# Patient Record
Sex: Female | Born: 1985 | Race: Black or African American | Hispanic: No | Marital: Single | Smoking: Former smoker
Health system: Southern US, Community
[De-identification: ages and names within clinical notes are randomized; demographics above are authoritative.]

## PROBLEM LIST (undated history)

## (undated) ENCOUNTER — Inpatient Hospital Stay (HOSPITAL_COMMUNITY): Payer: Self-pay

## (undated) DIAGNOSIS — R87629 Unspecified abnormal cytological findings in specimens from vagina: Secondary | ICD-10-CM

## (undated) DIAGNOSIS — Z8759 Personal history of other complications of pregnancy, childbirth and the puerperium: Secondary | ICD-10-CM

## (undated) DIAGNOSIS — IMO0002 Reserved for concepts with insufficient information to code with codable children: Secondary | ICD-10-CM

## (undated) DIAGNOSIS — D573 Sickle-cell trait: Secondary | ICD-10-CM

## (undated) DIAGNOSIS — R87619 Unspecified abnormal cytological findings in specimens from cervix uteri: Secondary | ICD-10-CM

## (undated) DIAGNOSIS — N83209 Unspecified ovarian cyst, unspecified side: Secondary | ICD-10-CM

## (undated) DIAGNOSIS — F32A Depression, unspecified: Secondary | ICD-10-CM

## (undated) DIAGNOSIS — Z86718 Personal history of other venous thrombosis and embolism: Secondary | ICD-10-CM

## (undated) DIAGNOSIS — N12 Tubulo-interstitial nephritis, not specified as acute or chronic: Secondary | ICD-10-CM

## (undated) DIAGNOSIS — D649 Anemia, unspecified: Secondary | ICD-10-CM

## (undated) DIAGNOSIS — F329 Major depressive disorder, single episode, unspecified: Secondary | ICD-10-CM

## (undated) DIAGNOSIS — Z8679 Personal history of other diseases of the circulatory system: Secondary | ICD-10-CM

## (undated) HISTORY — DX: Sickle-cell trait: D57.3

## (undated) HISTORY — DX: Tubulo-interstitial nephritis, not specified as acute or chronic: N12

## (undated) HISTORY — DX: Personal history of other diseases of the circulatory system: Z86.79

## (undated) HISTORY — PX: DILATION AND CURETTAGE OF UTERUS: SHX78

## (undated) HISTORY — DX: Personal history of other complications of pregnancy, childbirth and the puerperium: Z87.59

## (undated) HISTORY — PX: CERVIX LESION DESTRUCTION: SHX591

## (undated) HISTORY — DX: Depression, unspecified: F32.A

---
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
## 2003-07-03 ENCOUNTER — Inpatient Hospital Stay (HOSPITAL_COMMUNITY): Admission: AD | Admit: 2003-07-03 | Discharge: 2003-07-07 | Payer: Self-pay | Admitting: Obstetrics

## 2003-07-14 ENCOUNTER — Inpatient Hospital Stay (HOSPITAL_COMMUNITY): Admission: AD | Admit: 2003-07-14 | Discharge: 2003-07-14 | Payer: Self-pay | Admitting: Obstetrics

## 2005-04-14 ENCOUNTER — Inpatient Hospital Stay (HOSPITAL_COMMUNITY): Admission: AD | Admit: 2005-04-14 | Discharge: 2005-04-15 | Payer: Self-pay | Admitting: Obstetrics

## 2005-07-31 ENCOUNTER — Inpatient Hospital Stay (HOSPITAL_COMMUNITY): Admission: AD | Admit: 2005-07-31 | Discharge: 2005-08-02 | Payer: Self-pay | Admitting: Obstetrics

## 2006-06-08 ENCOUNTER — Emergency Department (HOSPITAL_COMMUNITY): Admission: EM | Admit: 2006-06-08 | Discharge: 2006-06-08 | Payer: Self-pay | Admitting: Emergency Medicine

## 2006-06-10 ENCOUNTER — Emergency Department (HOSPITAL_COMMUNITY): Admission: EM | Admit: 2006-06-10 | Discharge: 2006-06-10 | Payer: Self-pay | Admitting: Emergency Medicine

## 2008-10-11 ENCOUNTER — Inpatient Hospital Stay (HOSPITAL_COMMUNITY): Admission: AD | Admit: 2008-10-11 | Discharge: 2008-10-11 | Payer: Self-pay | Admitting: Obstetrics

## 2008-10-19 ENCOUNTER — Emergency Department (HOSPITAL_COMMUNITY): Admission: EM | Admit: 2008-10-19 | Discharge: 2008-10-19 | Payer: Self-pay | Admitting: Emergency Medicine

## 2008-12-02 ENCOUNTER — Emergency Department (HOSPITAL_COMMUNITY): Admission: EM | Admit: 2008-12-02 | Discharge: 2008-12-02 | Payer: Self-pay | Admitting: Emergency Medicine

## 2010-06-10 ENCOUNTER — Inpatient Hospital Stay (HOSPITAL_COMMUNITY): Admission: AD | Admit: 2010-06-10 | Discharge: 2010-06-10 | Payer: Self-pay | Admitting: Obstetrics

## 2010-06-10 ENCOUNTER — Ambulatory Visit: Payer: Self-pay | Admitting: Nurse Practitioner

## 2010-08-25 DIAGNOSIS — N83209 Unspecified ovarian cyst, unspecified side: Secondary | ICD-10-CM

## 2010-08-25 HISTORY — DX: Unspecified ovarian cyst, unspecified side: N83.209

## 2010-11-06 LAB — WET PREP, GENITAL: Trich, Wet Prep: NONE SEEN

## 2010-11-06 LAB — URINALYSIS, ROUTINE W REFLEX MICROSCOPIC
Bilirubin Urine: NEGATIVE
Glucose, UA: NEGATIVE mg/dL
Ketones, ur: 15 mg/dL — AB
Nitrite: NEGATIVE
Protein, ur: NEGATIVE mg/dL
Specific Gravity, Urine: 1.025 (ref 1.005–1.030)
Urobilinogen, UA: 0.2 mg/dL (ref 0.0–1.0)
pH: 5.5 (ref 5.0–8.0)

## 2010-11-06 LAB — URINE MICROSCOPIC-ADD ON

## 2010-11-06 LAB — DIFFERENTIAL
Basophils Absolute: 0 10*3/uL (ref 0.0–0.1)
Basophils Relative: 0 % (ref 0–1)
Eosinophils Absolute: 0 10*3/uL (ref 0.0–0.7)
Eosinophils Relative: 0 % (ref 0–5)
Lymphocytes Relative: 22 % (ref 12–46)
Lymphs Abs: 1.6 10*3/uL (ref 0.7–4.0)
Monocytes Absolute: 0.7 10*3/uL (ref 0.1–1.0)
Monocytes Relative: 9 % (ref 3–12)
Neutro Abs: 5 10*3/uL (ref 1.7–7.7)
Neutrophils Relative %: 69 % (ref 43–77)

## 2010-11-06 LAB — CBC
HCT: 33.8 % — ABNORMAL LOW (ref 36.0–46.0)
Hemoglobin: 11.4 g/dL — ABNORMAL LOW (ref 12.0–15.0)
MCH: 31.2 pg (ref 26.0–34.0)
MCHC: 33.6 g/dL (ref 30.0–36.0)
MCV: 92.8 fL (ref 78.0–100.0)
Platelets: 201 10*3/uL (ref 150–400)
RBC: 3.65 MIL/uL — ABNORMAL LOW (ref 3.87–5.11)
RDW: 14.9 % (ref 11.5–15.5)
WBC: 7.3 10*3/uL (ref 4.0–10.5)

## 2010-11-06 LAB — POCT PREGNANCY, URINE: Preg Test, Ur: NEGATIVE

## 2010-11-06 LAB — GC/CHLAMYDIA PROBE AMP, GENITAL
Chlamydia, DNA Probe: NEGATIVE
GC Probe Amp, Genital: NEGATIVE

## 2010-12-04 LAB — DIFFERENTIAL
Basophils Relative: 0 % (ref 0–1)
Eosinophils Absolute: 0 10*3/uL (ref 0.0–0.7)
Monocytes Relative: 6 % (ref 3–12)
Neutro Abs: 10.4 10*3/uL — ABNORMAL HIGH (ref 1.7–7.7)
Neutrophils Relative %: 87 % — ABNORMAL HIGH (ref 43–77)

## 2010-12-04 LAB — CBC
MCHC: 33.5 g/dL (ref 30.0–36.0)
MCV: 87.7 fL (ref 78.0–100.0)
Platelets: 245 10*3/uL (ref 150–400)
RBC: 3.64 MIL/uL — ABNORMAL LOW (ref 3.87–5.11)
WBC: 11.9 10*3/uL — ABNORMAL HIGH (ref 4.0–10.5)

## 2010-12-04 LAB — URINALYSIS, ROUTINE W REFLEX MICROSCOPIC
Bilirubin Urine: NEGATIVE
Glucose, UA: NEGATIVE mg/dL
Nitrite: POSITIVE — AB
Protein, ur: 100 mg/dL — AB
Specific Gravity, Urine: 1.011 (ref 1.005–1.030)
Urobilinogen, UA: 1 mg/dL (ref 0.0–1.0)
pH: 6 (ref 5.0–8.0)

## 2010-12-04 LAB — URINE MICROSCOPIC-ADD ON

## 2010-12-04 LAB — POCT I-STAT, CHEM 8
Chloride: 102 mEq/L (ref 96–112)
HCT: 34 % — ABNORMAL LOW (ref 36.0–46.0)
Hemoglobin: 11.6 g/dL — ABNORMAL LOW (ref 12.0–15.0)
Potassium: 3.4 mEq/L — ABNORMAL LOW (ref 3.5–5.1)
Sodium: 136 mEq/L (ref 135–145)

## 2010-12-04 LAB — URINE CULTURE: Colony Count: >=100000

## 2010-12-04 LAB — POCT PREGNANCY, URINE: Preg Test, Ur: NEGATIVE

## 2010-12-10 LAB — URINALYSIS, ROUTINE W REFLEX MICROSCOPIC
Bilirubin Urine: NEGATIVE
Hgb urine dipstick: NEGATIVE
Ketones, ur: NEGATIVE mg/dL
Nitrite: NEGATIVE
Protein, ur: NEGATIVE mg/dL
Specific Gravity, Urine: 1.02 (ref 1.005–1.030)
Specific Gravity, Urine: 1.013 (ref 1.005–1.030)
Urobilinogen, UA: 0.2 mg/dL (ref 0.0–1.0)
Urobilinogen, UA: 1 mg/dL (ref 0.0–1.0)
pH: 6.5 (ref 5.0–8.0)

## 2010-12-10 LAB — CBC
HCT: 33.8 % — ABNORMAL LOW (ref 36.0–46.0)
HCT: 37.1 % (ref 36.0–46.0)
MCHC: 32.5 g/dL (ref 30.0–36.0)
MCHC: 32.8 g/dL (ref 30.0–36.0)
MCV: 89.3 fL (ref 78.0–100.0)
MCV: 88.6 fL (ref 78.0–100.0)
Platelets: 226 10*3/uL (ref 150–400)
RBC: 4.19 MIL/uL (ref 3.87–5.11)
RDW: 16.2 % — ABNORMAL HIGH (ref 11.5–15.5)
WBC: 6.7 10*3/uL (ref 4.0–10.5)
WBC: 8 10*3/uL (ref 4.0–10.5)

## 2010-12-10 LAB — LIPASE, BLOOD: Lipase: 18 U/L (ref 11–59)

## 2010-12-10 LAB — DIFFERENTIAL
Basophils Absolute: 0 10*3/uL (ref 0.0–0.1)
Basophils Relative: 0 % (ref 0–1)
Eosinophils Absolute: 0 10*3/uL (ref 0.0–0.7)
Eosinophils Relative: 0 % (ref 0–5)
Lymphocytes Relative: 23 % (ref 12–46)
Lymphs Abs: 2 10*3/uL (ref 0.7–4.0)
Neutro Abs: 5.9 10*3/uL (ref 1.7–7.7)
Neutrophils Relative %: 62 % (ref 43–77)
Neutrophils Relative %: 73 % (ref 43–77)

## 2010-12-10 LAB — GC/CHLAMYDIA PROBE AMP, GENITAL: GC Probe Amp, Genital: NEGATIVE

## 2010-12-10 LAB — COMPREHENSIVE METABOLIC PANEL
BUN: 6 mg/dL (ref 6–23)
CO2: 19 mEq/L (ref 19–32)
Chloride: 104 mEq/L (ref 96–112)
Creatinine, Ser: 0.71 mg/dL (ref 0.4–1.2)
GFR calc non Af Amer: >60 mL/min (ref >60)
Total Bilirubin: 0.9 mg/dL (ref 0.3–1.2)

## 2010-12-10 LAB — WET PREP, GENITAL
Trich, Wet Prep: NONE SEEN
Yeast Wet Prep HPF POC: NONE SEEN

## 2010-12-10 LAB — POCT PREGNANCY, URINE: Preg Test, Ur: NEGATIVE

## 2011-01-10 NOTE — H&P (Signed)
   NAME:  Linda Duffy, Linda Duffy                        ACCOUNT NO.:  [id number]   MEDICAL RECORD NO.:  [id number]                   PATIENT TYPE:  INP   LOCATION:  9113                                 FACILITY:  [facility]   PHYSICIAN:  Kathreen Cosier, M.D.           DATE OF BIRTH:  18-Aug-1986   DATE OF ADMISSION:  07/03/2003  DATE OF DISCHARGE:                                HISTORY & PHYSICAL   REASON FOR ADMISSION:  The patient is an 25 year old primigravida, [name]  June 30, 2003 who was admitted for induction.  Negative GBS.  Cervix 1  cm, 60%, vertex, -3.  She was contracting every four minutes.  She was not  feeling them.   She was started on low dose Pitocin on 12:30 p.m. on November 8.  At 4:45  p.m. she was on 14 milliunits of Pitocin, not feeling her contractions.  She  was contracting every two minutes.  She had a reactive tracing.  Membranes  were ruptured artificially.  The cervix was 2-3 cm, 70%, vertex -3.  By 8  p.m. she was 4-5 cm, 100%, contracting every two to three minutes, and the  vertex was -3 station.  By 11 p.m. cervix was still 4-5.  She was  contracting every two to three minutes with adequate contractions and the  cervix was unchanged.  By 1:35 a.m. she was still 4-5 cm with the vertex -3  with molding and it was decided she would be delivered by cesarean section  with nonreassuring fetal heart tracing.   While waiting in the operating room she received terbutaline 0.25  subcutaneous and Amnioinfusion ordered if necessary because she was having  variable decelerations.   PHYSICAL EXAMINATION:  GENERAL:  Well-developed female in labor.  HEENT:  Negative.  LUNGS:  Clear.  HEART:  Regular rhythm.  No murmurs.  No gallops.  ABDOMEN:  Term size.  Estimated weight was 6 pounds 12 ounces.  PELVIC:  Gravid.  EXTREMITIES:  Lower extremities negative.   ADDENDUM:  By 3 a.m. the patient's temperature was 101 and she was started  on ampicillin 2 g IV q.6h.                                           Kathreen Cosier, M.D.    BAM/MEDQ  D:  07/04/2003  T:  07/04/2003  Job:  [id number]

## 2011-01-10 NOTE — Discharge Summary (Signed)
   NAME:  Linda Duffy, Linda Duffy                        ACCOUNT NO.:  [id number]   MEDICAL RECORD NO.:  [id number]                   PATIENT TYPE:  INP   LOCATION:  9113                                 [facility]  [facility]   PHYSICIAN:  Kathreen Cosier, M.D.           DATE OF BIRTH:  06/10/1986   DATE OF ADMISSION:  07/03/2003  DATE OF DISCHARGE:                                 DISCHARGE SUMMARY   HISTORY OF PRESENT ILLNESS:  The patient is an 25 year old primigravida, [facility]  June 30, 2003 who was admitted for induction at the patient's request.  Estimated fetal weight of 6 pounds 12 ounces.  She was 1 cm, 60% with the  vertex at -3 station.  She received low dose Pitocin and was in adequate  labor for greater than six hours.  She progressed to 4-5 cm with the vertex  at -3 station and underwent primary low transverse cesarean section because  of failure to progress.  She also spiked a temperature of 101 and was  treated with ampicillin prior to cesarean section.  She underwent a primary  low transverse cesarean section and had a female Apgar 8/9 weighing 7 pounds  5 ounces from the OP position.  Postoperatively she did well.  She was  discharged on the third postoperative day, ambulatory, on a regular diet.  Her hemoglobin was 11.3.  She was discharged on Micronor and Tylox to see me  in six weeks.   DISCHARGE DIAGNOSES:  Status post primary low transverse cesarean section at  term for failure to progress in labor.                                               Kathreen Cosier, M.D.    BAM/MEDQ  D:  07/07/2003  T:  07/07/2003  Job:  [id number]

## 2011-01-10 NOTE — Discharge Summary (Signed)
Linda Duffy, GILLOOLY                 ACCOUNT NO.:  [id number]   MEDICAL RECORD NO.:  [id number]          PATIENT TYPE:  INP   LOCATION:  9303                          [facility]  [facility]   PHYSICIAN:  Kathreen Cosier, M.D.DATE OF BIRTH:  1985-10-22   DATE OF ADMISSION:  07/31/2005  DATE OF DISCHARGE:  08/02/2005                                 DISCHARGE SUMMARY   Patient is a 25 year old gravida 2, para 1-0-1-1 on Depo Provera  contraception.  She was admitted because of right upper quadrant pain.  She  had been on ampicillin and Tylenol No.3 and she had on admission a positive  urine.  She also had nausea, vomiting on admission.  She was started on IV  ampicillin and gentamicin and she remained afebrile and her pain subsided.  By December 9 her CVA tenderness was negative.  Abdomen was soft, nontender.  White count 7.9.  Urine 7-10 white blood cells, 7-12 red blood cells.  GC,  Chlamydia negative.  Hemoglobin 12.1.  She was discharged home on December 9  on Cleocin 300 p.o. q.6h. and Tylox for pain to see me in two weeks.   DISCHARGE DIAGNOSES:  Status post hospitalization for pyelonephritis.           ______________________________  Kathreen Cosier, M.D.     BAM/MEDQ  D:  08/20/2005  T:  08/20/2005  Job:  [id number]

## 2011-01-10 NOTE — Op Note (Signed)
   NAME:  Linda Duffy, Linda Duffy                        ACCOUNT NO.:  [id number]   MEDICAL RECORD NO.:  [id number]                   PATIENT TYPE:  INP   LOCATION:  9113                                 [facility]  [facility]   PHYSICIAN:  Kathreen Cosier, M.D.           DATE OF BIRTH:  [birth year]   DATE OF PROCEDURE:  07/04/2003  DATE OF DISCHARGE:                                 OPERATIVE REPORT   PREOPERATIVE DIAGNOSIS:  Failure to progress in labor.   SURGEON:  Kathreen Cosier, M.D.   ANESTHESIA:  Epidural.   DESCRIPTION OF PROCEDURE:  The patient was placed on the operating table in  supine position.  Abdomen prepped and draped, bladder emptied with a Foley  catheter.  A transverse suprapubic incision made and carried down to the  rectus fascia.  The fascia cleaned and incised the length of the incision.  The recti muscles retracted laterally, the peritoneum incised  longitudinally.  A transverse incision made in the visceral peritoneum above  the bladder and the bladder mobilized inferiorly.  A transverse lower  uterine incision made.  The patient delivered from the OP position of a  female, Apgar 8 and 9, weighing 7 pounds 5 ounces.  The team was in  attendance.  There was a nuchal cord reduced before delivery.  The placenta  was anterior, removed manually, and the uterine cavity cleaned with dry  laps.  The uterine incision closed in one layer with continuous suture of #1  chromic.  Hemostasis was satisfactory.  The bladder flap reattached with 2-0  chromic.  The uterus was well-contracted, tubes and ovaries normal.  Abdomen  closed in layers, the peritoneum with continuous suture of 0 chromic, the  fascia with continuous suture of 0 Dexon, and the skin closed with  subcuticular stitch of 3-0 Monocryl.  Blood loss 500 mL.  The patient  tolerated the procedure well, was taken to the recovery room in good  condition.                                               Kathreen Cosier, M.D.    BAM/MEDQ  D:  07/04/2003  T:  07/04/2003  Job:  [id number]

## 2011-04-11 ENCOUNTER — Inpatient Hospital Stay (HOSPITAL_COMMUNITY): Payer: Self-pay

## 2011-04-11 ENCOUNTER — Encounter (HOSPITAL_COMMUNITY): Payer: Self-pay | Admitting: *Deleted

## 2011-04-11 ENCOUNTER — Inpatient Hospital Stay (HOSPITAL_COMMUNITY)
Admission: AD | Admit: 2011-04-11 | Discharge: 2011-04-11 | Disposition: A | Discharge status: Home / Self Care | Payer: Self-pay | Source: Ambulatory Visit | Attending: Obstetrics & Gynecology | Admitting: Obstetrics & Gynecology

## 2011-04-11 DIAGNOSIS — N390 Urinary tract infection, site not specified: Secondary | ICD-10-CM | POA: Diagnosis present

## 2011-04-11 DIAGNOSIS — Z3490 Encounter for supervision of normal pregnancy, unspecified, unspecified trimester: Secondary | ICD-10-CM

## 2011-04-11 DIAGNOSIS — N760 Acute vaginitis: Secondary | ICD-10-CM | POA: Diagnosis present

## 2011-04-11 DIAGNOSIS — B9689 Other specified bacterial agents as the cause of diseases classified elsewhere: Secondary | ICD-10-CM | POA: Insufficient documentation

## 2011-04-11 DIAGNOSIS — M549 Dorsalgia, unspecified: Secondary | ICD-10-CM | POA: Insufficient documentation

## 2011-04-11 DIAGNOSIS — Z3480 Encounter for supervision of other normal pregnancy, unspecified trimester: Secondary | ICD-10-CM

## 2011-04-11 DIAGNOSIS — O2390 Unspecified genitourinary tract infection in pregnancy, unspecified trimester: Secondary | ICD-10-CM | POA: Insufficient documentation

## 2011-04-11 DIAGNOSIS — R1031 Right lower quadrant pain: Secondary | ICD-10-CM | POA: Insufficient documentation

## 2011-04-11 DIAGNOSIS — A499 Bacterial infection, unspecified: Secondary | ICD-10-CM | POA: Insufficient documentation

## 2011-04-11 HISTORY — DX: Unspecified ovarian cyst, unspecified side: N83.209

## 2011-04-11 HISTORY — DX: Unspecified abnormal cytological findings in specimens from cervix uteri: R87.619

## 2011-04-11 HISTORY — DX: Reserved for concepts with insufficient information to code with codable children: IMO0002

## 2011-04-11 LAB — URINE MICROSCOPIC-ADD ON

## 2011-04-11 LAB — POCT PREGNANCY, URINE: Preg Test, Ur: POSITIVE

## 2011-04-11 LAB — CBC
MCV: 91.6 fL (ref 78.0–100.0)
Platelets: 157 10*3/uL (ref 150–400)
RDW: 13.8 % (ref 11.5–15.5)
WBC: 8.5 10*3/uL (ref 4.0–10.5)

## 2011-04-11 LAB — URINALYSIS, ROUTINE W REFLEX MICROSCOPIC
Protein, ur: 100 mg/dL — AB
Urobilinogen, UA: 0.2 mg/dL (ref 0.0–1.0)

## 2011-04-11 LAB — WET PREP, GENITAL

## 2011-04-11 MED ORDER — METRONIDAZOLE 500 MG PO TABS: 500.0000 mg | ORAL_TABLET | Freq: Two times a day (BID) | ORAL | Status: AC

## 2011-04-11 MED ORDER — CEFTRIAXONE SODIUM 1 G IJ SOLR
500.0000 mg | Freq: Once | INTRAMUSCULAR | Status: DC
  Filled 2011-04-11: qty 1

## 2011-04-11 MED ORDER — CEPHALEXIN 500 MG PO CAPS: 500.0000 mg | ORAL_CAPSULE | Freq: Three times a day (TID) | ORAL | Status: AC

## 2011-04-11 MED ORDER — CEFTRIAXONE SODIUM 1 G IJ SOLR
1.0000 g | Freq: Once | INTRAMUSCULAR | Status: AC
  Administered 2011-04-11: 1 g via INTRAMUSCULAR

## 2011-04-11 MED ORDER — HYDROMORPHONE HCL 1 MG/ML IJ SOLN
1.0000 mg | Freq: Once | INTRAMUSCULAR | Status: AC
  Administered 2011-04-11: 1 mg via INTRAMUSCULAR
  Filled 2011-04-11: qty 1

## 2011-04-11 MED ORDER — PHENAZOPYRIDINE HCL 200 MG PO TABS: 200.0000 mg | ORAL_TABLET | Freq: Three times a day (TID) | ORAL | Status: AC | PRN

## 2011-04-11 NOTE — Progress Notes (Signed)
Waiting on pt to have pain meds before taking to [place].

## 2011-04-11 NOTE — Progress Notes (Signed)
PT SAYS CYCLES ARE VERY IRREG

## 2011-04-11 NOTE — Progress Notes (Signed)
Pt presents to [facility] for c/o pain in lower right side.  States also some back pain.  States she drives a stick shift and was unable to even drive to get here.  Started around 10pm

## 2011-04-11 NOTE — Progress Notes (Signed)
N. Frazier, CNM at bedside.  Assessment done and poc discussed with pt.  

## 2011-04-11 NOTE — ED Provider Notes (Signed)
History     Chief Complaint  Patient presents with  . Back Pain   HPI  Linda Duffy  is a 25 y.o. [id number] presenting with RLQ and back pain with sudden onset tonight. No vaginal bleeding or discharge. Irregular periods. LMP May. Sexually active.   OB History    Grav Para Term Preterm Abortions TAB SAB Ect Mult Living   3 1 1  2 2    1       Past Medical History  Diagnosis Date  . Ovarian cyst   . Abnormal Pap smear     Past Surgical History  Procedure Date  . No past surgeries     No family history on file.  History  Substance Use Topics  . Smoking status: Current Everyday Smoker -- 0.2 packs/day  . Smokeless tobacco: Not on file  . Alcohol Use: Yes    Allergies: No Known Allergies  No prescriptions prior to admission    Review of Systems  Constitutional: Negative.   Respiratory: Negative.   Cardiovascular: Negative.   Gastrointestinal: Positive for abdominal pain. Negative for nausea, vomiting, diarrhea and constipation.  Genitourinary: Positive for urgency and flank pain. Negative for dysuria, frequency and hematuria.       Negative for vaginal bleeding, cramping/contractions  Musculoskeletal: Negative for back pain.  Neurological: Negative.   Psychiatric/Behavioral: Negative.    Physical Exam   Blood pressure 111/48, pulse 73, temperature 98.1 F (36.7 C), temperature source Oral, resp. rate 18, height 5\' 5"  (1.651 m), weight 57.834 kg (127 lb 8 oz), last menstrual period 01/04/2011.  Physical Exam  Constitutional: She appears well-developed and well-nourished. She appears distressed.  Cardiovascular: Normal rate.   Respiratory: Effort normal.  GI: Soft. She exhibits no distension and no mass. There is tenderness. There is guarding and CVA tenderness (right). There is no rebound.  Genitourinary: There is no rash or lesion on the right labia. There is no rash or lesion on the left labia. Uterus is enlarged (difficult to examine d/t gaurding and pt  discomfort, ? 16-17 week size). Right adnexum displays tenderness. Left adnexum displays no tenderness. No tenderness or bleeding around the vagina. No vaginal discharge found.    MAU Course  Procedures  Results for orders placed during the hospital encounter of 04/11/11 (from the past 24 hour(s))  URINALYSIS, ROUTINE W REFLEX MICROSCOPIC     Status: Abnormal   Collection Time   04/11/11  1:57 AM      Component Value Range   Color, Urine YELLOW  YELLOW    Appearance CLEAR  CLEAR    Specific Gravity, Urine 1.020  1.005 - 1.030    pH 6.0  5.0 - 8.0    Glucose, UA NEGATIVE  NEGATIVE (mg/dL)   Hgb urine dipstick LARGE (*) NEGATIVE    Bilirubin Urine NEGATIVE  NEGATIVE    Ketones, ur NEGATIVE  NEGATIVE (mg/dL)   Protein, ur 454 (*) NEGATIVE (mg/dL)   Urobilinogen, UA 0.2  0.0 - 1.0 (mg/dL)   Nitrite POSITIVE (*) NEGATIVE    Leukocytes, UA MODERATE (*) NEGATIVE   URINE MICROSCOPIC-ADD ON     Status: Abnormal   Collection Time   04/11/11  1:57 AM      Component Value Range   Squamous Epithelial / LPF FEW (*) RARE    WBC, UA TOO NUMEROUS TO COUNT  <3 (WBC/hpf)   RBC / HPF TOO NUMEROUS TO COUNT  <3 (RBC/hpf)   Bacteria, UA MANY (*) RARE   POCT  PREGNANCY, URINE     Status: Normal   Collection Time   04/11/11  2:10 AM      Component Value Range   Preg Test, Ur POSITIVE    CBC     Status: Abnormal   Collection Time   04/11/11  2:25 AM      Component Value Range   WBC 8.5  4.0 - 10.5 (K/uL)   RBC 2.98 (*) 3.87 - 5.11 (MIL/uL)   Hemoglobin 9.4 (*) 12.0 - 15.0 (g/dL)   HCT 40.9 (*) 81.1 - 46.0 (%)   MCV 91.6  78.0 - 100.0 (fL)   MCH 31.5  26.0 - 34.0 (pg)   MCHC 34.4  30.0 - 36.0 (g/dL)   RDW 91.4  78.2 - 95.6 (%)   Platelets 157  150 - 400 (K/uL)  ABO/RH     Status: Normal   Collection Time   04/11/11  2:25 AM      Component Value Range   ABO/RH(D) O POS    HCG, QUANTITATIVE, PREGNANCY     Status: Abnormal   Collection Time   04/11/11  2:25 AM      Component Value Range   hCG,  Beta Chain, Quant, S 21308 (*) <5 (mIU/mL)  WET PREP, GENITAL     Status: Abnormal   Collection Time   04/11/11  2:55 AM      Component Value Range   Yeast, Wet Prep NONE SEEN  NONE SEEN    Trich, Wet Prep NONE SEEN  NONE SEEN    Clue Cells, Wet Prep MODERATE (*) NONE SEEN    WBC, Wet Prep HPF POC FEW (*) NONE SEEN    Urine culture pending  Consulted with Dr. Marice Potter - ok to d/c to home with pyelo precautions  Assessment and Plan  25 y.o. G4P1021 at 16.[redacted] wks EGA UTI - Rocephin IM in MAU, rx Keflex and pyridium BV - rx Flagyl Start prenatal care ASAP  [facility] 04/11/2011, 4:09 AM

## 2011-04-11 NOTE — Progress Notes (Signed)
SSE done per Telford Nab, CNM.  VE done.

## 2011-04-11 NOTE — Progress Notes (Signed)
SAYS PAIN STARTED AT 1030 PM TONIGHT -  PAIN IS LOWER R SSIDE  AND BACK- PUT HOT COMPRESS AND COLD- DID NOT HELP. DID NOT TAKE ANY MED.

## 2011-04-12 LAB — GC/CHLAMYDIA PROBE AMP, GENITAL: GC Probe Amp, Genital: NEGATIVE

## 2012-05-19 ENCOUNTER — Encounter (HOSPITAL_COMMUNITY): Payer: Self-pay | Admitting: *Deleted

## 2012-05-19 ENCOUNTER — Inpatient Hospital Stay (HOSPITAL_COMMUNITY)
Admission: AD | Admit: 2012-05-19 | Discharge: 2012-05-20 | Disposition: A | Discharge status: Home / Self Care | Payer: Self-pay | Source: Ambulatory Visit | Attending: Obstetrics & Gynecology | Admitting: Obstetrics & Gynecology

## 2012-05-19 DIAGNOSIS — N760 Acute vaginitis: Secondary | ICD-10-CM | POA: Insufficient documentation

## 2012-05-19 DIAGNOSIS — R109 Unspecified abdominal pain: Secondary | ICD-10-CM | POA: Insufficient documentation

## 2012-05-19 DIAGNOSIS — A499 Bacterial infection, unspecified: Secondary | ICD-10-CM | POA: Insufficient documentation

## 2012-05-19 DIAGNOSIS — R55 Syncope and collapse: Secondary | ICD-10-CM | POA: Insufficient documentation

## 2012-05-19 DIAGNOSIS — N946 Dysmenorrhea, unspecified: Secondary | ICD-10-CM | POA: Insufficient documentation

## 2012-05-19 DIAGNOSIS — B9689 Other specified bacterial agents as the cause of diseases classified elsewhere: Secondary | ICD-10-CM | POA: Insufficient documentation

## 2012-05-19 HISTORY — DX: Anemia, unspecified: D64.9

## 2012-05-19 LAB — URINALYSIS, ROUTINE W REFLEX MICROSCOPIC
Ketones, ur: NEGATIVE mg/dL
Leukocytes, UA: NEGATIVE
Nitrite: NEGATIVE
Specific Gravity, Urine: 1.01 (ref 1.005–1.030)
pH: 7 (ref 5.0–8.0)

## 2012-05-19 LAB — CBC
Platelets: 182 10*3/uL (ref 150–400)
RBC: 3.68 MIL/uL — ABNORMAL LOW (ref 3.87–5.11)
WBC: 7.6 10*3/uL (ref 4.0–10.5)

## 2012-05-19 MED ORDER — SODIUM CHLORIDE 0.9 % IV SOLN
1000.0000 mL | Freq: Once | INTRAVENOUS | Status: AC
  Administered 2012-05-20: 1000 mL via INTRAVENOUS

## 2012-05-19 NOTE — MAU Provider Note (Signed)
History     CSN: [id number]  Arrival date and time: 05/19/12 2158   None     Chief Complaint  Patient presents with  . Loss of Consciousness   HPI Linda Duffy is a 26 y.o. female who presents to MAU with syncopal episodes. The first episode was 2 weeks ago. She states she was getting ready to get something to drink and felt dizzy and passed out. Was fine after that. Thought she waited to long to eat. The next episode was about a week ago while driving felt hot and sweaty and pulled in to a gas station but did not pass out. Got water and sat for a while then felt ok. For the past 3 days has had a headache and felt dizzy like may pass out again so decided to come in. Also complains of lower abdominal cramping. Period due Oct. 1 st. Not on any birth control. Current sex partner x 1 year. No history of STI's. The history was provided by the patient.   OB History    Grav Para Term Preterm Abortions TAB SAB Ect Mult Living   3 1 1  2 2    1       Past Medical History  Diagnosis Date  . Ovarian cyst   . Abnormal Pap smear   . Anemia     Past Surgical History  Procedure Date  . Cesarean section     Family History  Problem Relation Age of Onset  . Diabetes Mother   . Hypertension Mother     History  Substance Use Topics  . Smoking status: Former Smoker -- 0.2 packs/day    Quit date: 05/19/2012  . Smokeless tobacco: Not on file  . Alcohol Use: Yes    Allergies: No Known Allergies  No prescriptions prior to admission    Review of Systems  Constitutional: Negative for fever, chills and weight loss.  HENT: Negative for ear pain, nosebleeds, congestion, sore throat and neck pain.   Eyes: Negative for blurred vision, double vision, photophobia and pain.  Respiratory: Negative for cough, shortness of breath and wheezing.   Cardiovascular: Negative for chest pain, palpitations and leg swelling.  Gastrointestinal: Positive for abdominal pain. Negative for heartburn, nausea,  vomiting, diarrhea and constipation.  Genitourinary: Negative for dysuria, urgency and frequency.  Musculoskeletal: Negative for myalgias and back pain.  Skin: Negative for itching and rash.  Neurological: Positive for dizziness and headaches. Negative for sensory change, speech change, seizures and weakness.  Endo/Heme/Allergies: Does not bruise/bleed easily.  Psychiatric/Behavioral: Negative for depression. The patient is not nervous/anxious and does not have insomnia.    Physical Exam   Blood pressure 112/69, pulse 81, resp. rate 18, last menstrual period 04/18/2012.  Physical Exam  Nursing note and vitals reviewed. Constitutional: She is oriented to person, place, and time. She appears well-developed and well-nourished. No distress.  HENT:  Head: Normocephalic and atraumatic.  Eyes: EOM are normal. Right eye exhibits no nystagmus. Left eye exhibits no nystagmus.  Neck: Neck supple.  Cardiovascular: Normal rate and regular rhythm.   No murmur heard. Respiratory: Effort normal and breath sounds normal.  GI: Soft. There is no tenderness.  Genitourinary:       External genitalia without lesions. Frothy malodorous discharge vaginal vault. No CMT, no adnexal tenderness, Uterus without palpable enlargement.  Musculoskeletal: Normal range of motion.  Neurological: She is alert and oriented to person, place, and time. She has normal strength. No cranial nerve deficit or sensory deficit.  She displays a negative Romberg sign. Coordination and gait normal.  Reflex Scores:      Bicep reflexes are 2+ on the right side and 2+ on the left side.      Brachioradialis reflexes are 2+ on the right side and 2+ on the left side.      Patellar reflexes are 2+ on the right side and 2+ on the left side. Skin: Skin is warm and dry.  Psychiatric: She has a normal mood and affect. Her behavior is normal. Judgment and thought content normal.   Results for orders placed during the hospital encounter of  05/19/12 (from the past 24 hour(s))  URINALYSIS, ROUTINE W REFLEX MICROSCOPIC     Status: Normal   Collection Time   05/19/12 10:03 PM      Component Value Range   Color, Urine YELLOW  YELLOW   APPearance CLEAR  CLEAR   Specific Gravity, Urine 1.010  1.005 - 1.030   pH 7.0  5.0 - 8.0   Glucose, UA NEGATIVE  NEGATIVE mg/dL   Hgb urine dipstick NEGATIVE  NEGATIVE   Bilirubin Urine NEGATIVE  NEGATIVE   Ketones, ur NEGATIVE  NEGATIVE mg/dL   Protein, ur NEGATIVE  NEGATIVE mg/dL   Urobilinogen, UA 0.2  0.0 - 1.0 mg/dL   Nitrite NEGATIVE  NEGATIVE   Leukocytes, UA NEGATIVE  NEGATIVE  CBC     Status: Abnormal   Collection Time   05/19/12 10:50 PM      Component Value Range   WBC 7.6  4.0 - 10.5 K/uL   RBC 3.68 (*) 3.87 - 5.11 MIL/uL   Hemoglobin 11.4 (*) 12.0 - 15.0 g/dL   HCT 16.1 (*) 09.6 - 04.5 %   MCV 91.6  78.0 - 100.0 fL   MCH 31.0  26.0 - 34.0 pg   MCHC 33.8  30.0 - 36.0 g/dL   RDW 40.9  81.1 - 91.4 %   Platelets 182  150 - 400 K/uL  POCT PREGNANCY, URINE     Status: Normal   Collection Time   05/19/12 10:50 PM      Component Value Range   Preg Test, Ur NEGATIVE  NEGATIVE  WET PREP, GENITAL     Status: Abnormal   Collection Time   05/20/12  1:06 AM      Component Value Range   Yeast Wet Prep HPF POC FEW (*) NONE SEEN   Trich, Wet Prep NONE SEEN  NONE SEEN   Clue Cells Wet Prep HPF POC MODERATE (*) NONE SEEN   WBC, Wet Prep HPF POC FEW (*) NONE SEEN  GLUCOSE, CAPILLARY     Status: Normal   Collection Time   05/20/12  1:48 AM      Component Value Range   Glucose-Capillary 95  70 - 99 mg/dL   EKG: {ekg [id number]::"QIONGE EKG, normal sinus rhythm",rate 66 Procedures  @ 02: 40 am the patient states she is feeling better after IV fluids. She states she does not feel dizzy and her headache is gone.  Assessment: 26 y.o. female with syncopal episode by history   Abdominal cramping/dysmenorrhea   Bacterial vaginosis  Plan:  Discussed in detail with the patient lab,  EKG and clinical findings. Discussed need for   small meals more frequently and protein in diet and need for follow up with PCP for   complete physical exam.  If symptoms return before then to go to [facility] or [facility] ED.   Treat BV   Medication  List     As of 05/20/2012  2:44 AM    START taking these medications         metroNIDAZOLE 500 MG tablet   Commonly known as: FLAGYL   Take 1 tablet (500 mg total) by mouth 2 (two) times daily.          Where to get your medications    These are the prescriptions that you need to pick up.   You may get these medications from any pharmacy.         metroNIDAZOLE 500 MG tablet              NEESE,HOPE, RN, FNP, BC 05/20/2012, 2:01 AM

## 2012-05-19 NOTE — MAU Note (Signed)
Pt states she has been passing out for the past 2 wks. Pt describes  the sound leaving around her  And falling  Down or slumping over

## 2012-05-20 LAB — WET PREP, GENITAL

## 2012-05-20 MED ORDER — METRONIDAZOLE 500 MG PO TABS: 500.0000 mg | ORAL_TABLET | Freq: Two times a day (BID) | ORAL | Status: DC

## 2012-05-21 LAB — GC/CHLAMYDIA PROBE AMP, GENITAL
Chlamydia, DNA Probe: NEGATIVE
GC Probe Amp, Genital: NEGATIVE

## 2012-09-18 ENCOUNTER — Inpatient Hospital Stay (HOSPITAL_COMMUNITY): Payer: Self-pay

## 2012-09-18 ENCOUNTER — Encounter (HOSPITAL_COMMUNITY): Payer: Self-pay | Admitting: *Deleted

## 2012-09-18 ENCOUNTER — Inpatient Hospital Stay (HOSPITAL_COMMUNITY)
Admission: AD | Admit: 2012-09-18 | Discharge: 2012-09-19 | Disposition: A | Discharge status: Home / Self Care | Payer: Self-pay | Source: Ambulatory Visit | Attending: Obstetrics and Gynecology | Admitting: Obstetrics and Gynecology

## 2012-09-18 DIAGNOSIS — R109 Unspecified abdominal pain: Secondary | ICD-10-CM | POA: Insufficient documentation

## 2012-09-18 DIAGNOSIS — N949 Unspecified condition associated with female genital organs and menstrual cycle: Secondary | ICD-10-CM | POA: Insufficient documentation

## 2012-09-18 DIAGNOSIS — B9689 Other specified bacterial agents as the cause of diseases classified elsewhere: Secondary | ICD-10-CM | POA: Insufficient documentation

## 2012-09-18 DIAGNOSIS — A499 Bacterial infection, unspecified: Secondary | ICD-10-CM | POA: Insufficient documentation

## 2012-09-18 DIAGNOSIS — N760 Acute vaginitis: Secondary | ICD-10-CM | POA: Insufficient documentation

## 2012-09-18 LAB — WET PREP, GENITAL
Trich, Wet Prep: NONE SEEN
Yeast Wet Prep HPF POC: NONE SEEN

## 2012-09-18 LAB — URINALYSIS, ROUTINE W REFLEX MICROSCOPIC
Hgb urine dipstick: NEGATIVE
Leukocytes, UA: NEGATIVE
Nitrite: NEGATIVE
Specific Gravity, Urine: >1.03 — ABNORMAL HIGH (ref 1.005–1.030)
Urobilinogen, UA: 0.2 mg/dL (ref 0.0–1.0)

## 2012-09-18 LAB — POCT PREGNANCY, URINE: Preg Test, Ur: NEGATIVE

## 2012-09-18 NOTE — MAU Note (Signed)
I'm having stomach pains, rib pain on R side, a lot of pelvic pressure. Was here few months ago and told was not pregnant.

## 2012-09-18 NOTE — MAU Provider Note (Signed)
History     CSN: [id number]  Arrival date and time: 09/18/12 2208   First Provider Initiated Contact with Patient 09/18/12 2305      Chief Complaint  Patient presents with  . Abdominal Pain   HPI  Pt is here with report of lower right and midpelvic pain that started two weeks ago.  No report of abnormal vaginal discharge or odor.  Patient's last menstrual period was 09/08/2012.  "Feels heavy" in lower pelvis.  No report of nausea or vomiting.  Pain is intermittent and does not have any aggravating factors.  Pain worsens right before cycle.     Past Medical History  Diagnosis Date  . Ovarian cyst 2012  . Abnormal Pap smear   . Anemia     Past Surgical History  Procedure Date  . Cesarean section     Family History  Problem Relation Age of Onset  . Diabetes Mother   . Hypertension Mother     History  Substance Use Topics  . Smoking status: Current Some Day Smoker -- 0.2 packs/day    Last Attempt to Quit: 05/19/2012  . Smokeless tobacco: Not on file  . Alcohol Use: No    Allergies: No Known Allergies  Prescriptions prior to admission  Medication Sig Dispense Refill  . metroNIDAZOLE (FLAGYL) 500 MG tablet Take 1 tablet (500 mg total) by mouth 2 (two) times daily.  14 tablet  0    Review of Systems  Gastrointestinal: Positive for abdominal pain (lower pelvic pain.  ).   Physical Exam   Blood pressure 132/70, pulse 84, temperature 98 F (36.7 C), temperature source Oral, resp. rate 20, height 5\' 7"  (1.702 m), weight 60.147 kg (132 lb 9.6 oz), last menstrual period 09/08/2012, SpO2 100.00%.  Physical Exam  Constitutional: She is oriented to person, place, and time. She appears well-developed and well-nourished. No distress.       Appears uncomfortable  HENT:  Head: Normocephalic.  Eyes: Pupils are equal, round, and reactive to light.  Neck: Normal range of motion. Neck supple.  Cardiovascular: Normal rate, regular rhythm and normal heart sounds.     Respiratory: Effort normal and breath sounds normal.  GI: Soft. She exhibits no mass. There is tenderness (right lower pelvis). There is no guarding.  Genitourinary: Cervix exhibits no motion tenderness. Right adnexum displays mass, tenderness and fullness. Left adnexum displays no mass, no tenderness and no fullness. Vaginal discharge (white, creamy) found.       Negative cervical motion tenderness  Neurological: She is alert and oriented to person, place, and time. She has normal reflexes.  Skin: Skin is warm and dry.    MAU Course  Procedures  Results for orders placed during the hospital encounter of 09/18/12 (from the past 24 hour(s))  URINALYSIS, ROUTINE W REFLEX MICROSCOPIC     Status: Abnormal   Collection Time   09/18/12 10:22 PM      Component Value Range   Color, Urine YELLOW  YELLOW   APPearance CLEAR  CLEAR   Specific Gravity, Urine >1.030 (*) 1.005 - 1.030   pH 5.5  5.0 - 8.0   Glucose, UA NEGATIVE  NEGATIVE mg/dL   Hgb urine dipstick NEGATIVE  NEGATIVE   Bilirubin Urine NEGATIVE  NEGATIVE   Ketones, ur NEGATIVE  NEGATIVE mg/dL   Protein, ur NEGATIVE  NEGATIVE mg/dL   Urobilinogen, UA 0.2  0.0 - 1.0 mg/dL   Nitrite NEGATIVE  NEGATIVE   Leukocytes, UA NEGATIVE  NEGATIVE  POCT  PREGNANCY, URINE     Status: Normal   Collection Time   09/18/12 10:28 PM      Component Value Range   Preg Test, Ur NEGATIVE  NEGATIVE  WET PREP, GENITAL     Status: Abnormal   Collection Time   09/18/12 11:10 PM      Component Value Range   Yeast Wet Prep HPF POC NONE SEEN  NONE SEEN   Trich, Wet Prep NONE SEEN  NONE SEEN   Clue Cells Wet Prep HPF POC MODERATE (*) NONE SEEN   WBC, Wet Prep HPF POC FEW (*) NONE SEEN    Ultrasound: IMPRESSION:  Normal study. No evidence of pelvic mass or other significant  abnormality.   Assessment and Plan  Bacterial Vaginosis Pelvic Pain  Plan: Continue Flagyl Ibuprofen for pain.  [facility] 09/18/2012, 11:06 PM

## 2012-09-19 DIAGNOSIS — A499 Bacterial infection, unspecified: Secondary | ICD-10-CM

## 2012-09-19 DIAGNOSIS — N760 Acute vaginitis: Secondary | ICD-10-CM

## 2012-09-19 MED ORDER — METRONIDAZOLE 500 MG PO TABS: 500.0000 mg | ORAL_TABLET | Freq: Two times a day (BID) | ORAL | Status: DC

## 2012-09-19 NOTE — Progress Notes (Signed)
Threasa Heads CNM in earlier and discussed results of u/s and labs. Written and verbal d/c instructions given and understanding voiced.

## 2012-09-19 NOTE — Progress Notes (Signed)
Pt offered pain med earlier but does not want anything. Doesn't like to take meds

## 2012-09-19 NOTE — MAU Provider Note (Signed)
Attestation of Attending Supervision of Advanced Practitioner (CNM/NP): Evaluation and management procedures were performed by the Advanced Practitioner under my supervision and collaboration.  I have reviewed the Advanced Practitioner's note and chart, and I agree with the management and plan.  Tyronza Happe 09/19/2012 1:36 AM

## 2012-09-21 LAB — GC/CHLAMYDIA PROBE AMP: CT Probe RNA: NEGATIVE

## 2013-05-24 ENCOUNTER — Inpatient Hospital Stay (HOSPITAL_COMMUNITY)
Admission: AD | Admit: 2013-05-24 | Discharge: 2013-05-24 | Disposition: A | Discharge status: Home / Self Care | Payer: Self-pay | Source: Ambulatory Visit | Attending: Obstetrics & Gynecology | Admitting: Obstetrics & Gynecology

## 2013-05-24 ENCOUNTER — Encounter (HOSPITAL_COMMUNITY): Payer: Self-pay

## 2013-05-24 DIAGNOSIS — B3731 Acute candidiasis of vulva and vagina: Secondary | ICD-10-CM | POA: Insufficient documentation

## 2013-05-24 DIAGNOSIS — B373 Candidiasis of vulva and vagina: Secondary | ICD-10-CM | POA: Insufficient documentation

## 2013-05-24 DIAGNOSIS — N949 Unspecified condition associated with female genital organs and menstrual cycle: Secondary | ICD-10-CM | POA: Insufficient documentation

## 2013-05-24 LAB — CBC
Hemoglobin: 12.3 g/dL (ref 12.0–15.0)
MCHC: 34.3 g/dL (ref 30.0–36.0)
Platelets: 216 10*3/uL (ref 150–400)
RBC: 4.01 MIL/uL (ref 3.87–5.11)

## 2013-05-24 LAB — POCT PREGNANCY, URINE: Preg Test, Ur: NEGATIVE

## 2013-05-24 LAB — URINALYSIS, ROUTINE W REFLEX MICROSCOPIC
Bilirubin Urine: NEGATIVE
Ketones, ur: NEGATIVE mg/dL
Nitrite: NEGATIVE
Urobilinogen, UA: 0.2 mg/dL (ref 0.0–1.0)

## 2013-05-24 LAB — URINE MICROSCOPIC-ADD ON

## 2013-05-24 MED ORDER — KETOROLAC TROMETHAMINE 60 MG/2ML IM SOLN
60.0000 mg | INTRAMUSCULAR | Status: AC
  Administered 2013-05-24: 60 mg via INTRAMUSCULAR
  Filled 2013-05-24: qty 2

## 2013-05-24 MED ORDER — FLUCONAZOLE 150 MG PO TABS
150.0000 mg | ORAL_TABLET | Freq: Once | ORAL | Status: AC
  Administered 2013-05-24: 150 mg via ORAL
  Filled 2013-05-24: qty 1

## 2013-05-24 MED ORDER — FLUCONAZOLE 150 MG PO TABS: ORAL_TABLET | ORAL | Status: DC

## 2013-05-24 NOTE — MAU Note (Signed)
Patient states she has a history of irregular periods. States she has had pelvic pain for about 2 weeks but getting worse and feels like she is not completely emptying her bladder. Has some nausea, no vomiting. Denies bleeding or discharge.

## 2013-05-24 NOTE — MAU Provider Note (Signed)
Chief Complaint: Pelvic Pain, Nausea and Possible Pregnancy   First Provider Initiated Contact with Patient 05/24/13 1937     SUBJECTIVE HPI: Linda Duffy is a 27 y.o. [id number] who presents to maternity admissions reporting pelvic pain x2 weeks with increased pain when urinating.  She reports this feels like a UTI she had in the past.  She has not taken medication for the pain.  She usually has pain with her menses, which are irregular, but she is not currently menstruating.  Patient's last menstrual period was 03/13/2013.  She denies vaginal bleeding, vaginal itching/burning, urinary symptoms, h/a, dizziness, n/v, or fever/chills.     Past Medical History  Diagnosis Date  . Ovarian cyst 2012  . Abnormal Pap smear   . Anemia    Past Surgical History  Procedure Laterality Date  . Cesarean section    . Dilation and curettage of uterus     History   Social History  . Marital Status: Single    Spouse Name: N/A    Number of Children: N/A  . Years of Education: N/A   Occupational History  . Not on file.   Social History Main Topics  . Smoking status: Current Some Day Smoker -- 0.25 packs/day    Last Attempt to Quit: 05/19/2012  . Smokeless tobacco: Not on file  . Alcohol Use: No  . Drug Use: No  . Sexual Activity: Yes    Birth Control/ Protection: None   Other Topics Concern  . Not on file   Social History Narrative  . No narrative on file   No current facility-administered medications on file prior to encounter.   No current outpatient prescriptions on file prior to encounter.   No Known Allergies  ROS: Pertinent items in HPI  OBJECTIVE Blood pressure 127/75, pulse 110, temperature 99.3 F (37.4 C), temperature source Oral, resp. rate 16, height 5' 6.5" (1.689 m), weight 58.333 kg (128 lb 9.6 oz), last menstrual period 03/13/2013, SpO2 100.00%. GENERAL: Well-developed, well-nourished female in no acute distress.  HEENT: Normocephalic HEART: normal rate RESP: normal  effort ABDOMEN: Soft, non-tender EXTREMITIES: Nontender, no edema NEURO: Alert and oriented SPECULUM EXAM: normal external genitalia. Vagina is pink and ruggated. Copious amount of thick, white discharge coating the walls of the vagina. No bleeding noted. No masses palpable. Mild tenderness to palpation of the right adnexa. Uterus is normal size and contour with mild tenderness to palpation of the fundus.   LAB RESULTS Results for orders placed during the hospital encounter of 05/24/13 (from the past 24 hour(s))  URINALYSIS, ROUTINE W REFLEX MICROSCOPIC     Status: Abnormal   Collection Time    05/24/13  6:55 PM      Result Value Range   Color, Urine YELLOW  YELLOW   APPearance CLEAR  CLEAR   Specific Gravity, Urine 1.015  1.005 - 1.030   pH 7.5  5.0 - 8.0   Glucose, UA NEGATIVE  NEGATIVE mg/dL   Hgb urine dipstick TRACE (*) NEGATIVE   Bilirubin Urine NEGATIVE  NEGATIVE   Ketones, ur NEGATIVE  NEGATIVE mg/dL   Protein, ur NEGATIVE  NEGATIVE mg/dL   Urobilinogen, UA 0.2  0.0 - 1.0 mg/dL   Nitrite NEGATIVE  NEGATIVE   Leukocytes, UA SMALL (*) NEGATIVE  URINE MICROSCOPIC-ADD ON     Status: Abnormal   Collection Time    05/24/13  6:55 PM      Result Value Range   Squamous Epithelial / LPF FEW (*) RARE  WBC, UA 3-6  <3 WBC/hpf   RBC / HPF 0-2  <3 RBC/hpf   Bacteria, UA RARE  RARE   Urine-Other STARCH    POCT PREGNANCY, URINE     Status: None   Collection Time    05/24/13  7:35 PM      Result Value Range   Preg Test, Ur NEGATIVE  NEGATIVE  CBC     Status: Abnormal   Collection Time    05/24/13  7:52 PM      Result Value Range   WBC 6.5  4.0 - 10.5 K/uL   RBC 4.01  3.87 - 5.11 MIL/uL   Hemoglobin 12.3  12.0 - 15.0 g/dL   HCT 16.1 (*) 09.6 - 04.5 %   MCV 89.5  78.0 - 100.0 fL   MCH 30.7  26.0 - 34.0 pg   MCHC 34.3  30.0 - 36.0 g/dL   RDW 40.9  81.1 - 91.4 %   Platelets 216  150 - 400 K/uL    Toradol 60 mg IM x1 dose in MAU  Report to Joseph Berkshire, PA-C   Sharen Counter Certified Nurse-Midwife 05/24/2013  9:01 PM   2100 - Care assumed from Sharen Counter, CNM  MDM Patient given Diflucan in MAU  A: Yeast vulvovaginitis, clinic  P: Discharge home Rx for Diflucan sent to patient's pharmacy to be taken in 3 days Patient may return to MAU as needed or if her condition were to change or worsen  Freddi Starr, PA-C 05/24/2013 10:11 PM

## 2013-05-25 LAB — GC/CHLAMYDIA PROBE AMP: GC Probe RNA: NEGATIVE

## 2013-05-26 LAB — URINE CULTURE

## 2014-06-26 ENCOUNTER — Encounter (HOSPITAL_COMMUNITY): Payer: Self-pay

## 2014-10-18 ENCOUNTER — Encounter (HOSPITAL_COMMUNITY): Payer: Self-pay | Admitting: *Deleted

## 2014-10-18 ENCOUNTER — Inpatient Hospital Stay (HOSPITAL_COMMUNITY)
Admission: AD | Admit: 2014-10-18 | Discharge: 2014-10-18 | Disposition: A | Discharge status: Home / Self Care | Payer: Self-pay | Source: Ambulatory Visit | Attending: Obstetrics and Gynecology | Admitting: Obstetrics and Gynecology

## 2014-10-18 ENCOUNTER — Inpatient Hospital Stay (HOSPITAL_COMMUNITY): Payer: Self-pay

## 2014-10-18 DIAGNOSIS — O99331 Smoking (tobacco) complicating pregnancy, first trimester: Secondary | ICD-10-CM | POA: Insufficient documentation

## 2014-10-18 DIAGNOSIS — Z3A09 9 weeks gestation of pregnancy: Secondary | ICD-10-CM | POA: Insufficient documentation

## 2014-10-18 DIAGNOSIS — O26899 Other specified pregnancy related conditions, unspecified trimester: Secondary | ICD-10-CM

## 2014-10-18 DIAGNOSIS — O219 Vomiting of pregnancy, unspecified: Secondary | ICD-10-CM

## 2014-10-18 DIAGNOSIS — O210 Mild hyperemesis gravidarum: Secondary | ICD-10-CM | POA: Insufficient documentation

## 2014-10-18 DIAGNOSIS — R109 Unspecified abdominal pain: Secondary | ICD-10-CM | POA: Insufficient documentation

## 2014-10-18 DIAGNOSIS — Z3A08 8 weeks gestation of pregnancy: Secondary | ICD-10-CM

## 2014-10-18 LAB — CBC
HEMATOCRIT: 30.5 % — AB (ref 36.0–46.0)
Hemoglobin: 10.7 g/dL — ABNORMAL LOW (ref 12.0–15.0)
MCH: 31.5 pg (ref 26.0–34.0)
MCHC: 35.1 g/dL (ref 30.0–36.0)
MCV: 89.7 fL (ref 78.0–100.0)
Platelets: 218 10*3/uL (ref 150–400)
RBC: 3.4 MIL/uL — AB (ref 3.87–5.11)
RDW: 13.7 % (ref 11.5–15.5)
WBC: 6.9 10*3/uL (ref 4.0–10.5)

## 2014-10-18 LAB — POCT PREGNANCY, URINE: Preg Test, Ur: POSITIVE — AB

## 2014-10-18 LAB — URINALYSIS, ROUTINE W REFLEX MICROSCOPIC
BILIRUBIN URINE: NEGATIVE
Glucose, UA: NEGATIVE mg/dL
HGB URINE DIPSTICK: NEGATIVE
KETONES UR: NEGATIVE mg/dL
Leukocytes, UA: NEGATIVE
Nitrite: NEGATIVE
PH: 6 (ref 5.0–8.0)
Protein, ur: NEGATIVE mg/dL
SPECIFIC GRAVITY, URINE: 1.01 (ref 1.005–1.030)
Urobilinogen, UA: 0.2 mg/dL (ref 0.0–1.0)

## 2014-10-18 LAB — WET PREP, GENITAL
TRICH WET PREP: NONE SEEN
WBC WET PREP: NONE SEEN
Yeast Wet Prep HPF POC: NONE SEEN

## 2014-10-18 LAB — HCG, QUANTITATIVE, PREGNANCY: HCG, BETA CHAIN, QUANT, S: 167469 m[IU]/mL — AB (ref <5)

## 2014-10-18 MED ORDER — PROMETHAZINE HCL 12.5 MG PO TABS: 12.5000 mg | ORAL_TABLET | Freq: Four times a day (QID) | ORAL | Status: DC | PRN

## 2014-10-18 MED ORDER — PROMETHAZINE HCL 25 MG/ML IJ SOLN
25.0000 mg | Freq: Once | INTRAMUSCULAR | Status: AC
  Administered 2014-10-18: 25 mg via INTRAVENOUS
  Filled 2014-10-18: qty 1

## 2014-10-18 MED ORDER — FAMOTIDINE IN NACL 20-0.9 MG/50ML-% IV SOLN
20.0000 mg | Freq: Once | INTRAVENOUS | Status: AC
  Administered 2014-10-18: 20 mg via INTRAVENOUS
  Filled 2014-10-18: qty 50

## 2014-10-18 MED ORDER — PROMETHAZINE HCL 25 MG/ML IJ SOLN: 12.5000 mg | Freq: Once | INTRAMUSCULAR | Status: DC

## 2014-10-18 MED ORDER — DEXTROSE 5 % IN LACTATED RINGERS IV BOLUS
1000.0000 mL | Freq: Once | INTRAVENOUS | Status: AC
Start: 2014-10-18 — End: 2014-10-18
  Administered 2014-10-18: 1000 mL via INTRAVENOUS

## 2014-10-18 NOTE — Discharge Instructions (Signed)
Abdominal Pain During Pregnancy Abdominal pain is common in pregnancy. Most of the time, it does not cause harm. There are many causes of abdominal pain. Some causes are more serious than others. Some of the causes of abdominal pain in pregnancy are easily diagnosed. Occasionally, the diagnosis takes time to understand. Other times, the cause is not determined. Abdominal pain can be a sign that something is very wrong with the pregnancy, or the pain may have nothing to do with the pregnancy at all. For this reason, always tell your health care provider if you have any abdominal discomfort. HOME CARE INSTRUCTIONS  Monitor your abdominal pain for any changes. The following actions may help to alleviate any discomfort you are experiencing:  Do not have sexual intercourse or put anything in your vagina until your symptoms go away completely.  Get plenty of rest until your pain improves.  Drink clear fluids if you feel nauseous. Avoid solid food as long as you are uncomfortable or nauseous.  Only take over-the-counter or prescription medicine as directed by your health care provider.  Keep all follow-up appointments with your health care provider. SEEK IMMEDIATE MEDICAL CARE IF:  You are bleeding, leaking fluid, or passing tissue from the vagina.  You have increasing pain or cramping.  You have persistent vomiting.  You have painful or bloody urination.  You have a fever.  You notice a decrease in your baby's movements.  You have extreme weakness or feel faint.  You have shortness of breath, with or without abdominal pain.  You develop a severe headache with abdominal pain.  You have abnormal vaginal discharge with abdominal pain.  You have persistent diarrhea.  You have abdominal pain that continues even after rest, or gets worse. MAKE SURE YOU:   Understand these instructions.  Will watch your condition.  Will get help right away if you are not doing well or get  worse. Document Released: 08/11/2005 Document Revised: 06/01/2013 Document Reviewed: 03/10/2013 [facility] Patient Information 2015 [place], [place]. This information is not intended to replace advice given to you by your health care provider. Make sure you discuss any questions you have with your health care provider. Hyperemesis Gravidarum Hyperemesis gravidarum is a severe form of nausea and vomiting that happens during pregnancy. Hyperemesis is worse than morning sickness. It may cause you to have nausea or vomiting all day for many days. It may keep you from eating and drinking enough food and liquids. Hyperemesis usually occurs during the first half (the first 20 weeks) of pregnancy. It often goes away once a woman is in her second half of pregnancy. However, sometimes hyperemesis continues through an entire pregnancy.  CAUSES  The cause of this condition is not completely known but is thought to be related to changes in the body's hormones when pregnant. It could be from the high level of the pregnancy hormone or an increase in estrogen in the body.  SIGNS AND SYMPTOMS   Severe nausea and vomiting.  Nausea that does not go away.  Vomiting that does not allow you to keep any food down.  Weight loss and body fluid loss (dehydration).  Having no desire to eat or not liking food you have previously enjoyed. DIAGNOSIS  Your health care provider will do a physical exam and ask you about your symptoms. He or she may also order blood tests and urine tests to make sure something else is not causing the problem.  TREATMENT  You may only need medicine to control the  problem. If medicines do not control the nausea and vomiting, you will be treated in the hospital to prevent dehydration, increased acid in the blood (acidosis), weight loss, and changes in the electrolytes in your body that may harm the unborn baby (fetus). You may need IV fluids.  HOME CARE INSTRUCTIONS   Only take over-the-counter or  prescription medicines as directed by your health care provider.  Try eating a couple of dry crackers or toast in the morning before getting out of bed.  Avoid foods and smells that upset your stomach.  Avoid fatty and spicy foods.  Eat 5-6 small meals a day.  Do not drink when eating meals. Drink between meals.  For snacks, eat high-protein foods, such as cheese.  Eat or suck on things that have ginger in them. Ginger helps nausea.  Avoid food preparation. The smell of food can spoil your appetite.  Avoid iron pills and iron in your multivitamins until after 3-4 months of being pregnant. However, consult with your health care provider before stopping any prescribed iron pills. SEEK MEDICAL CARE IF:   Your abdominal pain increases.  You have a severe headache.  You have vision problems.  You are losing weight. SEEK IMMEDIATE MEDICAL CARE IF:   You are unable to keep fluids down.  You vomit blood.  You have constant nausea and vomiting.  You have excessive weakness.  You have extreme thirst.  You have dizziness or fainting.  You have a fever or persistent symptoms for more than 2-3 days.  You have a fever and your symptoms suddenly get worse. MAKE SURE YOU:   Understand these instructions.  Will watch your condition.  Will get help right away if you are not doing well or get worse. Document Released: 08/11/2005 Document Revised: 06/01/2013 Document Reviewed: 03/23/2013 [facility] Patient Information 2015 [place]. This information is not intended to replace advice given to you by your health care provider. Make sure you discuss any questions you have with your health care provider. Abdominal Pain During Pregnancy Abdominal pain is common in pregnancy. Most of the time, it does not cause harm. There are many causes of abdominal pain. Some causes are more serious than others. Some of the causes of abdominal pain in pregnancy are easily diagnosed. Occasionally,  the diagnosis takes time to understand. Other times, the cause is not determined. Abdominal pain can be a sign that something is very wrong with the pregnancy, or the pain may have nothing to do with the pregnancy at all. For this reason, always tell your health care provider if you have any abdominal discomfort. HOME CARE INSTRUCTIONS  Monitor your abdominal pain for any changes. The following actions may help to alleviate any discomfort you are experiencing:  Do not have sexual intercourse or put anything in your vagina until your symptoms go away completely.  Get plenty of rest until your pain improves.  Drink clear fluids if you feel nauseous. Avoid solid food as long as you are uncomfortable or nauseous.  Only take over-the-counter or prescription medicine as directed by your health care provider.  Keep all follow-up appointments with your health care provider. SEEK IMMEDIATE MEDICAL CARE IF:  You are bleeding, leaking fluid, or passing tissue from the vagina.  You have increasing pain or cramping.  You have persistent vomiting.  You have painful or bloody urination.  You have a fever.  You notice a decrease in your baby's movements.  You have extreme weakness or feel faint.  You  have shortness of breath, with or without abdominal pain.  You develop a severe headache with abdominal pain.  You have abnormal vaginal discharge with abdominal pain.  You have persistent diarrhea.  You have abdominal pain that continues even after rest, or gets worse. MAKE SURE YOU:   Understand these instructions.  Will watch your condition.  Will get help right away if you are not doing well or get worse. Document Released: 08/11/2005 Document Revised: 06/01/2013 Document Reviewed: 03/10/2013 [facility] Patient Information 2015 [place], [place]. This information is not intended to replace advice given to you by your health care provider. Make sure you discuss any questions you have with  your health care provider.

## 2014-10-18 NOTE — MAU Provider Note (Signed)
History     CSN: [id number]  Arrival date and time: 10/18/14 1321   First Provider Initiated Contact with Patient 10/18/14 1647      Chief Complaint  Patient presents with  . Emesis  . Abdominal Pain   HPI   [name] is a 29 y.o. female [phone] [id number] at 29105w2d who presents with N/V; she did not know she was pregnant. Her pain is located on both sides of her lower stomach. She denies vaginal bleeding.   She plans to start care with Dr. Gaynell FaceMarshall.   OB History    Gravida Para Term Preterm AB TAB SAB Ectopic Multiple Living   4 1 1  2 2    1       Past Medical History  Diagnosis Date  . Ovarian cyst 2012  . Abnormal Pap smear   . Anemia     Past Surgical History  Procedure Laterality Date  . Cesarean section    . Dilation and curettage of uterus      Family History  Problem Relation Age of Onset  . Diabetes Mother   . Hypertension Mother     History  Substance Use Topics  . Smoking status: Current Some Day Smoker -- 0.25 packs/day    Last Attempt to Quit: 05/19/2012  . Smokeless tobacco: Not on file  . Alcohol Use: No    Allergies: No Known Allergies  Prescriptions prior to admission  Medication Sig Dispense Refill Last Dose  . fluconazole (DIFLUCAN) 150 MG tablet Take 1 tab (150 mg) in 3 days (Patient not taking: Reported on 10/18/2014) 1 tablet 0 Completed Course at Unknown time   Results for orders placed or performed during the hospital encounter of 10/18/14 (from the past 48 hour(s))  Urinalysis, Routine w reflex microscopic     Status: Abnormal   Collection Time: 10/18/14  1:30 PM  Result Value Ref Range   Color, Urine YELLOW YELLOW   APPearance HAZY (A) CLEAR   Specific Gravity, Urine 1.010 1.005 - 1.030   pH 6.0 5.0 - 8.0   Glucose, UA NEGATIVE NEGATIVE mg/dL   Hgb urine dipstick NEGATIVE NEGATIVE   Bilirubin Urine NEGATIVE NEGATIVE   Ketones, ur NEGATIVE NEGATIVE mg/dL   Protein, ur NEGATIVE NEGATIVE mg/dL   Urobilinogen, UA 0.2 0.0 - 1.0  mg/dL   Nitrite NEGATIVE NEGATIVE   Leukocytes, UA NEGATIVE NEGATIVE    Comment: MICROSCOPIC NOT DONE ON URINES WITH NEGATIVE PROTEIN, BLOOD, LEUKOCYTES, NITRITE, OR GLUCOSE <1000 mg/dL.  Pregnancy, urine POC     Status: Abnormal   Collection Time: 10/18/14  1:46 PM  Result Value Ref Range   Preg Test, Ur POSITIVE (A) NEGATIVE    Comment:        THE SENSITIVITY OF THIS METHODOLOGY IS >24 mIU/mL   CBC     Status: Abnormal   Collection Time: 10/18/14  5:00 PM  Result Value Ref Range   WBC 6.9 4.0 - 10.5 K/uL   RBC 3.40 (L) 3.87 - 5.11 MIL/uL   Hemoglobin 10.7 (L) 12.0 - 15.0 g/dL   HCT 14.730.5 (L) 82.936.0 - 56.246.0 %   MCV 89.7 78.0 - 100.0 fL   MCH 31.5 26.0 - 34.0 pg   MCHC 35.1 30.0 - 36.0 g/dL   RDW 13.013.7 86.511.5 - 78.415.5 %   Platelets 218 150 - 400 K/uL  hCG, quantitative, pregnancy     Status: Abnormal   Collection Time: 10/18/14  5:00 PM  Result Value Ref Range  hCG, Beta Chain, Quant, S 409811 (H) <5 mIU/mL    Comment:          GEST. AGE      CONC.  (mIU/mL)   <=1 WEEK        5 - 50     2 WEEKS       50 - 500     3 WEEKS       100 - 10,000     4 WEEKS     1,000 - 30,000     5 WEEKS     3,500 - 115,000   6-8 WEEKS     12,000 - 270,000    12 WEEKS     15,000 - 220,000        FEMALE AND NON-PREGNANT FEMALE:     LESS THAN 5 mIU/mL   Wet prep, genital     Status: Abnormal   Collection Time: 10/18/14  6:20 PM  Result Value Ref Range   Yeast Wet Prep HPF POC NONE SEEN NONE SEEN   Trich, Wet Prep NONE SEEN NONE SEEN   Clue Cells Wet Prep HPF POC FEW (A) NONE SEEN   WBC, Wet Prep HPF POC NONE SEEN NONE SEEN    Comment: MODERATE BACTERIA SEEN    Review of Systems  Constitutional: Positive for chills. Negative for fever.  Gastrointestinal: Positive for nausea, vomiting and abdominal pain. Negative for heartburn, diarrhea and constipation.  Genitourinary: Negative for dysuria, urgency, frequency and hematuria.   Physical Exam   Blood pressure 136/67, temperature 98.4 F (36.9 C),  temperature source Oral, resp. rate 20, height  (1.702 m), weight 62.823 kg (138 lb 8 oz), last menstrual period 08/14/2014, SpO2 100 %.  Physical Exam  Constitutional: She is oriented to person, place, and time. She appears well-developed and well-nourished. No distress.  HENT:  Head: Normocephalic.  Eyes: Pupils are equal, round, and reactive to light.  Neck: Neck supple.  Respiratory: Effort normal.  GI: Soft. She exhibits no distension. There is no tenderness. There is no rebound.  Genitourinary:  GC and wet prep collected by RN   Neurological: She is alert and oriented to person, place, and time.  Skin: Skin is warm. She is not diaphoretic.  Psychiatric: Her behavior is normal.    MAU Course  Procedures  None  MDM UA  D5LR with 25 mg of phenergan IV Patient tolerated PO fluids and crackers and feels much better following fluid    Assessment and Plan   A:  1. Nausea and vomiting during pregnancy   2. Abdominal pain in pregnancy    P:  Discharge home in stable condition RX: Phenergan Start prenatal care ASAP Small, frequent meals Pregnancy verification letter given    Iona Hansen Rasch, NP 10/18/2014 7:14 PM

## 2014-10-18 NOTE — MAU Note (Signed)
Vomiting for the last 2 days, having abdominal cramps.  Had diarrhea yesterday, not today.  Feeling like she is going to pass out.  Denies fever.

## 2014-10-19 LAB — GC/CHLAMYDIA PROBE AMP (~~LOC~~) NOT AT ARMC
CHLAMYDIA, DNA PROBE: NEGATIVE
NEISSERIA GONORRHEA: NEGATIVE

## 2015-03-22 ENCOUNTER — Inpatient Hospital Stay (HOSPITAL_COMMUNITY): Payer: Medicaid Other

## 2015-03-22 ENCOUNTER — Observation Stay (HOSPITAL_COMMUNITY)
Admission: AD | Admit: 2015-03-22 | Discharge: 2015-03-22 | Disposition: A | Discharge status: Home / Self Care | Payer: Medicaid Other | Source: Ambulatory Visit | Attending: Family Medicine | Admitting: Family Medicine

## 2015-03-22 ENCOUNTER — Encounter (HOSPITAL_COMMUNITY): Payer: Self-pay | Admitting: *Deleted

## 2015-03-22 DIAGNOSIS — Z87891 Personal history of nicotine dependence: Secondary | ICD-10-CM | POA: Diagnosis not present

## 2015-03-22 DIAGNOSIS — S90871A Other superficial bite of right foot, initial encounter: Secondary | ICD-10-CM | POA: Diagnosis not present

## 2015-03-22 DIAGNOSIS — O9A213 Injury, poisoning and certain other consequences of external causes complicating pregnancy, third trimester: Principal | ICD-10-CM | POA: Insufficient documentation

## 2015-03-22 DIAGNOSIS — Y929 Unspecified place or not applicable: Secondary | ICD-10-CM | POA: Insufficient documentation

## 2015-03-22 DIAGNOSIS — O0933 Supervision of pregnancy with insufficient antenatal care, third trimester: Secondary | ICD-10-CM

## 2015-03-22 DIAGNOSIS — W540XXA Bitten by dog, initial encounter: Secondary | ICD-10-CM | POA: Diagnosis not present

## 2015-03-22 DIAGNOSIS — Z3A30 30 weeks gestation of pregnancy: Secondary | ICD-10-CM | POA: Insufficient documentation

## 2015-03-22 DIAGNOSIS — O2693 Pregnancy related conditions, unspecified, third trimester: Secondary | ICD-10-CM | POA: Diagnosis present

## 2015-03-22 DIAGNOSIS — IMO0002 Reserved for concepts with insufficient information to code with codable children: Secondary | ICD-10-CM | POA: Insufficient documentation

## 2015-03-22 DIAGNOSIS — W19XXXA Unspecified fall, initial encounter: Secondary | ICD-10-CM | POA: Diagnosis present

## 2015-03-22 DIAGNOSIS — Z363 Encounter for antenatal screening for malformations: Secondary | ICD-10-CM

## 2015-03-22 HISTORY — DX: Unspecified abnormal cytological findings in specimens from vagina: R87.629

## 2015-03-22 LAB — DIFFERENTIAL
Basophils Absolute: 0 10*3/uL (ref 0.0–0.1)
Basophils Relative: 0 % (ref 0–1)
EOS ABS: 0 10*3/uL (ref 0.0–0.7)
Eosinophils Relative: 0 % (ref 0–5)
LYMPHS ABS: 2.6 10*3/uL (ref 0.7–4.0)
LYMPHS PCT: 22 % (ref 12–46)
MONOS PCT: 6 % (ref 3–12)
Monocytes Absolute: 0.7 10*3/uL (ref 0.1–1.0)
NEUTROS ABS: 8.2 10*3/uL — AB (ref 1.7–7.7)
NEUTROS PCT: 72 % (ref 43–77)

## 2015-03-22 LAB — TYPE AND SCREEN
ABO/RH(D): O POS
Antibody Screen: NEGATIVE

## 2015-03-22 LAB — CBC
HEMATOCRIT: 27.7 % — AB (ref 36.0–46.0)
HEMOGLOBIN: 9.4 g/dL — AB (ref 12.0–15.0)
MCH: 31.6 pg (ref 26.0–34.0)
MCHC: 33.9 g/dL (ref 30.0–36.0)
MCV: 93.3 fL (ref 78.0–100.0)
Platelets: 153 10*3/uL (ref 150–400)
RBC: 2.97 MIL/uL — ABNORMAL LOW (ref 3.87–5.11)
RDW: 13.7 % (ref 11.5–15.5)
WBC: 11.5 10*3/uL — ABNORMAL HIGH (ref 4.0–10.5)

## 2015-03-22 LAB — HEPATITIS B SURFACE ANTIGEN: Hepatitis B Surface Ag: NEGATIVE

## 2015-03-22 MED ORDER — ZOLPIDEM TARTRATE 5 MG PO TABS: 5.0000 mg | ORAL_TABLET | Freq: Every evening | ORAL | Status: DC | PRN

## 2015-03-22 MED ORDER — AMOXICILLIN-POT CLAVULANATE 875-125 MG PO TABS
1.0000 | ORAL_TABLET | Freq: Two times a day (BID) | ORAL | Status: DC
  Administered 2015-03-22: 1 via ORAL
  Filled 2015-03-22 (×3): qty 1

## 2015-03-22 MED ORDER — AMOXICILLIN-POT CLAVULANATE 875-125 MG PO TABS: 1.0000 | ORAL_TABLET | Freq: Two times a day (BID) | ORAL | Status: DC

## 2015-03-22 MED ORDER — AMOXICILLIN-POT CLAVULANATE 875-125 MG PO TABS
1.0000 | ORAL_TABLET | Freq: Two times a day (BID) | ORAL | Status: DC
  Filled 2015-03-22 (×2): qty 1

## 2015-03-22 MED ORDER — DOCUSATE SODIUM 100 MG PO CAPS
100.0000 mg | ORAL_CAPSULE | Freq: Every day | ORAL | Status: DC
  Filled 2015-03-22: qty 1

## 2015-03-22 MED ORDER — RABIES VACCINE, PCEC IM SUSR
1.0000 mL | Freq: Once | INTRAMUSCULAR | Status: DC
Start: 2015-03-22 — End: 2015-03-22

## 2015-03-22 MED ORDER — PRENATAL MULTIVITAMIN CH
1.0000 | ORAL_TABLET | Freq: Every day | ORAL | Status: DC
  Filled 2015-03-22: qty 1

## 2015-03-22 MED ORDER — CALCIUM CARBONATE ANTACID 500 MG PO CHEW
2.0000 | CHEWABLE_TABLET | ORAL | Status: DC | PRN
  Filled 2015-03-22: qty 2

## 2015-03-22 MED ORDER — TETANUS-DIPHTH-ACELL PERTUSSIS 5-2.5-18.5 LF-MCG/0.5 IM SUSP
0.5000 mL | Freq: Once | INTRAMUSCULAR | Status: AC
  Administered 2015-03-22: 0.5 mL via INTRAMUSCULAR
  Filled 2015-03-22: qty 0.5

## 2015-03-22 MED ORDER — ACETAMINOPHEN 325 MG PO TABS
650.0000 mg | ORAL_TABLET | ORAL | Status: DC | PRN
Start: 2015-03-22 — End: 2015-03-23

## 2015-03-22 NOTE — MAU Note (Signed)
Was attacked by a dog in complex.  Bit on the left foot, was bleeding.  Fell on the concrete, abrasions on left knee.  Has not felt movement since, doesn't think she hit her abd.

## 2015-03-22 NOTE — Discharge Instructions (Signed)

## 2015-03-22 NOTE — Plan of Care (Signed)
Pt. Urine in lab 

## 2015-03-22 NOTE — Progress Notes (Signed)
Pt given the GlucoCrush to drink for her 1hr GTT. Lab notified to com draw labs in an hour

## 2015-03-22 NOTE — Discharge Summary (Signed)
Obstetric Discharge Summary Reason for Admission: observation/evaluation Prenatal Procedures: NST, ultrasound and 1 hour gtt Intrapartum Procedures: n/a Postpartum Procedures: n/a Complications-Operative and Postpartum: none HEMOGLOBIN  Date Value Ref Range Status  03/22/2015 9.4* 12.0 - 15.0 g/dL Final   HCT  Date Value Ref Range Status  03/22/2015 27.7* 36.0 - 46.0 % Final    Physical Exam:  General: alert, cooperative and no distress Chest:  Normal respiratory effort Abdomen:  Soft, nontender.  FHR 140's, avg variability, no decels. Uterine irritability at times, rare contractions Cervix:  LTC, no change Extremities: dog bite is slightly erythemous, no discharge, bleeding, or swelling. Non tender  Discharge Diagnoses: [redacted] weeks pregnant, s/p fall and dog bite.   Discharge Information: Date: 03/22/2015 Activity: unrestricted Diet: routine Medications: augmentin  BID X 7 Condition: stable Instructions: refer to practice specific booklet Discharge to: home   Hospital staff will contact animal control tomorrow [phone]) and place them in touch with pt.  If dog is unable to be located, pt to f/u at [facility] for rabies vaccine/PEP.  Follow-up Information    Follow up with [facility].   Specialty:  Obstetrics and Gynecology   Why:  expect a call to set up your first appointment for prenatal care. If you don't hear anything by Monday, call them   Contact information:   [address] [place] [address] [phone]     Follow up at [facility] if you have vaginal bleeding/increased pain.  Marland Kitchen  CRESENZO-DISHMAN,[place] 03/22/2015, 11:31 PM

## 2015-03-22 NOTE — H&P (Signed)
Expand All Collapse All   History     CSN: [id number]  Arrival date & time 03/22/15 1524  None    Chief Complaint  Patient presents with  . Fall and dog bite to ventral side of right foot.    HPI: Linda Duffy is a 29 year old [id number] that is [redacted]w[redacted]d with no history of prenatal care due lack of insurance that presents today due to a dog bite that she received from a small pitbull. This occurred at 3:00pm today and [place]   Denies any pain currently at the site or bleeding. She cleaned the site with soap, water and hydrogen peroxide. Denies any difficulty with ambulation or noticeable swelling. The only medication she reports taking is prenatal vitamins.  FHR is 150 bpm baseline. BPP 8/8 (had a ? Variable decel) Contractions are irregular and mild, q 2-6 minutes   Past Medical History  Diagnosis Date  . Ovarian cyst 2012  . Abnormal Pap smear   . Anemia   . Vaginal Pap smear, abnormal     Past Surgical History  Procedure Laterality Date  . Cesarean section    . Dilation and curettage of uterus      Family History  Problem Relation Age of Onset  . Diabetes Mother   . Hypertension Mother     History  Substance Use Topics  . Smoking status: Former Smoker -- 0.25 packs/day    Quit date: 09/18/2014  . Smokeless tobacco: Not on file  . Alcohol Use: No    OB History    Gravida Para Term Preterm AB TAB SAB Ectopic Multiple Living   Review of Systems  Review of Systems - General ROS: negative Psychological ROS: negative Hematological and Lymphatic ROS: negative Respiratory ROS: no cough, shortness of breath, or wheezing Cardiovascular ROS: no chest pain or dyspnea on exertion Gastrointestinal ROS: no abdominal pain, feels ctx as "tightening" change in bowel habits, or black or  bloody stools  Allergies  Review of patient's allergies indicates no known allergies.  Home Medications  No current outpatient prescriptions on file.  BP 116/59 mmHg  Pulse 94  Temp(Src) 98.7 F (37.1 C) (Oral)  Resp 20  LMP 08/14/2014 (Approximate)  Physical Exam  Constitutional: She is oriented to person, place, and time. She appears well-developed and well-nourished. No distress.  HENT:  Head: Normocephalic and atraumatic.  Eyes: EOM are normal.  Cardiovascular: Normal rate, regular rhythm and normal heart sounds. Exam reveals no gallop and no friction rub.  No murmur heard. Pulmonary/Chest: Effort normal and breath sounds normal. No respiratory distress.  Abdominal: Soft.  CX:  LTC, PP high Musculoskeletal: She exhibits no edema.  Normal range of motion at the R. Ankle and toes. No tenderness to palpation at the metatarsal joints on the R. foot  Lymphadenopathy:   She has no cervical adenopathy.  Neurological: She is alert and oriented to person, place, and time.  Skin: Skin is warm and dry.  There is some erythema on the ventral side of the right foot at the site of the bite, but no discharge,bleeding or swelling noted. Tenderness to palpation px at the site of the bite but not around it. There are  also some random abrasions present her left knee and scratches on both arms.       Size of bite is less than 1cm. Roughly 0.4cm  MAU Course  Procedures (including critical care time)  Labs Reviewed - No data to display  Imaging Results (Last 48 hours)    No results found.        MDM  Assessment: Linda Duffy is a 29 year old [id number] that is [redacted]w[redacted]d with no prenatal care and a reported dog bite on the ventral portion of right foot on the metatarsal region. I do have some concerns of possible rabies exposure or tetanus. Pt and her family now say that they "can probably" locate the dog's owner because [place].    Contracting  mildly s/p fall:  Concern for placental abruption  Plan: Animal control will be contacted tomorrow and will attempt to locate the dog.  If the dog is not able to be found, will start PEP.  Per recommendation of Megan Daugkarty (sp?) at [facility], will start antibiotics as well.  Admit for observation dt frequent mild contractions.  If cervical change is noted, will treat for PTL 1 hour Gtt tonight because pt has not had [facility]

## 2015-03-22 NOTE — Progress Notes (Addendum)
History     CSN: [id number]  Arrival date & time 03/22/15  1524   None     Chief Complaint  Patient presents with  . Fall and dog bite to ventral side of right foot.    HPI: Linda Duffy is a 29 year old G61P1021 that is [redacted]w[redacted]d with no history of prenatal care due lack of insurance that presents today due to a dog bite that she received from a small pitbull. This occurred at 3:00pm today and [place]. They don't know if the dog was wild or belonged to someone. She says that she noticed decrease fetal movement after the incident and it was the only recent she came in. Denies any pain currently at the site or bleeding. She cleaned the site with soap, water and hydrogen peroxide. Denies any difficulty with ambulation or noticeable swelling. The only medication she reports taking is prenatal vitamins.  FHR is 150 bpm baseline.   Past Medical History  Diagnosis Date  . Ovarian cyst 2012  . Abnormal Pap smear   . Anemia   . Vaginal Pap smear, abnormal     Past Surgical History  Procedure Laterality Date  . Cesarean section    . Dilation and curettage of uterus      Family History  Problem Relation Age of Onset  . Diabetes Mother   . Hypertension Mother     History  Substance Use Topics  . Smoking status: Former Smoker -- 0.25 packs/day    Quit date: 09/18/2014  . Smokeless tobacco: Not on file  . Alcohol Use: No    OB History    Gravida Para Term Preterm AB TAB SAB Ectopic Multiple Living   4 1 1  2 2    1       Review of Systems  Review of Systems - General ROS: negative Psychological ROS: negative Hematological and Lymphatic ROS: negative Respiratory ROS: no cough, shortness of breath, or wheezing Cardiovascular ROS: no chest pain or dyspnea on exertion Gastrointestinal ROS: no abdominal pain, change in bowel habits, or black or bloody stools  Allergies  Review of patient's allergies indicates no known allergies.  Home Medications  No  current outpatient prescriptions on file.  BP 116/59 mmHg  Pulse 94  Temp(Src) 98.7 F (37.1 C) (Oral)  Resp 20  LMP 08/14/2014 (Approximate)  Physical Exam  Constitutional: She is oriented to person, place, and time. She appears well-developed and well-nourished. No distress.  HENT:  Head: Normocephalic and atraumatic.  Eyes: EOM are normal.  Cardiovascular: Normal rate, regular rhythm and normal heart sounds.  Exam reveals no gallop and no friction rub.   No murmur heard. Pulmonary/Chest: Effort normal and breath sounds normal. No respiratory distress.  Abdominal: Soft.  Musculoskeletal: She exhibits no edema.  Normal range of motion at the R. Ankle and toes. No tenderness to palpation at the metatarsal joints on the R. foot  Lymphadenopathy:    She has no cervical adenopathy.  Neurological: She is alert and oriented to person, place, and time.  Skin: Skin is warm and dry.  There is some erythema on the ventral side of the right foot at the site of the bite, but no discharge,bleeding or swelling noted. Tenderness to palpation px at the site of the bite but not around it. There are also some random abrasions present her left knee and scratches on both arms.       Size of bite is less than  1cm. Roughly 0.4cm  MAU Course  Procedures (including critical care time)  Labs Reviewed - No data to display No results found.   No diagnosis found.    MDM  Assessment: Linda Duffy is a 29 year old G39P1021 that is [redacted]w[redacted]d with no prenatal care and a reported dog bite on the ventral portion of right foot on the metatarsal region. I do have some concerns of possible rabies exposure or tetanus. She was not aware if the dog had a owner or is a stray dog and there is no signs of a bite on the dorsal side of the foot, only on the ventral(sole side) of the foot. Symptoms of a bite like fever, erythema, and lymphadenopathy usually present in 12-24 hrs after the bite so it is difficult to assess the  severity of this bite. However, given what I have seen on physical exam it does not look like the bite went beyond the epidermal layer and is not consistent with a puncture wound. Inoculation of the bite site with bacteria like Pasturella or aerobic G+ cocci/anaerobes is possible with a dog bite and she may need to be treated prophylactially against these organisms.  Plan: At this time, I would recommend a tetanus shot and postexposure prophylaxis for Rabies for her at this point,  If indicated she may need antibiotic prophylaxis against common bacteria present in a dog bite as well as a CBC,Gram stain, aerobic and anaerobic cultures, but I don't think any of that is necessary at this time. -If she experiences fever, erythema at the site, cellulitis and lymphadenopathy in the next 12-24 hrs after the bite, I'd recommend starting antibiotics with appropriate drug administration taking her pregnancy into consideration. -Will contact Animal control for them to assess the situation and possibly obtain dog. I attempted to reach them but they closed at 5pm [address] [place], [place] [address] [phone]   Addendum:  The patient's family feel "sure" they can locate the owner of the dog because he lives within the complex.  DIscussed with Dr.Megan Daugharty (sp?) at [facility], and she agrees that is is OK to hold the PEP to see if the dog can be found. If he can not be found tomorrow, will start PEP as described above.     See my H&P for updated plan

## 2015-03-23 DIAGNOSIS — Z3A30 30 weeks gestation of pregnancy: Secondary | ICD-10-CM | POA: Insufficient documentation

## 2015-03-23 DIAGNOSIS — IMO0002 Reserved for concepts with insufficient information to code with codable children: Secondary | ICD-10-CM | POA: Insufficient documentation

## 2015-03-23 LAB — RPR: RPR Ser Ql: NONREACTIVE

## 2015-03-23 LAB — HIV ANTIBODY (ROUTINE TESTING W REFLEX): HIV Screen 4th Generation wRfx: NONREACTIVE

## 2015-03-23 LAB — RUBELLA SCREEN: Rubella: 1.9 index (ref >0.99)

## 2015-03-23 LAB — GLUCOSE TOLERANCE, 1 HOUR: Glucose, 1 Hour GTT: 106 mg/dL (ref 70–140)

## 2015-03-30 ENCOUNTER — Encounter: Payer: Medicaid Other | Admitting: Advanced Practice Midwife

## 2015-03-30 ENCOUNTER — Encounter: Payer: Self-pay | Admitting: Obstetrics & Gynecology

## 2015-04-04 ENCOUNTER — Ambulatory Visit (HOSPITAL_COMMUNITY)
Admission: RE | Admit: 2015-04-04 | Discharge: 2015-04-04 | Disposition: A | Discharge status: Home / Self Care | Payer: Medicaid Other | Source: Ambulatory Visit | Attending: Advanced Practice Midwife | Admitting: Advanced Practice Midwife

## 2015-04-04 ENCOUNTER — Other Ambulatory Visit (HOSPITAL_COMMUNITY): Payer: Self-pay | Admitting: Advanced Practice Midwife

## 2015-04-04 ENCOUNTER — Inpatient Hospital Stay (HOSPITAL_COMMUNITY)
Admission: AD | Admit: 2015-04-04 | Discharge: 2015-04-04 | Disposition: A | Discharge status: Home / Self Care | Payer: Medicaid Other | Source: Ambulatory Visit | Attending: Obstetrics & Gynecology | Admitting: Obstetrics & Gynecology

## 2015-04-04 DIAGNOSIS — Z3A31 31 weeks gestation of pregnancy: Secondary | ICD-10-CM | POA: Diagnosis not present

## 2015-04-04 DIAGNOSIS — O0930 Supervision of pregnancy with insufficient antenatal care, unspecified trimester: Secondary | ICD-10-CM | POA: Insufficient documentation

## 2015-04-04 DIAGNOSIS — Z36 Encounter for antenatal screening of mother: Secondary | ICD-10-CM | POA: Diagnosis not present

## 2015-04-04 DIAGNOSIS — Z3689 Encounter for other specified antenatal screening: Secondary | ICD-10-CM

## 2015-04-04 DIAGNOSIS — O3421 Maternal care for scar from previous cesarean delivery: Secondary | ICD-10-CM | POA: Insufficient documentation

## 2015-04-04 DIAGNOSIS — O34219 Maternal care for unspecified type scar from previous cesarean delivery: Secondary | ICD-10-CM

## 2015-04-23 LAB — OB RESULTS CONSOLE GC/CHLAMYDIA
CHLAMYDIA, DNA PROBE: NEGATIVE
Gonorrhea: NEGATIVE

## 2015-04-23 LAB — PROCEDURE REPORT - SCANNED: Pap: NEGATIVE

## 2015-05-14 ENCOUNTER — Other Ambulatory Visit: Payer: Self-pay | Admitting: Obstetrics

## 2015-05-18 NOTE — H&P (Signed)
NAMENAI, BORROMEO                 ACCOUNT NO.:  [id number]  MEDICAL RECORD NO.:  [id number]  LOCATION:                                FACILITY:  [facility]  PHYSICIAN:  Kathreen Cosier, M.D.DATE OF BIRTH:  1986-06-03  DATE OF ADMISSION:  05/24/2015 DATE OF DISCHARGE:                             HISTORY & PHYSICAL   HISTORY OF PRESENT ILLNESS:  This is a 29 year old, gravida 3, para 1-0- 1-1, EDC October 6, and she started prenatal care late, has had a previous C-section, and wants a repeat C section.  Does not want to try a labor.  PAST MEDICAL HISTORY:  Negative.  PAST SURGICAL HISTORY:  C-section x1.  SOCIAL HISTORY:  Negative.  FAMILY HISTORY:  Negative.  SYSTEM REVIEW:  Noncontributory.  PHYSICAL EXAMINATION:  GENERAL:  Well-developed female in no distress. HEENT:  Negative. BREASTS:  Engorged. LUNGS:  Clear to P and A. HEART:  Regular rhythm.  No murmurs, no gallops. ABDOMEN:  Term. PELVIC:  Deferred. EXTREMITIES:  Negative.          ______________________________ Kathreen Cosier, M.D.     BAM/MEDQ  D:  05/17/2015  T:  05/17/2015  Job:  [id number]

## 2015-05-23 ENCOUNTER — Encounter (HOSPITAL_COMMUNITY)
Admission: RE | Admit: 2015-05-23 | Discharge: 2015-05-23 | Disposition: A | Discharge status: Home / Self Care | Payer: Medicaid Other | Source: Ambulatory Visit | Attending: Obstetrics | Admitting: Obstetrics

## 2015-05-23 ENCOUNTER — Encounter (HOSPITAL_COMMUNITY): Payer: Self-pay

## 2015-05-23 LAB — CBC
HCT: 28.4 % — ABNORMAL LOW (ref 36.0–46.0)
Hemoglobin: 9.6 g/dL — ABNORMAL LOW (ref 12.0–15.0)
MCH: 31.7 pg (ref 26.0–34.0)
MCHC: 33.8 g/dL (ref 30.0–36.0)
MCV: 93.7 fL (ref 78.0–100.0)
Platelets: 163 10*3/uL (ref 150–400)
RBC: 3.03 MIL/uL — AB (ref 3.87–5.11)
RDW: 14.5 % (ref 11.5–15.5)
WBC: 11.6 10*3/uL — ABNORMAL HIGH (ref 4.0–10.5)

## 2015-05-23 NOTE — Patient Instructions (Addendum)
Your procedure is scheduled on:05/24/15  Enter through the Main Entrance at :9:30 am Pick up desk phone and dial [phone] and inform us of your arrival.  Please call [phone] if you have any problems the morning of surgery.  Remember: Do not eat food after midnight:tonight Water ok until:7am Thursday   You may brush your teeth the morning of surgery.   DO NOT wear jewelry, eye make-up, lipstick,body lotion, or dark fingernail polish.  (Polished toes are ok) You may wear deodorant.  If you are to be admitted after surgery, leave suitcase in car until your room has been assigned. Patients discharged on the day of surgery will not be allowed to drive home. Wear loose fitting, comfortable clothes for your ride home.

## 2015-05-24 ENCOUNTER — Inpatient Hospital Stay (HOSPITAL_COMMUNITY): Payer: Medicaid Other | Admitting: Anesthesiology

## 2015-05-24 ENCOUNTER — Inpatient Hospital Stay (HOSPITAL_COMMUNITY)
Admission: RE | Admit: 2015-05-24 | Discharge: 2015-05-27 | DRG: 766 | Disposition: A | Discharge status: Home / Self Care | Payer: Medicaid Other | Source: Ambulatory Visit | Attending: Obstetrics | Admitting: Obstetrics

## 2015-05-24 ENCOUNTER — Encounter (HOSPITAL_COMMUNITY): Payer: Self-pay | Admitting: Emergency Medicine

## 2015-05-24 ENCOUNTER — Encounter (HOSPITAL_COMMUNITY)
Admission: RE | Disposition: A | Discharge status: Home / Self Care | Payer: Self-pay | Source: Ambulatory Visit | Attending: Obstetrics

## 2015-05-24 DIAGNOSIS — Z87891 Personal history of nicotine dependence: Secondary | ICD-10-CM

## 2015-05-24 DIAGNOSIS — O34211 Maternal care for low transverse scar from previous cesarean delivery: Principal | ICD-10-CM | POA: Diagnosis present

## 2015-05-24 DIAGNOSIS — Z3A39 39 weeks gestation of pregnancy: Secondary | ICD-10-CM

## 2015-05-24 DIAGNOSIS — Z98891 History of uterine scar from previous surgery: Secondary | ICD-10-CM

## 2015-05-24 LAB — PREPARE RBC (CROSSMATCH)

## 2015-05-24 LAB — RPR: RPR Ser Ql: NONREACTIVE

## 2015-05-24 SURGERY — Surgical Case
Anesthesia: Spinal | Site: Abdomen

## 2015-05-24 MED ORDER — 0.9 % SODIUM CHLORIDE (POUR BTL) OPTIME
TOPICAL | Status: DC | PRN
  Administered 2015-05-24: 1000 mL

## 2015-05-24 MED ORDER — MEPERIDINE HCL 25 MG/ML IJ SOLN
6.2500 mg | INTRAMUSCULAR | Status: DC | PRN
  Administered 2015-05-24: 6.25 mg via INTRAVENOUS

## 2015-05-24 MED ORDER — FENTANYL CITRATE (PF) 100 MCG/2ML IJ SOLN: 25.0000 ug | INTRAMUSCULAR | Status: DC | PRN

## 2015-05-24 MED ORDER — WITCH HAZEL-GLYCERIN EX PADS: 1.0000 "application " | MEDICATED_PAD | CUTANEOUS | Status: DC | PRN

## 2015-05-24 MED ORDER — FENTANYL CITRATE (PF) 100 MCG/2ML IJ SOLN
INTRAMUSCULAR | Status: AC
  Filled 2015-05-24: qty 4

## 2015-05-24 MED ORDER — NALBUPHINE HCL 10 MG/ML IJ SOLN
5.0000 mg | INTRAMUSCULAR | Status: DC | PRN
  Filled 2015-05-24: qty 0.5

## 2015-05-24 MED ORDER — OXYCODONE-ACETAMINOPHEN 5-325 MG PO TABS
2.0000 | ORAL_TABLET | ORAL | Status: DC | PRN
  Administered 2015-05-25 – 2015-05-27 (×8): 2 via ORAL
  Filled 2015-05-24 (×8): qty 2

## 2015-05-24 MED ORDER — MENTHOL 3 MG MT LOZG: 1.0000 | LOZENGE | OROMUCOSAL | Status: DC | PRN

## 2015-05-24 MED ORDER — MORPHINE SULFATE (PF) 0.5 MG/ML IJ SOLN
INTRAMUSCULAR | Status: AC
Start: 2015-05-24 — End: 2015-05-24
  Filled 2015-05-24: qty 100

## 2015-05-24 MED ORDER — LACTATED RINGERS IV SOLN
INTRAVENOUS | Status: DC
  Administered 2015-05-24 (×4): via INTRAVENOUS

## 2015-05-24 MED ORDER — SCOPOLAMINE 1 MG/3DAYS TD PT72
1.0000 | MEDICATED_PATCH | Freq: Once | TRANSDERMAL | Status: DC
Start: 2015-05-24 — End: 2015-05-27
  Filled 2015-05-24: qty 1

## 2015-05-24 MED ORDER — CEFAZOLIN SODIUM-DEXTROSE 2-3 GM-% IV SOLR
INTRAVENOUS | Status: AC
  Filled 2015-05-24: qty 50

## 2015-05-24 MED ORDER — ACETAMINOPHEN 325 MG PO TABS: 650.0000 mg | ORAL_TABLET | ORAL | Status: DC | PRN

## 2015-05-24 MED ORDER — NALBUPHINE HCL 10 MG/ML IJ SOLN
5.0000 mg | Freq: Once | INTRAMUSCULAR | Status: DC | PRN
  Filled 2015-05-24: qty 0.5

## 2015-05-24 MED ORDER — BUPIVACAINE IN DEXTROSE 0.75-8.25 % IT SOLN
INTRATHECAL | Status: DC | PRN
  Administered 2015-05-24: 1.6 mL via INTRATHECAL

## 2015-05-24 MED ORDER — DEXAMETHASONE SODIUM PHOSPHATE 4 MG/ML IJ SOLN
INTRAMUSCULAR | Status: AC
  Filled 2015-05-24: qty 1

## 2015-05-24 MED ORDER — KETOROLAC TROMETHAMINE 30 MG/ML IJ SOLN: 30.0000 mg | Freq: Four times a day (QID) | INTRAMUSCULAR | Status: DC | PRN

## 2015-05-24 MED ORDER — SCOPOLAMINE 1 MG/3DAYS TD PT72
1.0000 | MEDICATED_PATCH | Freq: Once | TRANSDERMAL | Status: DC
  Administered 2015-05-24: 1.5 mg via TRANSDERMAL

## 2015-05-24 MED ORDER — DIPHENHYDRAMINE HCL 25 MG PO CAPS: 25.0000 mg | ORAL_CAPSULE | ORAL | Status: DC | PRN

## 2015-05-24 MED ORDER — OXYTOCIN 40 UNITS IN LACTATED RINGERS INFUSION - SIMPLE MED: 62.5000 mL/h | INTRAVENOUS | Status: AC

## 2015-05-24 MED ORDER — MEPERIDINE HCL 25 MG/ML IJ SOLN
INTRAMUSCULAR | Status: AC
  Filled 2015-05-24: qty 1

## 2015-05-24 MED ORDER — SENNOSIDES-DOCUSATE SODIUM 8.6-50 MG PO TABS
2.0000 | ORAL_TABLET | ORAL | Status: DC
  Administered 2015-05-24 – 2015-05-27 (×3): 2 via ORAL
  Filled 2015-05-24 (×3): qty 2

## 2015-05-24 MED ORDER — EPHEDRINE 5 MG/ML INJ
INTRAVENOUS | Status: DC | PRN
Start: 2015-05-24 — End: 2015-05-24
  Administered 2015-05-24 (×2): 10 mg via INTRAVENOUS

## 2015-05-24 MED ORDER — LACTATED RINGERS IV SOLN
INTRAVENOUS | Status: DC | PRN
  Administered 2015-05-24: 12:00:00 via INTRAVENOUS

## 2015-05-24 MED ORDER — PHENYLEPHRINE 40 MCG/ML (10ML) SYRINGE FOR IV PUSH (FOR BLOOD PRESSURE SUPPORT)
PREFILLED_SYRINGE | INTRAVENOUS | Status: AC
  Filled 2015-05-24: qty 10

## 2015-05-24 MED ORDER — SIMETHICONE 80 MG PO CHEW
80.0000 mg | CHEWABLE_TABLET | Freq: Three times a day (TID) | ORAL | Status: DC
  Administered 2015-05-24 – 2015-05-27 (×6): 80 mg via ORAL
  Filled 2015-05-24 (×8): qty 1

## 2015-05-24 MED ORDER — PHENYLEPHRINE 8 MG IN D5W 100 ML (0.08MG/ML) PREMIX OPTIME
INJECTION | INTRAVENOUS | Status: DC | PRN
  Administered 2015-05-24: 60 ug/min via INTRAVENOUS

## 2015-05-24 MED ORDER — SIMETHICONE 80 MG PO CHEW
80.0000 mg | CHEWABLE_TABLET | ORAL | Status: DC
  Administered 2015-05-24 – 2015-05-27 (×3): 80 mg via ORAL
  Filled 2015-05-24 (×3): qty 1

## 2015-05-24 MED ORDER — CEFAZOLIN SODIUM-DEXTROSE 2-3 GM-% IV SOLR
2.0000 g | INTRAVENOUS | Status: AC
  Administered 2015-05-24: 2 g via INTRAVENOUS

## 2015-05-24 MED ORDER — ONDANSETRON HCL 4 MG/2ML IJ SOLN
INTRAMUSCULAR | Status: DC | PRN
  Administered 2015-05-24: 4 mg via INTRAVENOUS

## 2015-05-24 MED ORDER — SIMETHICONE 80 MG PO CHEW: 80.0000 mg | CHEWABLE_TABLET | ORAL | Status: DC | PRN

## 2015-05-24 MED ORDER — PRENATAL MULTIVITAMIN CH
1.0000 | ORAL_TABLET | Freq: Every day | ORAL | Status: DC
  Administered 2015-05-25 – 2015-05-27 (×3): 1 via ORAL
  Filled 2015-05-24 (×3): qty 1

## 2015-05-24 MED ORDER — DIBUCAINE 1 % RE OINT: 1.0000 "application " | TOPICAL_OINTMENT | RECTAL | Status: DC | PRN

## 2015-05-24 MED ORDER — LACTATED RINGERS IV SOLN
INTRAVENOUS | Status: DC
  Administered 2015-05-24: 15:00:00 via INTRAVENOUS

## 2015-05-24 MED ORDER — DEXAMETHASONE SODIUM PHOSPHATE 4 MG/ML IJ SOLN
INTRAMUSCULAR | Status: DC | PRN
  Administered 2015-05-24: 4 mg via INTRAVENOUS

## 2015-05-24 MED ORDER — TETANUS-DIPHTH-ACELL PERTUSSIS 5-2.5-18.5 LF-MCG/0.5 IM SUSP: 0.5000 mL | Freq: Once | INTRAMUSCULAR | Status: DC

## 2015-05-24 MED ORDER — DIPHENHYDRAMINE HCL 50 MG/ML IJ SOLN: 12.5000 mg | INTRAMUSCULAR | Status: DC | PRN

## 2015-05-24 MED ORDER — SODIUM CHLORIDE 0.9 % IJ SOLN: 3.0000 mL | INTRAMUSCULAR | Status: DC | PRN

## 2015-05-24 MED ORDER — LANOLIN HYDROUS EX OINT: 1.0000 "application " | TOPICAL_OINTMENT | CUTANEOUS | Status: DC | PRN

## 2015-05-24 MED ORDER — ZOLPIDEM TARTRATE 5 MG PO TABS: 5.0000 mg | ORAL_TABLET | Freq: Every evening | ORAL | Status: DC | PRN

## 2015-05-24 MED ORDER — PHENYLEPHRINE HCL 10 MG/ML IJ SOLN
INTRAMUSCULAR | Status: DC | PRN
  Administered 2015-05-24: 80 ug via INTRAVENOUS

## 2015-05-24 MED ORDER — DIPHENHYDRAMINE HCL 25 MG PO CAPS: 25.0000 mg | ORAL_CAPSULE | Freq: Four times a day (QID) | ORAL | Status: DC | PRN

## 2015-05-24 MED ORDER — NALOXONE HCL 1 MG/ML IJ SOLN: 1.0000 ug/kg/h | INTRAVENOUS | Status: DC | PRN

## 2015-05-24 MED ORDER — SCOPOLAMINE 1 MG/3DAYS TD PT72
MEDICATED_PATCH | TRANSDERMAL | Status: AC
  Administered 2015-05-24: 1.5 mg via TRANSDERMAL
  Filled 2015-05-24: qty 1

## 2015-05-24 MED ORDER — OXYTOCIN 10 UNIT/ML IJ SOLN
40.0000 [IU] | INTRAVENOUS | Status: DC | PRN
  Administered 2015-05-24: 40 [IU] via INTRAVENOUS

## 2015-05-24 MED ORDER — NALOXONE HCL 0.4 MG/ML IJ SOLN
0.4000 mg | INTRAMUSCULAR | Status: DC | PRN
Start: 2015-05-24 — End: 2015-05-27

## 2015-05-24 MED ORDER — EPHEDRINE 5 MG/ML INJ
INTRAVENOUS | Status: AC
  Filled 2015-05-24: qty 10

## 2015-05-24 MED ORDER — ONDANSETRON HCL 4 MG/2ML IJ SOLN
INTRAMUSCULAR | Status: AC
Start: 2015-05-24 — End: 2015-05-24
  Filled 2015-05-24: qty 2

## 2015-05-24 MED ORDER — MORPHINE SULFATE (PF) 0.5 MG/ML IJ SOLN
INTRAMUSCULAR | Status: DC | PRN
  Administered 2015-05-24: .2 mg via INTRATHECAL

## 2015-05-24 MED ORDER — IBUPROFEN 600 MG PO TABS
600.0000 mg | ORAL_TABLET | Freq: Four times a day (QID) | ORAL | Status: DC
  Administered 2015-05-24 – 2015-05-27 (×12): 600 mg via ORAL
  Filled 2015-05-24 (×12): qty 1

## 2015-05-24 MED ORDER — FENTANYL CITRATE (PF) 100 MCG/2ML IJ SOLN
INTRAMUSCULAR | Status: DC | PRN
  Administered 2015-05-24: 20 ug via INTRATHECAL

## 2015-05-24 MED ORDER — OXYCODONE-ACETAMINOPHEN 5-325 MG PO TABS
1.0000 | ORAL_TABLET | ORAL | Status: DC | PRN
  Administered 2015-05-25: 1 via ORAL
  Filled 2015-05-24 (×2): qty 1

## 2015-05-24 MED ORDER — ONDANSETRON HCL 4 MG/2ML IJ SOLN: 4.0000 mg | Freq: Three times a day (TID) | INTRAMUSCULAR | Status: DC | PRN

## 2015-05-24 MED ORDER — PHENYLEPHRINE 8 MG IN D5W 100 ML (0.08MG/ML) PREMIX OPTIME
INJECTION | INTRAVENOUS | Status: AC
  Filled 2015-05-24: qty 100

## 2015-05-24 MED ORDER — OXYTOCIN 10 UNIT/ML IJ SOLN
INTRAMUSCULAR | Status: AC
  Filled 2015-05-24: qty 4

## 2015-05-24 SURGICAL SUPPLY — 25 items
CLAMP CORD UMBIL (MISCELLANEOUS) ×2 IMPLANT
CLOTH BEACON ORANGE TIMEOUT ST (SAFETY) ×3 IMPLANT
DRAPE SHEET LG 3/4 BI-LAMINATE (DRAPES) ×2 IMPLANT
DRSG OPSITE POSTOP 4X10 (GAUZE/BANDAGES/DRESSINGS) ×3 IMPLANT
DURAPREP 26ML APPLICATOR (WOUND CARE) ×3 IMPLANT
ELECT REM PT RETURN 9FT ADLT (ELECTROSURGICAL) ×3
ELECTRODE REM PT RTRN 9FT ADLT (ELECTROSURGICAL) ×1 IMPLANT
EXTRACTOR VACUUM M CUP 4 TUBE (SUCTIONS) ×1 IMPLANT
EXTRACTOR VACUUM M CUP 4' TUBE (SUCTIONS) ×1
GLOVE BIO SURGEON STRL SZ8.5 (GLOVE) ×3 IMPLANT
GOWN STRL REUS W/TWL 2XL LVL3 (GOWN DISPOSABLE) ×3 IMPLANT
GOWN STRL REUS W/TWL LRG LVL3 (GOWN DISPOSABLE) ×3 IMPLANT
NS IRRIG 1000ML POUR BTL (IV SOLUTION) ×3 IMPLANT
PACK C SECTION WH (CUSTOM PROCEDURE TRAY) ×3 IMPLANT
PAD OB MATERNITY 4.3X12.25 (PERSONAL CARE ITEMS) ×3 IMPLANT
PENCIL SMOKE EVAC W/HOLSTER (ELECTROSURGICAL) ×3 IMPLANT
RTRCTR C-SECT PINK 25CM LRG (MISCELLANEOUS) ×2 IMPLANT
SUT CHROMIC 0 CT 802H (SUTURE) ×3 IMPLANT
SUT CHROMIC 1 CTX 36 (SUTURE) ×6 IMPLANT
SUT CHROMIC 2 0 SH (SUTURE) ×3 IMPLANT
SUT MON AB 4-0 PS1 27 (SUTURE) ×3 IMPLANT
SUT VIC AB 0 CTX 36 (SUTURE) ×6
SUT VIC AB 0 CTX36XBRD ANBCTRL (SUTURE) ×2 IMPLANT
TOWEL OR 17X24 6PK STRL BLUE (TOWEL DISPOSABLE) ×3 IMPLANT
TRAY FOLEY CATH SILVER 14FR (SET/KITS/TRAYS/PACK) ×3 IMPLANT

## 2015-05-24 NOTE — Op Note (Signed)
Preop diagnosis previous cesarean section at 39 weeks desires repeat Postop diagnosis repeat low transverse cesarean section Anesthesia spinal Surgeon Dr. Francoise Ceo First assistant Dr. Coral Ceo Procedure patient placed on the operating table in the supine position abdomen prepped and draped bladder emptied with a Foley catheter a transverse incision made through the old scar carried  Down  to the rectus fascia fascia cleaned and incised the length of the incision recti muscles retracted laterally peritoneum incised longitudinally transverse incision made in the visceroperitoneum above the bladder and the bladder mobilized inferiorly transverse low uterine incision made the fluid was clear patient delivered of a female Apgar 8 and 9 weighing 5 lbs. 9 oz. the placenta was posterior fundal removed manually and sent to labor and delivery uterine cavity clean with dry laps uterine incision closed in one layer with continuous   one chromic hemostasis satisfactory  Bladder   flap reattached to a chromic uterus well contracted tubes and ovaries normal abdomen closed in layers peritoneum continuous with of 0 chromic fascia continuous with  0 dexon  and the skin closes subcuticular stitch of 4-0 Monocryl blood loss was 600 cc patient tolerated procedure well

## 2015-05-24 NOTE — Anesthesia Postprocedure Evaluation (Signed)
  Anesthesia Post-op Note  Patient: Oceanographer  Procedure(s) Performed: Procedure(s): REPEAT CESAREAN SECTION (N/A)  Patient Location: PACU  Anesthesia Type:Spinal  Level of Consciousness: awake  Airway and Oxygen Therapy: Patient Spontanous Breathing  Post-op Pain: none  Post-op Assessment: Post-op Vital signs reviewed, Patient's Cardiovascular Status Stable, Respiratory Function Stable, Patent Airway, No signs of Nausea or vomiting and Pain level controlled LLE Motor Response: Purposeful movement LLE Sensation: Tingling RLE Motor Response: Purposeful movement RLE Sensation: Tingling L Sensory Level: S1-Sole of foot, small toes R Sensory Level: S1-Sole of foot, small toes  Post-op Vital Signs: Reviewed and stable  Last Vitals:  Filed Vitals:   05/24/15 1315  BP: 111/61  Pulse: 72  Temp:   Resp: 21    Complications: No apparent anesthesia complications

## 2015-05-24 NOTE — Lactation Note (Signed)
This note was copied from the chart of Linda Shantia Malizia. Lactation Consultation Note Mom states she BF her older child for over a year who is now a young adolescent. Mom stated this baby latched great and BF wonderful for 35 minutes. Encouraged to cal for a latch score. Just got supper tray and had company. Encouraged to call for next feeding. Discussed STS and obtaining deep latch during BF.  Patient Name: Linda Duffy Today's Date: 05/24/2015 Reason for consult: Initial assessment   Maternal Data Has patient been taught Hand Expression?: Yes Does the patient have breastfeeding experience prior to this delivery?: Yes  Feeding Feeding Type: Breast Fed Length of feed: 35 min  LATCH Score/Interventions Latch: Grasps breast easily, tongue down, lips flanged, rhythmical sucking.  Audible Swallowing: Spontaneous and intermittent  Type of Nipple: Everted at rest and after stimulation  Comfort (Breast/Nipple): Soft / non-tender     Hold (Positioning): Assistance needed to correctly position infant at breast and maintain latch. Intervention(s): Support Pillows  LATCH Score: 9  Lactation Tools Discussed/Used     Consult Status Consult Status: Follow-up Date: 05/25/15 Follow-up type: In-patient    Charyl Dancer 05/24/2015, 10:12 PM

## 2015-05-24 NOTE — H&P (Signed)
  There has been no change in her history and physical since the original dictation 

## 2015-05-24 NOTE — Transfer of Care (Signed)
Immediate Anesthesia Transfer of Care Note  Patient: Linda Duffy  Procedure(s) Performed: Procedure(s): REPEAT CESAREAN SECTION (N/A)  Patient Location: PACU  Anesthesia Type:Spinal  Level of Consciousness: awake, alert  and oriented  Airway & Oxygen Therapy: Patient Spontanous Breathing  Post-op Assessment: Report given to RN and Post -op Vital signs reviewed and stable  Post vital signs: Reviewed and stable  Last Vitals:  Filed Vitals:   05/24/15 0948  BP: 128/64  Pulse: 98  Temp: 36.8 C  Resp: 16    Complications: No apparent anesthesia complications

## 2015-05-24 NOTE — Addendum Note (Signed)
Addendum  created 05/24/15 2103 by Graciela Husbands, CRNA   Modules edited: Notes Section   Notes Section:  File: [id number]

## 2015-05-24 NOTE — Anesthesia Preprocedure Evaluation (Signed)
Anesthesia Evaluation  Patient identified by MRN, date of birth, ID band Patient awake    Reviewed: Allergy & Precautions, Patient's Chart, lab work & pertinent test results  History of Anesthesia Complications Negative for: history of anesthetic complications  Airway Mallampati: I  TM Distance: >3 FB Neck ROM: Full    Dental  (+) Teeth Intact   Pulmonary former smoker,    breath sounds clear to auscultation       Cardiovascular negative cardio ROS   Rhythm:Regular     Neuro/Psych negative neurological ROS  negative psych ROS   GI/Hepatic negative GI ROS, Neg liver ROS,   Endo/Other  negative endocrine ROS  Renal/GU negative Renal ROS     Musculoskeletal   Abdominal   Peds  Hematology negative hematology ROS (+)   Anesthesia Other Findings   Reproductive/Obstetrics (+) Pregnancy                             Anesthesia Physical Anesthesia Plan  ASA: II  Anesthesia Plan: Spinal   Post-op Pain Management:    Induction:   Airway Management Planned: Nasal Cannula  Additional Equipment: None  Intra-op Plan:   Post-operative Plan:   Informed Consent: I have reviewed the patients History and Physical, chart, labs and discussed the procedure including the risks, benefits and alternatives for the proposed anesthesia with the patient or authorized representative who has indicated his/her understanding and acceptance.   Dental advisory given  Plan Discussed with: CRNA and Surgeon  Anesthesia Plan Comments:         Anesthesia Quick Evaluation

## 2015-05-24 NOTE — Anesthesia Postprocedure Evaluation (Signed)
Anesthesia Post Note  Patient: Linda Duffy  Procedure(s) Performed: Procedure(s) (LRB): REPEAT CESAREAN SECTION (N/A)  Anesthesia type: Spinal  Patient location: Mother/Baby  Post pain: Pain level controlled  Post assessment: Post-op Vital signs reviewed  Last Vitals:  Filed Vitals:   05/24/15 2035  BP: 102/58  Pulse: 66  Temp:   Resp: 18    Post vital signs: Reviewed  Level of consciousness: awake  Complications: No apparent anesthesia complications

## 2015-05-25 LAB — CBC
HEMATOCRIT: 23.8 % — AB (ref 36.0–46.0)
Hemoglobin: 8.1 g/dL — ABNORMAL LOW (ref 12.0–15.0)
MCH: 31.6 pg (ref 26.0–34.0)
MCHC: 34 g/dL (ref 30.0–36.0)
MCV: 93 fL (ref 78.0–100.0)
PLATELETS: 142 10*3/uL — AB (ref 150–400)
RBC: 2.56 MIL/uL — ABNORMAL LOW (ref 3.87–5.11)
RDW: 14.5 % (ref 11.5–15.5)
WBC: 14.6 10*3/uL — AB (ref 4.0–10.5)

## 2015-05-25 LAB — CCBB MATERNAL DONOR DRAW

## 2015-05-25 NOTE — Progress Notes (Signed)
UR chart review completed.  

## 2015-05-25 NOTE — Progress Notes (Signed)
Patient ID: Linda Duffy, female   DOB: 18-Feb-1986, 29 y.o.   MRN: [id number] Postop day 1 Blood pressure 99/82 pulse 18 respiration respiration 18 pulse 64 afebrile Abdomen soft good bowel sounds dressing dry Legs negative doing well

## 2015-05-25 NOTE — Lactation Note (Signed)
This note was copied from the chart of Linda Kismet Wehling. Lactation Consultation Note  Patient Name: Linda Duffy [id number]'[id number] Date: 05/25/2015 Reason for consult: Follow-up assessment;Infant < 6lbs Mom reports baby is cluster feeding and nursing well. Mom reports hearing some swallows with baby nursing. Basic teaching reviewed with Mom. Last void charted was earlier this morning. Mom reports she thinks baby has voided since then but does not know the time. Encouraged to call for questions/concerns.   Maternal Data    Feeding Feeding Type: Breast Fed Length of feed: 25 min  LATCH Score/Interventions Latch: Grasps breast easily, tongue down, lips flanged, rhythmical sucking.  Audible Swallowing: A few with stimulation  Type of Nipple: Everted at rest and after stimulation  Comfort (Breast/Nipple): Soft / non-tender     Hold (Positioning): No assistance needed to correctly position infant at breast. Intervention(s): Breastfeeding basics reviewed;Support Pillows;Position options;Skin to skin  LATCH Score: 9  Lactation Tools Discussed/Used     Consult Status Consult Status: Follow-up Date: 05/26/15 Follow-up type: In-patient    Alfred Levins 05/25/2015, 4:25 PM

## 2015-05-25 NOTE — Clinical Social Work Maternal (Signed)
CLINICAL SOCIAL WORK MATERNAL/CHILD NOTE  Patient Details  Name: Linda Duffy MRN: [id number] Date of Birth: 12/05/1985  Date:  05/25/2015   Clinical Social Worker Initiating Note:  Sarah Venning, LCSW and Yazmin Rodriguez BSW, MSW intern   Date/ Time Initiated:  05/25/15/1045     Child's Name:  Gia   Legal Guardian:  Mother   Need for Interpreter:  None   Date of Referral:  05/24/15     Reason for Referral:  Late or No Prenatal Care   Referral Source:  Central Nursery   Address:  [address] , [place] [address]  Phone number:  [phone]   Household Members:  Self, Minor Children, Significant Other   Natural Supports (not living in the home):  Immediate Family, Children, Spouse/significant other   Professional Supports: None   Employment: Unemployed   Type of Work:     Education:  College graduate   Financial Resources:  Medicaid   Other Resources:  WIC   Cultural/Religious Considerations Which May Impact Care:  None Reported   Strengths:  Ability to meet basic needs , Home prepared for child    Risk Factors/Current Problems:   Per MOB, she had some issues getting her Medicaid approved but would go to the ER every time she felt like something was wrong during her pregnancy. MOB stated having 3-4 PN visits with Dr. Marshall and about two visits at the [facility].  Cognitive State:  Able to Concentrate , Linear Thinking , Goal Oriented , Alert , Insightful    Mood/Affect:  Happy , Comfortable , Relaxed    CSW Assessment:  MSW intern and CSW presented in the patient's room due to MOB only having one prenatal visit in her chart. MOB's daughter was present in the room when MSW intern and CSW entered the room. MOB presented to be happy and excited as evidence by her bonding with the infant, constantly smiling and laughing during the assessment, and being openly engaged.MOB stated the birthing process went well and she was transitioning well  into postpartum. MOB denied any negative feelings about having a scheduled c-section and stated she was mentally prepared and content with the overall process. MOB stated breast feeding is going well and denied any concerns about it.  MOB reported living with FOB and her daughter.Per MOB, she feels well supported by her immediate family and has met all of the infant's basic needs. MOB stated she is currently unemployed but plans to seek employment further in the future. However, MOB stated FOB is employed and works from home so he will be around on a daily basis to help care for the infant. MOB reported being in the transition of moving to a new home. MOB expressed being well prepared to bring the infant home and having several family members helping her with the remainder of the baby supplies required.   MSW intern inquired about MOB's mental health. MOB denied any mental health history or prior perinatal mood disorders. MSW intern provided education on perinatal mood disorders. MOB denied having any questions or concerns about the topic but agreed to contact her OB if needs arise.   Per chart review, MOB only had one prenatal visit documented. MSW intern inquired about MOB's limited prenatal care. MOB voiced having issues with obtaining her Medicaid coverage but would go to the ER every time she feel the need to do so. MOB reported seeing Dr. Marshall once she got her Medicaid approved and stated she   had about 4 prenatal visits in his office. MOB also stated visiting the Women's clinic about 2 times during her pregnancy. MOB denied any further barriers in accessing care postpartum.  MSW intern informed MOB about the hospital's drug screening policy and procedures. MOB denied any substance use during the pregnancy and did not voice any concerns about the results. MOB denied having any further questions or concerns but agreed to contact CSW if needs arise.     CSW Plan/Description:   Patient/Family Education  - MSW intern provided education on perinatal mood disorders and hospital's support group, "Feelings After Birth.".  CSW to monitor UDS and MDS drug screenings and will file a report to CPS if either one comes back positive.  No barriers to discharge   Yazmin Rodriguez, Student-SW 05/25/2015, 11:14 AM  

## 2015-05-26 ENCOUNTER — Encounter (HOSPITAL_COMMUNITY): Payer: Self-pay | Admitting: Obstetrics

## 2015-05-26 NOTE — Progress Notes (Signed)
Informed that newborn tested positive for marijuana.  Referral made to DSS.  Spoke with CPI Charles Key, and informed that he will staff case with supervisor to determine if MOB will be seen at the hospital or in the community.   

## 2015-05-26 NOTE — Progress Notes (Signed)
Patient ID: Linda Duffy, female   DOB: 10/22/1985, 29 y.o.   MRN: [id number] Postop day 2 blood pressure 09/20/1964 respiration 18 pulse 53 temp 99 Fundus firm Dressing dry Legs negative doing well

## 2015-05-26 NOTE — Progress Notes (Signed)
Rec'd call back from CPI Charles Key stating that DSS will follow up with mother in the community. Newborn may be released to MOB.  

## 2015-05-27 LAB — TYPE AND SCREEN
ABO/RH(D): O POS
Antibody Screen: NEGATIVE
Unit division: 0
Unit division: 0

## 2015-05-27 NOTE — Discharge Instructions (Signed)
Discharge instructions ° °· You can wash your hair °· Shower °· Eat what you want °· Drink what you want °· See me in 6 weeks °· Your ankles are going to swell more in the next 2 weeks than when pregnant °· No sex for 6 weeks ° ° °Bearett Porcaro A, MD 05/27/2015 ° ° °

## 2015-05-27 NOTE — Progress Notes (Signed)
Patient ID: Linda Duffy, female   DOB: 1986/05/08, 29 y.o.   MRN: [id number] Postop day 3 Blood pressure 120 7721 respiration 18 pulse 63 afebrile Abdomen soft Incision clean and dry Legs negative home today

## 2015-05-27 NOTE — Discharge Summary (Signed)
Obstetric Discharge Summary Reason for Admission: cesarean section Prenatal Procedures: none Intrapartum Procedures: cesarean: low cervical, transverse Postpartum Procedures: none Complications-Operative and Postpartum: none HEMOGLOBIN  Date Value Ref Range Status  05/25/2015 8.1* 12.0 - 15.0 g/dL Final   HCT  Date Value Ref Range Status  05/25/2015 23.8* 36.0 - 46.0 % Final    Physical Exam:  General: alert Lochia: appropriate Uterine Fundus: firm Incision: healing well DVT Evaluation: No evidence of DVT seen on physical exam.  Discharge Diagnoses: Term Pregnancy-delivered  Discharge Information: Date: 05/27/2015 Activity: pelvic rest Diet: routine Medications: Percocet Condition: improved Instructions: refer to practice specific booklet Discharge to: home Follow-up Information    Follow up with Linda Cosier, MD.   Specialty:  Obstetrics and Gynecology   Contact information:   [address] [place] [address] [phone]       Newborn Data: Live born female  Birth Weight: 5 lb 14 oz (2665 g) APGAR: 9, 10  Home with mother.  Linda Duffy A 05/27/2015, 6:09 AM

## 2015-05-27 NOTE — Lactation Note (Signed)
This note was copied from the chart of Linda Shahd Coggeshall. Lactation Consultation Note' Experienced BF mom reports baby has been latching well and feeding alot through the night. Reports breasts are feeling fuller this morning and is able to hand express whitish milk. Reports baby fed about 1 hour ago for 30 min. Baby latched well and still nursing when I left room. No questions at present. To call prn  Patient Name: Linda Duffy WUJWJ'X Date: 05/27/2015 Reason for consult: Follow-up assessment   Maternal Data Formula Feeding for Exclusion: No Has patient been taught Hand Expression?: Yes Does the patient have breastfeeding experience prior to this delivery?: Yes  Feeding Feeding Type: Breast Fed Length of feed: 20 min  LATCH Score/Interventions Latch: Grasps breast easily, tongue down, lips flanged, rhythmical sucking.  Audible Swallowing: A few with stimulation  Type of Nipple: Everted at rest and after stimulation  Comfort (Breast/Nipple): Soft / non-tender     Hold (Positioning): No assistance needed to correctly position infant at breast.  LATCH Score: 9  Lactation Tools Discussed/Used     Consult Status Consult Status: Complete    Pamelia Hoit 05/27/2015, 8:13 AM

## 2015-06-06 ENCOUNTER — Inpatient Hospital Stay (HOSPITAL_COMMUNITY)
Admission: AD | Admit: 2015-06-06 | Discharge: 2015-06-06 | Disposition: A | Discharge status: Home / Self Care | Payer: Medicaid Other | Source: Ambulatory Visit | Attending: Obstetrics | Admitting: Obstetrics

## 2015-06-06 ENCOUNTER — Encounter (HOSPITAL_COMMUNITY): Payer: Self-pay | Admitting: *Deleted

## 2015-06-06 DIAGNOSIS — D509 Iron deficiency anemia, unspecified: Secondary | ICD-10-CM | POA: Insufficient documentation

## 2015-06-06 DIAGNOSIS — O9089 Other complications of the puerperium, not elsewhere classified: Secondary | ICD-10-CM | POA: Insufficient documentation

## 2015-06-06 DIAGNOSIS — Z87891 Personal history of nicotine dependence: Secondary | ICD-10-CM | POA: Diagnosis not present

## 2015-06-06 DIAGNOSIS — O9081 Anemia of the puerperium: Secondary | ICD-10-CM | POA: Diagnosis not present

## 2015-06-06 DIAGNOSIS — R55 Syncope and collapse: Secondary | ICD-10-CM | POA: Diagnosis present

## 2015-06-06 LAB — COMPREHENSIVE METABOLIC PANEL
ALBUMIN: 3.7 g/dL (ref 3.5–5.0)
ALK PHOS: 83 U/L (ref 38–126)
ALT: 9 U/L — ABNORMAL LOW (ref 14–54)
ANION GAP: 3 — AB (ref 5–15)
AST: 15 U/L (ref 15–41)
BILIRUBIN TOTAL: 0.8 mg/dL (ref 0.3–1.2)
BUN: 8 mg/dL (ref 6–20)
CALCIUM: 8.5 mg/dL — AB (ref 8.9–10.3)
CO2: 22 mmol/L (ref 22–32)
Chloride: 112 mmol/L — ABNORMAL HIGH (ref 101–111)
Creatinine, Ser: 0.68 mg/dL (ref 0.44–1.00)
GFR calc Af Amer: >60 mL/min (ref >60)
GLUCOSE: 88 mg/dL (ref 65–99)
POTASSIUM: 3.7 mmol/L (ref 3.5–5.1)
Sodium: 137 mmol/L (ref 135–145)
TOTAL PROTEIN: 7 g/dL (ref 6.5–8.1)

## 2015-06-06 LAB — URINALYSIS, ROUTINE W REFLEX MICROSCOPIC
Bilirubin Urine: NEGATIVE
GLUCOSE, UA: NEGATIVE mg/dL
Ketones, ur: NEGATIVE mg/dL
Nitrite: NEGATIVE
PH: 6 (ref 5.0–8.0)
Protein, ur: NEGATIVE mg/dL
Specific Gravity, Urine: 1.015 (ref 1.005–1.030)
Urobilinogen, UA: 0.2 mg/dL (ref 0.0–1.0)

## 2015-06-06 LAB — CBC
HCT: 27.6 % — ABNORMAL LOW (ref 36.0–46.0)
Hemoglobin: 9.5 g/dL — ABNORMAL LOW (ref 12.0–15.0)
MCH: 31.1 pg (ref 26.0–34.0)
MCHC: 34.4 g/dL (ref 30.0–36.0)
MCV: 90.5 fL (ref 78.0–100.0)
PLATELETS: 290 10*3/uL (ref 150–400)
RBC: 3.05 MIL/uL — AB (ref 3.87–5.11)
RDW: 14 % (ref 11.5–15.5)
WBC: 8.5 10*3/uL (ref 4.0–10.5)

## 2015-06-06 LAB — PROTEIN / CREATININE RATIO, URINE
CREATININE, URINE: 137 mg/dL
PROTEIN CREATININE RATIO: 0.18 mg/mg{creat} — AB (ref 0.00–0.15)
Total Protein, Urine: 24 mg/dL

## 2015-06-06 LAB — URINE MICROSCOPIC-ADD ON

## 2015-06-06 MED ORDER — OXYCODONE-ACETAMINOPHEN 5-325 MG PO TABS: 1.0000 | ORAL_TABLET | Freq: Four times a day (QID) | ORAL | Status: DC | PRN

## 2015-06-06 MED ORDER — INTEGRA 62.5-62.5-40-3 MG PO CAPS: 1.0000 | ORAL_CAPSULE | Freq: Every day | ORAL | Status: DC

## 2015-06-06 NOTE — MAU Provider Note (Signed)
Chief Complaint: No chief complaint on file.   None     SUBJECTIVE HPI: Linda Duffy is a 29 y.o. [id number] who presents 2 weeks after RLTCS at term to maternity admissions via EMS for episode of syncope.  She reports she breastfed the baby, then went to the bathroom. Then she woke up on the bathroom floor with her husband at her side and he called EMS.  Her husband reports she was sitting on the toilet leaning forward with her head on her hands and he stepped out of the bathroom to tend to the baby and she was on the floor when he returned.  She had syncopal episodes during both her pregnancies, more in her first pregnancy that occurred without warning.  She reports she was anemic in her pregnancy and has not taken the iron since she had the baby because of constipation.  She has left sided h/a since the fall but does not know if she hit her head.  Her bleeding increased yesterday and today and she reports soaking 1 pad every 2 hours today with large clots.  She has some lower abdominal pain, mostly on the right side of her incision described as intermittent, sharp, and increases with movement.  She has taken ibuprofen for her pain and it does help.  She reports she did not eat anything yesterday all day because the baby was fussy and she was busy.  Her mother passed away recently and she does not have much help at home. She denies vaginal itching/burning, urinary symptoms, dizziness, n/v, or fever/chills.     Loss of Consciousness This is a new problem. The current episode started today. The problem has been resolved. Length of episode of loss of consciousness: unknown. The symptoms are aggravated by standing and sitting up. Associated symptoms include abdominal pain and headaches. Pertinent negatives include no aura, bladder incontinence, bowel incontinence, chest pain, confusion, dizziness, fever, nausea, visual change, vomiting or weakness.    Past Medical History  Diagnosis Date  . Ovarian cyst  2012  . Abnormal Pap smear   . Anemia   . Vaginal Pap smear, abnormal   . Cancer (HCC)     cervix   Past Surgical History  Procedure Laterality Date  . Cesarean section      x1  . Dilation and curettage of uterus    . Cesarean section N/A 05/24/2015    Procedure: REPEAT CESAREAN SECTION;  Surgeon: Kathreen Cosier, MD;  Location: [facility];  Service: Obstetrics;  Laterality: N/A;   Social History   Social History  . Marital Status: Single    Spouse Name: N/A  . Number of Children: N/A  . Years of Education: N/A   Occupational History  . Not on file.   Social History Main Topics  . Smoking status: Former Smoker -- 0.25 packs/day    Quit date: 09/18/2014  . Smokeless tobacco: Not on file  . Alcohol Use: No  . Drug Use: No  . Sexual Activity: Yes    Birth Control/ Protection: None   Other Topics Concern  . Not on file   Social History Narrative   No current facility-administered medications on file prior to encounter.   No current outpatient prescriptions on file prior to encounter.   No Known Allergies  ROS:  Review of Systems  Constitutional: Negative for fever, chills and fatigue.  HENT: Negative for sinus pressure.   Eyes: Negative for photophobia.  Respiratory: Negative for shortness of breath.   Cardiovascular:  Positive for syncope. Negative for chest pain.  Gastrointestinal: Positive for abdominal pain. Negative for nausea, vomiting, diarrhea, constipation and bowel incontinence.  Genitourinary: Negative for bladder incontinence, dysuria, frequency, flank pain, vaginal bleeding, vaginal discharge, difficulty urinating, vaginal pain and pelvic pain.  Musculoskeletal: Negative for neck pain.  Neurological: Positive for headaches. Negative for dizziness and weakness.  Psychiatric/Behavioral: Negative.  Negative for confusion.     I have reviewed patient's Past Medical Hx, Surgical Hx, Family Hx, Social Hx, medications and allergies.   Physical Exam    Patient Vitals for the past 24 hrs:  BP Temp Temp src Pulse Resp SpO2  06/06/15 1342 151/91 mmHg - - 88 16 -  06/06/15 1213 138/83 mmHg - - 69 16 -  06/06/15 1204 143/85 mmHg - - 73 18 99 %  06/06/15 1200 146/94 mmHg 98.6 F (37 C) Oral 82 16 -   Constitutional: Well-developed, well-nourished female in no acute distress.  Cardiovascular: normal rate Respiratory: normal effort GI: Abd soft, mild RLQ tenderness, no rebound tenderness or guarding, mild tenderness at incision, more on right side. Pos BS x 4 Skin: incision with good approximation, no edema, erythema, or exudate MS: Extremities nontender, no edema, normal ROM Neurologic: Alert and oriented x 4.  GU: Neg CVAT.  PELVIC EXAM: Cervix pink, visually closed, without lesion, moderate amount dark red bleeding, vaginal walls and external genitalia normal Bimanual exam: Cervix 0/long/high, firm, anterior, neg CMT, uterus nontender, nonenlarged, adnexa without tenderness, enlargement, or mass   LAB RESULTS Results for orders placed or performed during the hospital encounter of 06/06/15 (from the past 24 hour(s))  CBC     Status: Abnormal   Collection Time: 06/06/15 12:51 PM  Result Value Ref Range   WBC 8.5 4.0 - 10.5 K/uL   RBC 3.05 (L) 3.87 - 5.11 MIL/uL   Hemoglobin 9.5 (L) 12.0 - 15.0 g/dL   HCT 62.127.6 (L) 30.836.0 - 65.746.0 %   MCV 90.5 78.0 - 100.0 fL   MCH 31.1 26.0 - 34.0 pg   MCHC 34.4 30.0 - 36.0 g/dL   RDW 84.614.0 96.211.5 - 95.215.5 %   Platelets 290 150 - 400 K/uL  Comprehensive metabolic panel     Status: Abnormal   Collection Time: 06/06/15 12:51 PM  Result Value Ref Range   Sodium 137 135 - 145 mmol/L   Potassium 3.7 3.5 - 5.1 mmol/L   Chloride 112 (H) 101 - 111 mmol/L   CO2 22 22 - 32 mmol/L   Glucose, Bld 88 65 - 99 mg/dL   BUN 8 6 - 20 mg/dL   Creatinine, Ser 8.410.68 0.44 - 1.00 mg/dL   Calcium 8.5 (L) 8.9 - 10.3 mg/dL   Total Protein 7.0 6.5 - 8.1 g/dL   Albumin 3.7 3.5 - 5.0 g/dL   AST 15 15 - 41 U/L   ALT 9 (L) 14  - 54 U/L   Alkaline Phosphatase 83 38 - 126 U/L   Total Bilirubin 0.8 0.3 - 1.2 mg/dL   GFR calc non Af Amer >60 >60 mL/min   GFR calc Af Amer >60 >60 mL/min   Anion gap 3 (L) 5 - 15  Protein / creatinine ratio, urine     Status: Abnormal   Collection Time: 06/06/15  1:10 PM  Result Value Ref Range   Creatinine, Urine 137.00 mg/dL   Total Protein, Urine 24 mg/dL   Protein Creatinine Ratio 0.18 (H) 0.00 - 0.15 mg/mg[Cre]  Urinalysis, Routine w reflex microscopic (not at  [facility])     Status: Abnormal   Collection Time: 06/06/15  1:10 PM  Result Value Ref Range   Color, Urine AMBER (A) YELLOW   APPearance HAZY (A) CLEAR   Specific Gravity, Urine 1.015 1.005 - 1.030   pH 6.0 5.0 - 8.0   Glucose, UA NEGATIVE NEGATIVE mg/dL   Hgb urine dipstick LARGE (A) NEGATIVE   Bilirubin Urine NEGATIVE NEGATIVE   Ketones, ur NEGATIVE NEGATIVE mg/dL   Protein, ur NEGATIVE NEGATIVE mg/dL   Urobilinogen, UA 0.2 0.0 - 1.0 mg/dL   Nitrite NEGATIVE NEGATIVE   Leukocytes, UA SMALL (A) NEGATIVE  Urine microscopic-add on     Status: Abnormal   Collection Time: 06/06/15  1:10 PM  Result Value Ref Range   Squamous Epithelial / LPF FEW (A) RARE   WBC, UA 7-10 <3 WBC/hpf   RBC / HPF 3-6 <3 RBC/hpf   Bacteria, UA MANY (A) RARE    --/--/O POS (09/28 1650)  IMAGING No results found.  MAU Management/MDM: Ordered labs and reviewed results.  Pt ate meal while in MAU and reported feeling much better after eating, h/a mostly resolved. Consult Dr Gaynell Face.  Not likely a gyn problem so recommend further work up at [facility].  Pt does not want to transport to [facility], reports feeling much better and would like to follow up outpatient.  She has PCP.  Brainstormed with pt ways to eat/drink regularly when home with baby.  Pt requested more pain medication for incision pain.  Rx for 10 more percocet but recommend she f/u with Dr Gaynell Face if incision pain persists.  Pt states understanding.  Pt stable at time of  discharge.  ASSESSMENT 1. Syncope, unspecified syncope type   2. Anemia, iron deficiency     PLAN Discharge home with bleeding precautions Pt to eat and drink regularly, sleep when baby sleeps F/U with PCP outpatient Prescription iron, Integra, sent to pt pharmacy.  Pt will pick up if insurance covers. Return to MAU/ED for emergencies    Medication List    STOP taking these medications        ferrous sulfate 325 (65 FE) MG tablet      TAKE these medications        ibuprofen 200 MG tablet  Commonly known as:  ADVIL,MOTRIN  Take 400 mg by mouth every 6 (six) hours as needed for moderate pain.     INTEGRA 62.5-62.5-40-3 MG Caps  Take 1 capsule by mouth daily.     oxyCODONE-acetaminophen 5-325 MG tablet  Commonly known as:  PERCOCET/ROXICET  Take 1 tablet by mouth every 4 (four) hours as needed for moderate pain or severe pain.     prenatal multivitamin Tabs tablet  Take 1 tablet by mouth daily at 12 noon.       Follow-up Information    Please follow up.   Why:  With primary care for syncopal episodes      Follow up with [facility] [place] [facility].   Why:  As needed for emergencies   Contact information:   [address] [id number] m[place] [address] [phone]      Sharen Counter Certified Nurse-Midwife 06/06/2015  3:07 PM

## 2015-06-06 NOTE — Discharge Instructions (Signed)
Syncope °Syncope is a medical term for fainting or passing out. This means you lose consciousness and drop to the ground. People are generally unconscious for less than 5 minutes. You may have some muscle twitches for up to 15 seconds before waking up and returning to normal. Syncope occurs more often in older adults, but it can happen to anyone. While most causes of syncope are not dangerous, syncope can be a sign of a serious medical problem. It is important to seek medical care.  °CAUSES  °Syncope is caused by a sudden drop in blood flow to the brain. The specific cause is often not determined. Factors that can bring on syncope include: °· Taking medicines that lower blood pressure. °· Sudden changes in posture, such as standing up quickly. °· Taking more medicine than prescribed. °· Standing in one place for too long. °· Seizure disorders. °· Dehydration and excessive exposure to heat. °· Low blood sugar (hypoglycemia). °· Straining to have a bowel movement. °· Heart disease, irregular heartbeat, or other circulatory problems. °· Fear, emotional distress, seeing blood, or severe pain. °SYMPTOMS  °Right before fainting, you may: °· Feel dizzy or light-headed. °· Feel nauseous. °· See all white or all black in your field of vision. °· Have cold, clammy skin. °DIAGNOSIS  °Your health care provider will ask about your symptoms, perform a physical exam, and perform an electrocardiogram (ECG) to record the electrical activity of your heart. Your health care provider may also perform other heart or blood tests to determine the cause of your syncope which may include: °· Transthoracic echocardiogram (TTE). During echocardiography, sound waves are used to evaluate how blood flows through your heart. °· Transesophageal echocardiogram (TEE). °· Cardiac monitoring. This allows your health care provider to monitor your heart rate and rhythm in real time. °· Holter monitor. This is a portable device that records your  heartbeat and can help diagnose heart arrhythmias. It allows your health care provider to track your heart activity for several days, if needed. °· Stress tests by exercise or by giving medicine that makes the heart beat faster. °TREATMENT  °In most cases, no treatment is needed. Depending on the cause of your syncope, your health care provider may recommend changing or stopping some of your medicines. °HOME CARE INSTRUCTIONS °· Have someone stay with you until you feel stable. °· Do not drive, use machinery, or play sports until your health care provider says it is okay. °· Keep all follow-up appointments as directed by your health care provider. °· Lie down right away if you start feeling like you might faint. Breathe deeply and steadily. Wait until all the symptoms have passed. °· Drink enough fluids to keep your urine clear or pale yellow. °· If you are taking blood pressure or heart medicine, get up slowly and take several minutes to sit and then stand. This can reduce dizziness. °SEEK IMMEDIATE MEDICAL CARE IF:  °· You have a severe headache. °· You have unusual pain in the chest, abdomen, or back. °· You are bleeding from your mouth or rectum, or you have black or tarry stool. °· You have an irregular or very fast heartbeat. °· You have pain with breathing. °· You have repeated fainting or seizure-like jerking during an episode. °· You faint when sitting or lying down. °· You have confusion. °· You have trouble walking. °· You have severe weakness. °· You have vision problems. °If you fainted, call your local emergency services (911 in [place] Do not drive   yourself to the hospital.    This information is not intended to replace advice given to you by your health care provider. Make sure you discuss any questions you have with your health care provider.   Document Released: 08/11/2005 Document Revised: 12/26/2014 Document Reviewed: 10/10/2011 Elsevier Interactive Patient Education 2016 [employer].  Iron Deficiency Anemia, Adult Anemia is a condition in which there are less red blood cells or hemoglobin in the blood than normal. Hemoglobin is the part of red blood cells that carries oxygen. Iron deficiency anemia is anemia caused by too little iron. It is the most common type of anemia. It may leave you tired and short of breath. CAUSES   Lack of iron in the diet.  Poor absorption of iron, as seen with intestinal disorders.  Intestinal bleeding.  Heavy periods. SIGNS AND SYMPTOMS  Mild anemia may not be noticeable. Symptoms may include:  Fatigue.  Headache.  Pale skin.  Weakness.  Tiredness.  Shortness of breath.  Dizziness.  Cold hands and feet.  Fast or irregular heartbeat. DIAGNOSIS  Diagnosis requires a thorough evaluation and physical exam by your health care provider. Blood tests are generally used to confirm iron deficiency anemia. Additional tests may be done to find the underlying cause of your anemia. These may include:  Testing for blood in the stool (fecal occult blood test).  A procedure to see inside the colon and rectum (colonoscopy).  A procedure to see inside the esophagus and stomach (endoscopy). TREATMENT  Iron deficiency anemia is treated by correcting the cause of the deficiency. Treatment may involve:  Adding iron-rich foods to your diet.  Taking iron supplements. Pregnant or breastfeeding women need to take extra iron because their normal diet usually does not provide the required amount.  Taking vitamins. Vitamin C improves the absorption of iron. Your health care provider may recommend that you take your iron tablets with a glass of orange juice or vitamin C supplement.  Medicines to make heavy menstrual flow lighter.  Surgery. HOME CARE INSTRUCTIONS   Take iron as directed by your health care provider.  If you cannot tolerate taking iron supplements by mouth, talk to your health care provider about taking them through a  vein (intravenously) or an injection into a muscle.  For the best iron absorption, iron supplements should be taken on an empty stomach. If you cannot tolerate them on an empty stomach, you may need to take them with food.  Do not drink milk or take antacids at the same time as your iron supplements. Milk and antacids may interfere with the absorption of iron.  Iron supplements can cause constipation. Make sure to include fiber in your diet to prevent constipation. A stool softener may also be recommended.  Take vitamins as directed by your health care provider.  Eat a diet rich in iron. Foods high in iron include liver, lean beef, whole-grain bread, eggs, dried fruit, and dark green leafy vegetables. SEEK IMMEDIATE MEDICAL CARE IF:   You faint. If this happens, do not drive. Call your local emergency services (911 in [place] if no other help is available.  You have chest pain.  You feel nauseous or vomit.  You have severe or increased shortness of breath with activity.  You feel weak.  You have a rapid heartbeat.  You have unexplained sweating.  You become light-headed when getting up from a chair or bed. MAKE SURE YOU:   Understand these instructions.  Will watch your condition.  Will  get help right away if you are not doing well or get worse.   This information is not intended to replace advice given to you by your health care provider. Make sure you discuss any questions you have with your health care provider.   Document Released: 08/08/2000 Document Revised: 09/01/2014 Document Reviewed: 04/18/2013 Elsevier Interactive Patient Education 2016 Elsevier Inc. High-Fiber Diet Fiber, also called dietary fiber, is a type of carbohydrate found in fruits, vegetables, whole grains, and beans. A high-fiber diet can have many health benefits. Your health care provider may recommend a high-fiber diet to help:  Prevent constipation. Fiber can make your bowel movements more  regular.  Lower your cholesterol.  Relieve hemorrhoids, uncomplicated diverticulosis, or irritable bowel syndrome.  Prevent overeating as part of a weight-loss plan.  Prevent heart disease, type 2 diabetes, and certain cancers. WHAT IS MY PLAN? The recommended daily intake of fiber includes:  38 grams for men under age 31.  30 grams for men over age 31.  25 grams for women under age 31.  21 grams for women over age 31. You can get the recommended daily intake of dietary fiber by eating a variety of fruits, vegetables, grains, and beans. Your health care provider may also recommend a fiber supplement if it is not possible to get enough fiber through your diet. WHAT DO I NEED TO KNOW ABOUT A HIGH-FIBER DIET?  Fiber supplements have not been widely studied for their effectiveness, so it is better to get fiber through food sources.  Always check the fiber content on thenutrition facts label of any prepackaged food. Look for foods that contain at least 5 grams of fiber per serving.  Ask your dietitian if you have questions about specific foods that are related to your condition, especially if those foods are not listed in the following section.  Increase your daily fiber consumption gradually. Increasing your intake of dietary fiber too quickly may cause bloating, cramping, or gas.  Drink plenty of water. Water helps you to digest fiber. WHAT FOODS CAN I EAT? Grains Whole-grain breads. Multigrain cereal. Oats and oatmeal. Brown rice. Barley. Bulgur wheat. Millet. Bran muffins. Popcorn. Rye wafer crackers. Vegetables Sweet potatoes. Spinach. Kale. Artichokes. Cabbage. Broccoli. Green peas. Carrots. Squash. Fruits Berries. Pears. Apples. Oranges. Avocados. Prunes and raisins. Dried figs. Meats and Other Protein Sources Navy, kidney, pinto, and soy beans. Split peas. Lentils. Nuts and seeds. Dairy Fiber-fortified yogurt. Beverages Fiber-fortified soy milk. Fiber-fortified orange  juice. Other Fiber bars. The items listed above may not be a complete list of recommended foods or beverages. Contact your dietitian for more options. WHAT FOODS ARE NOT RECOMMENDED? Grains White bread. Pasta made with refined flour. White rice. Vegetables Fried potatoes. Canned vegetables. Well-cooked vegetables.  Fruits Fruit juice. Cooked, strained fruit. Meats and Other Protein Sources Fatty cuts of meat. Fried Environmental education officerpoultry or fried fish. Dairy Milk. Yogurt. Cream cheese. Sour cream. Beverages Soft drinks. Other Cakes and pastries. Butter and oils. The items listed above may not be a complete list of foods and beverages to avoid. Contact your dietitian for more information. WHAT ARE SOME TIPS FOR INCLUDING HIGH-FIBER FOODS IN MY DIET?  Eat a wide variety of high-fiber foods.  Make sure that half of all grains consumed each day are whole grains.  Replace breads and cereals made from refined flour or white flour with whole-grain breads and cereals.  Replace white rice with brown rice, bulgur wheat, or millet.  Start the day with a breakfast that is  high in fiber, such as a cereal that contains at least 5 grams of fiber per serving.  Use beans in place of meat in soups, salads, or pasta.  Eat high-fiber snacks, such as berries, raw vegetables, nuts, or popcorn.   This information is not intended to replace advice given to you by your health care provider. Make sure you discuss any questions you have with your health care provider.   Document Released: 08/11/2005 Document Revised: 09/01/2014 Document Reviewed: 01/24/2014 Elsevier Interactive Patient Education [employer]

## 2015-06-06 NOTE — MAU Note (Addendum)
Arrived via EMS. States boyfriend found her on bathroom floor unresponsive. When EMS arrived, patient responded verbally appropriately. Had C/S on 09/29. States she has had increased bleeding since yesterday with increase pain in abdomen, more on R side. States incision has been oozing some, but no drainage noted at this time. C/O headache since last night. Patient is alert and oriented. Bleeding small amount. No clots. Pain with urination.

## 2016-04-29 ENCOUNTER — Encounter (HOSPITAL_COMMUNITY): Payer: Self-pay | Admitting: *Deleted

## 2016-04-29 ENCOUNTER — Inpatient Hospital Stay (HOSPITAL_COMMUNITY)
Admission: AD | Admit: 2016-04-29 | Discharge: 2016-04-29 | Disposition: A | Discharge status: Home / Self Care | Payer: Medicaid Other | Source: Ambulatory Visit | Attending: Family Medicine | Admitting: Family Medicine

## 2016-04-29 DIAGNOSIS — Z3493 Encounter for supervision of normal pregnancy, unspecified, third trimester: Secondary | ICD-10-CM

## 2016-04-29 DIAGNOSIS — Z1389 Encounter for screening for other disorder: Secondary | ICD-10-CM

## 2016-04-29 DIAGNOSIS — Z3A29 29 weeks gestation of pregnancy: Secondary | ICD-10-CM

## 2016-04-29 DIAGNOSIS — O0933 Supervision of pregnancy with insufficient antenatal care, third trimester: Secondary | ICD-10-CM | POA: Insufficient documentation

## 2016-04-29 NOTE — MAU Provider Note (Signed)
S:  Linda Duffy is a 30 y.o. [id number] at 350w6d wks here for confirmation of pregnancy.  Patient's Patient's last menstrual period was 10/03/2015 (approximate)..  Denies any vaginal bleeding, LOF, or abdominal pain. Has positive HPT 3 weeks ago; no prenatal care this pregnancy.   O:  Past Medical History:  Diagnosis Date  . Abnormal Pap smear   . Anemia   . Cancer (HCC)    cervix  . Ovarian cyst 2012  . Vaginal Pap smear, abnormal     Family History  Problem Relation Age of Onset  . Diabetes Mother   . Hypertension Mother      Vitals:   04/29/16 1130  BP: 119/69  Pulse: 107  Resp: 18  Temp: 98.6 F (37 C)   FHT 145 by doppler   General:  A&OX3 with no signs of acute distress. She appears well-developed and well-nourished. No distress.  Neck: Normal range of motion.  Pulmonary/Chest: Effort normal. No respiratory distress.  Musculoskeletal: Normal range of motion.  Neurological: She is alert and oriented to person, place, and time.  Skin: Skin is warm and dry.  GI: fundal height 26 cm; abdomen soft & non tender  A: 1. Fetal heart tones present, third trimester   2. Encounter for routine screening for malformation using ultrasonics   3. [redacted] weeks gestation of pregnancy      P: Explained benefits of taking prenatal vitamins w/folic acid early in pregnancy. [facility] OP anatomy ultrasound scheduled Discussed reasons to return to MAU  Judeth HornErin Cecilia Nishikawa, NP

## 2016-04-29 NOTE — MAU Note (Signed)
Pos HPT 3 weeks ago, wants pregnancy confirmation for medicaid.  Pt denies pain or bleeding.

## 2016-04-29 NOTE — Discharge Instructions (Signed)

## 2016-04-30 ENCOUNTER — Ambulatory Visit (HOSPITAL_COMMUNITY): Payer: Medicaid Other

## 2016-05-06 ENCOUNTER — Encounter (HOSPITAL_COMMUNITY): Payer: Self-pay

## 2016-05-06 ENCOUNTER — Ambulatory Visit (HOSPITAL_COMMUNITY)
Admission: RE | Admit: 2016-05-06 | Discharge: 2016-05-06 | Disposition: A | Discharge status: Home / Self Care | Payer: Medicaid Other | Source: Ambulatory Visit | Attending: Family Medicine | Admitting: Family Medicine

## 2016-05-06 DIAGNOSIS — Z3A29 29 weeks gestation of pregnancy: Secondary | ICD-10-CM | POA: Diagnosis not present

## 2016-05-06 DIAGNOSIS — Z1389 Encounter for screening for other disorder: Secondary | ICD-10-CM

## 2016-05-06 DIAGNOSIS — Z36 Encounter for antenatal screening of mother: Secondary | ICD-10-CM | POA: Insufficient documentation

## 2016-05-07 ENCOUNTER — Encounter: Payer: Medicaid Other | Admitting: Family

## 2016-05-14 ENCOUNTER — Encounter: Payer: Self-pay | Admitting: Family Medicine

## 2016-05-14 ENCOUNTER — Encounter: Payer: Medicaid Other | Admitting: Certified Nurse Midwife

## 2016-05-19 ENCOUNTER — Encounter: Payer: Self-pay | Admitting: *Deleted

## 2016-05-27 ENCOUNTER — Encounter: Payer: Medicaid Other | Admitting: Obstetrics and Gynecology

## 2016-06-02 ENCOUNTER — Ambulatory Visit (INDEPENDENT_AMBULATORY_CARE_PROVIDER_SITE_OTHER): Payer: Medicaid Other | Admitting: Obstetrics and Gynecology

## 2016-06-02 ENCOUNTER — Other Ambulatory Visit (HOSPITAL_COMMUNITY)
Admission: RE | Admit: 2016-06-02 | Discharge: 2016-06-02 | Disposition: A | Discharge status: Home / Self Care | Payer: Medicaid Other | Source: Ambulatory Visit | Attending: Obstetrics and Gynecology | Admitting: Obstetrics and Gynecology

## 2016-06-02 ENCOUNTER — Encounter: Payer: Self-pay | Admitting: Obstetrics and Gynecology

## 2016-06-02 VITALS — BP 121/75 | HR 78 | Wt 138.2 lb

## 2016-06-02 DIAGNOSIS — O0932 Supervision of pregnancy with insufficient antenatal care, second trimester: Secondary | ICD-10-CM | POA: Insufficient documentation

## 2016-06-02 DIAGNOSIS — O0933 Supervision of pregnancy with insufficient antenatal care, third trimester: Secondary | ICD-10-CM

## 2016-06-02 DIAGNOSIS — Z23 Encounter for immunization: Secondary | ICD-10-CM

## 2016-06-02 DIAGNOSIS — O34219 Maternal care for unspecified type scar from previous cesarean delivery: Secondary | ICD-10-CM | POA: Diagnosis not present

## 2016-06-02 DIAGNOSIS — Z113 Encounter for screening for infections with a predominantly sexual mode of transmission: Secondary | ICD-10-CM | POA: Insufficient documentation

## 2016-06-02 DIAGNOSIS — Z3493 Encounter for supervision of normal pregnancy, unspecified, third trimester: Secondary | ICD-10-CM

## 2016-06-02 DIAGNOSIS — O0930 Supervision of pregnancy with insufficient antenatal care, unspecified trimester: Secondary | ICD-10-CM | POA: Insufficient documentation

## 2016-06-02 DIAGNOSIS — Z3483 Encounter for supervision of other normal pregnancy, third trimester: Secondary | ICD-10-CM

## 2016-06-02 DIAGNOSIS — Z98891 History of uterine scar from previous surgery: Secondary | ICD-10-CM

## 2016-06-02 DIAGNOSIS — O09899 Supervision of other high risk pregnancies, unspecified trimester: Secondary | ICD-10-CM | POA: Insufficient documentation

## 2016-06-02 LAB — POCT URINALYSIS DIP (DEVICE)
Bilirubin Urine: NEGATIVE
GLUCOSE, UA: NEGATIVE mg/dL
Ketones, ur: NEGATIVE mg/dL
Leukocytes, UA: NEGATIVE
Nitrite: NEGATIVE
PH: 7 (ref 5.0–8.0)
PROTEIN: NEGATIVE mg/dL
SPECIFIC GRAVITY, URINE: 1.015 (ref 1.005–1.030)
UROBILINOGEN UA: 1 mg/dL (ref 0.0–1.0)

## 2016-06-02 MED ORDER — TETANUS-DIPHTH-ACELL PERTUSSIS 5-2.5-18.5 LF-MCG/0.5 IM SUSP
0.5000 mL | Freq: Once | INTRAMUSCULAR | Status: AC
  Administered 2016-06-02: 0.5 mL via INTRAMUSCULAR

## 2016-06-02 NOTE — Progress Notes (Signed)
New OB Note  06/02/2016   Clinic: [facility]  Chief Complaint: NOB  Transfer of Care Patient: no  History of Present Illness: Ms. Lauderbaugh is a 30 y.o. [id number] @ 34/5 weeks (based onPatient's last menstrual period was 10/03/2015 (approximate).), with the above CC. Preg complicated by has Supervision of normal pregnancy; S/P cesarean section; Late prenatal care; and Short interval between pregnancies affecting pregnancy, antepartum on her problem list., close interval pregnancy (04/2015)  Her periods were: irregular since she was breast feeding She was using no method when she conceived.  She has Negative signs or symptoms of miscarriage or preterm labor On any different medications around the time she conceived/early pregnancy: No   ROS: A 12-point review of systems was performed and negative, except as stated in the above HPI.  OBGYN History: As per HPI. OB History  Gravida Para Term Preterm AB Living  5 2 2   2 2   SAB TAB Ectopic Multiple Live Births    2   0 2    # Outcome Date GA Lbr Len/2nd Weight Sex Delivery Anes PTL Lv  5 Current           4 Term 05/24/15 [redacted]w[redacted]d  5 lb 14 oz (2.665 kg) F CS-Vac Spinal  LIV     Birth Comments: Normal exam.  3 Term 07/04/03     CS-LTranv None N LIV  2 TAB           1 TAB               Any issues with any prior pregnancies: Anemia Any prior children are healthy, doing well, without any problems or issues: yes History of pap smears: Yes. Last pap smear 2016 @ [facility] (normal). Abnormal: no History of STIs: No   Past Medical History: Past Medical History:  Diagnosis Date  . Abnormal Pap smear    had cryo after 1st c-section and nl after that  . Anemia   . Ovarian cyst 2012    Past Surgical History: Past Surgical History:  Procedure Laterality Date  . CERVIX LESION DESTRUCTION     after 1st c-section and nl paps after that  . CESAREAN SECTION     x1  . CESAREAN SECTION N/A 05/24/2015   Procedure: REPEAT  CESAREAN SECTION;  Surgeon: Kathreen Cosier, MD;  Location: [facility];  Service: Obstetrics;  Laterality: N/A;  . DILATION AND CURETTAGE OF UTERUS     x2 for EABs    Family History:  Family History  Problem Relation Age of Onset  . Diabetes Mother   . Hypertension Mother    She denies any female cancers (except cervical cancer), bleeding or blood clotting disorders.  She denies any history of mental retardation, birth defects or genetic disorders in her or the FOB's history  Social History:  Social History   Social History  . Marital status: Single    Spouse name: N/A  . Number of children: N/A  . Years of education: N/A   Occupational History  . Not on file.   Social History Main Topics  . Smoking status: Former Smoker    Packs/day: 0.25    Types: Cigarettes    Quit date: 09/18/2014  . Smokeless tobacco: Never Used  . Alcohol use No  . Drug use: No  . Sexual activity: Yes    Birth control/ protection: None   Other Topics Concern  . Not on file   Social History Narrative  .  No narrative on file  Stay at home mom  Allergy: No Known Allergies  Health Maintenance:  Mammogram Up to Date: no  Current Outpatient Medications: PNV, probiotic  Physical Exam: 1st BP 121/93 BP 121/75   Pulse 78   Wt 138 lb 3.2 oz (62.7 kg)   LMP 10/03/2015 (Approximate)   BMI 21.65 kg/m  Body mass index is 21.65 kg/m. Fundal height: 32 FHTs: 140s  General appearance: Well nourished, well developed female in no acute distress.  Neck:  Supple, normal appearance, and no thyromegaly  Cardiovascular: S1, S2 normal, no murmur, rub or gallop, regular rate and rhythm Respiratory:  Clear to auscultation bilateral. Normal respiratory effort Abdomen: gravid, nttp Neuro/Psych:  Normal mood and affect.  Skin:  Warm and dry.   Laboratory: none  Imaging:  Incomplete anatomy scan in 04/2016  Assessment: patient doing well  Plan: 1. Late prenatal care Patient states she didn't  realize she was pregnant since she had irregular periods and was breastfeeding - Pain Mgmt, Profile 6 Conf w/o mM, U  2. Encounter for supervision of normal pregnancy in third trimester, unspecified gravidity Routine care. Patient wondering about if family member could donate blood PRN. Pt was raised a JW but isn't practicing and states she would take it under life saving circumstances.  - US MFM OB FOLLOW UP; Future - Prenatal Profile - Glucose Tolerance, 1 HR (50g) w/o Fasting - Tdap (BOOSTRIX) injection 0.5 mL; Inject 0.5 mLs into the muscle once. - Culture, OB Urine - GC/Chlamydia probe amp [facility]  3. Encounter for supervision of other normal pregnancy in third trimester See above  4. S/P cesarean section Schedule repeat next visit. Patient considering BTL; pt told next visit would be latest time to sign papers  Problem list reviewed and updated.  Follow up in 1-2 weeks.  >50% of 20 min visit spent on counseling and coordination of care.     Cornelia Copaharlie Derk Doubek, Jr. MD Attending [facility](Faculty Practice)

## 2016-06-02 NOTE — Progress Notes (Signed)
Initial and 28 week education packets given Initial and 28 week labs today TDap today Declines flu

## 2016-06-03 LAB — PRENATAL PROFILE (SOLSTAS)
Antibody Screen: NEGATIVE
BASOS ABS: 0 {cells}/uL (ref 0–200)
Basophils Relative: 0 %
Eosinophils Absolute: 74 cells/uL (ref 15–500)
Eosinophils Relative: 1 %
HEMATOCRIT: 27.2 % — AB (ref 35.0–45.0)
HEP B S AG: NEGATIVE
HIV: NONREACTIVE
Hemoglobin: 9.1 g/dL — ABNORMAL LOW (ref 11.7–15.5)
LYMPHS ABS: 1924 {cells}/uL (ref 850–3900)
Lymphocytes Relative: 26 %
MCH: 31 pg (ref 27.0–33.0)
MCHC: 33.5 g/dL (ref 32.0–36.0)
MCV: 92.5 fL (ref 80.0–100.0)
MONO ABS: 444 {cells}/uL (ref 200–950)
MPV: 9.3 fL (ref 7.5–12.5)
Monocytes Relative: 6 %
NEUTROS PCT: 67 %
Neutro Abs: 4958 cells/uL (ref 1500–7800)
Platelets: 203 10*3/uL (ref 140–400)
RBC: 2.94 MIL/uL — AB (ref 3.80–5.10)
RDW: 13.8 % (ref 11.0–15.0)
Rh Type: POSITIVE
Rubella: 1.67 Index — ABNORMAL HIGH (ref <0.90)
WBC: 7.4 10*3/uL (ref 3.8–10.8)

## 2016-06-03 LAB — CULTURE, OB URINE: ORGANISM ID, BACTERIA: NO GROWTH

## 2016-06-03 LAB — GC/CHLAMYDIA PROBE AMP (~~LOC~~) NOT AT ARMC
Chlamydia: NEGATIVE
Neisseria Gonorrhea: NEGATIVE

## 2016-06-03 LAB — GLUCOSE TOLERANCE, 1 HOUR (50G) W/O FASTING: Glucose, 1 Hr, gestational: 98 mg/dL (ref <140)

## 2016-06-06 LAB — PAIN MGMT, PROFILE 6 CONF W/O MM, U
6 ACETYLMORPHINE: NEGATIVE ng/mL (ref <10)
ALCOHOL METABOLITES: NEGATIVE ng/mL (ref <500)
Amphetamines: NEGATIVE ng/mL (ref <500)
Barbiturates: NEGATIVE ng/mL (ref <300)
Benzodiazepines: NEGATIVE ng/mL (ref <100)
COCAINE METABOLITE: NEGATIVE ng/mL (ref <150)
Creatinine: 77.3 mg/dL (ref >=20.0)
MARIJUANA METABOLITE: 347 ng/mL — AB (ref <5)
MARIJUANA METABOLITE: POSITIVE ng/mL — AB (ref <20)
Methadone Metabolite: NEGATIVE ng/mL (ref <100)
OXYCODONE: NEGATIVE ng/mL (ref <100)
Opiates: NEGATIVE ng/mL (ref <100)
Oxidant: NEGATIVE ug/mL (ref <200)
PHENCYCLIDINE: NEGATIVE ng/mL (ref <25)
PLEASE NOTE: 0
pH: 7.7 (ref 4.5–9.0)

## 2016-06-09 ENCOUNTER — Encounter: Payer: Self-pay | Admitting: Obstetrics and Gynecology

## 2016-06-09 DIAGNOSIS — O99019 Anemia complicating pregnancy, unspecified trimester: Secondary | ICD-10-CM | POA: Insufficient documentation

## 2016-06-09 DIAGNOSIS — O99012 Anemia complicating pregnancy, second trimester: Secondary | ICD-10-CM | POA: Insufficient documentation

## 2016-06-09 DIAGNOSIS — F1210 Cannabis abuse, uncomplicated: Secondary | ICD-10-CM | POA: Insufficient documentation

## 2016-06-11 ENCOUNTER — Encounter (HOSPITAL_COMMUNITY): Payer: Self-pay

## 2016-06-11 ENCOUNTER — Inpatient Hospital Stay (HOSPITAL_COMMUNITY)
Admission: AD | Admit: 2016-06-11 | Discharge: 2016-06-11 | Disposition: A | Discharge status: Home / Self Care | Payer: Medicaid Other | Source: Ambulatory Visit | Attending: Family Medicine | Admitting: Family Medicine

## 2016-06-11 ENCOUNTER — Ambulatory Visit (INDEPENDENT_AMBULATORY_CARE_PROVIDER_SITE_OTHER): Payer: Medicaid Other | Admitting: Advanced Practice Midwife

## 2016-06-11 VITALS — BP 136/76 | HR 78 | Wt 133.2 lb

## 2016-06-11 DIAGNOSIS — Z3483 Encounter for supervision of other normal pregnancy, third trimester: Secondary | ICD-10-CM | POA: Diagnosis not present

## 2016-06-11 DIAGNOSIS — O4700 False labor before 37 completed weeks of gestation, unspecified trimester: Secondary | ICD-10-CM | POA: Insufficient documentation

## 2016-06-11 DIAGNOSIS — O99019 Anemia complicating pregnancy, unspecified trimester: Secondary | ICD-10-CM

## 2016-06-11 DIAGNOSIS — O99013 Anemia complicating pregnancy, third trimester: Secondary | ICD-10-CM

## 2016-06-11 DIAGNOSIS — O34211 Maternal care for low transverse scar from previous cesarean delivery: Secondary | ICD-10-CM | POA: Diagnosis not present

## 2016-06-11 DIAGNOSIS — D649 Anemia, unspecified: Secondary | ICD-10-CM

## 2016-06-11 DIAGNOSIS — O4703 False labor before 37 completed weeks of gestation, third trimester: Secondary | ICD-10-CM

## 2016-06-11 DIAGNOSIS — Z98891 History of uterine scar from previous surgery: Secondary | ICD-10-CM

## 2016-06-11 DIAGNOSIS — Z3A36 36 weeks gestation of pregnancy: Secondary | ICD-10-CM | POA: Insufficient documentation

## 2016-06-11 DIAGNOSIS — O34219 Maternal care for unspecified type scar from previous cesarean delivery: Secondary | ICD-10-CM | POA: Diagnosis not present

## 2016-06-11 DIAGNOSIS — O479 False labor, unspecified: Secondary | ICD-10-CM | POA: Insufficient documentation

## 2016-06-11 DIAGNOSIS — D573 Sickle-cell trait: Secondary | ICD-10-CM | POA: Diagnosis not present

## 2016-06-11 LAB — IRON AND TIBC
IRON: 63 ug/dL (ref 28–170)
SATURATION RATIOS: 10 % — AB (ref 10.4–31.8)
TIBC: 624 ug/dL — AB (ref 250–450)
UIBC: 561 ug/dL

## 2016-06-11 LAB — POCT URINALYSIS DIP (DEVICE)
Bilirubin Urine: NEGATIVE
GLUCOSE, UA: NEGATIVE mg/dL
Hgb urine dipstick: NEGATIVE
LEUKOCYTES UA: NEGATIVE
Nitrite: NEGATIVE
PROTEIN: NEGATIVE mg/dL
SPECIFIC GRAVITY, URINE: 1.015 (ref 1.005–1.030)
Urobilinogen, UA: 1 mg/dL (ref 0.0–1.0)
pH: 6 (ref 5.0–8.0)

## 2016-06-11 LAB — FERRITIN: FERRITIN: 13 ng/mL (ref 11–307)

## 2016-06-11 MED ORDER — PRENATAL MULTIVITAMIN CH: 1.0000 | ORAL_TABLET | Freq: Every day | ORAL | 6 refills | Status: DC

## 2016-06-11 NOTE — Progress Notes (Signed)
C/o urinary urgency, frequency - will run ua today. Declined flu shot.

## 2016-06-11 NOTE — Progress Notes (Signed)
   PRENATAL VISIT NOTE  Subjective:  Linda Duffy is a 30 y.o. [id number] at 5654w0d being seen today for ongoing prenatal care.  She is currently monitored for the following issues for this low-risk pregnancy and has Supervision of normal pregnancy; S/P cesarean section; Late prenatal care; Short interval between pregnancies affecting pregnancy, antepartum; Marijuana abuse; and Anemia in pregnancy on her problem list. Patient reports abdominal pain that started yesterday; feels tightening over her whole uterus. The pain starts in her lower back and radiates down her legs. She was not able to sleep last night and feels short of breath as she is breathing through the pain. Her pain was not improved with walking or rest.   Patient reports backache.  Contractions: Irregular. Vag. Bleeding: None.  Movement: Present. Denies leaking of fluid.   The following portions of the patient's history were reviewed and updated as appropriate: allergies, current medications, past family history, past medical history, past social history, past surgical history and problem list. Problem list updated.  Objective:   Vitals:   06/11/16 1336  BP: 136/76  Pulse: 78  Weight: 133 lb 3.2 oz (60.4 kg)    Fetal Status: Fetal Heart Rate (bpm): 153   Movement: Present     General:  Alert, oriented and cooperative. Patient appears in pain and is unable to sit still.   Skin: Skin is warm and dry. No rash noted.   Cardiovascular: Normal heart rate noted  Respiratory: Normal respiratory effort, no problems with respiration noted  Abdomen: Soft, gravid, appropriate for gestational age. Pain/Pressure: Present     Pelvic:  Cervical exam performed      .5/50/-2  Extremities: Normal range of motion.  Edema: None  Mental Status: Normal mood and affect. Normal behavior. Normal judgment and thought content.   Patient appears to be laboring by her contraction pattern (three in 15 min) and pain description.  Assessment and Plan:    Pregnancy: [id number] at 4554w0d  1. Encounter for supervision of other normal pregnancy in third trimester -After consulting with Dr. Adrian BlackwaterStinson, patient transferred to MAU for labor evaluation.  - Culture, beta strep (group b only) - Prenatal Vit-Fe Fumarate-FA (PRENATAL MULTIVITAMIN) TABS tablet; Take 1 tablet by mouth daily at 12 noon.  Dispense: 30 tablet; Refill: 6  2. S/P cesarean section -Patient signed VBAC form in the office today.  3. Anemia affecting pregnancy, antepartum Patient was transferred to MAU before labs could be drawn. Spoke with MAU provider, and she will order Ferritin, TIBC and Hemoglobinopathy Evaluation.  - Ferritin - Iron Binding Cap (TIBC) - Hemoglobinopathy Evaluation  Term labor symptoms and general obstetric precautions including but not limited to vaginal bleeding, contractions, leaking of fluid and fetal movement were reviewed in detail with the patient. Please refer to After Visit Summary for other counseling recommendations.  Patient to return in one week for 37 prenatal visit.   Linda Duffy, CNM

## 2016-06-11 NOTE — MAU Note (Signed)
Pt sent from Clinic for labor evaluation. Pt c/o contractions since last night that are irregular. Pt c/o pressure on her tailbone and lower back. Pt states her cervix was closed in the office. Pt denies bleeding and leaking of fluid. Pt states baby is moving normally.

## 2016-06-11 NOTE — Progress Notes (Signed)
Patient taken to MAU for evaluation per order. Report called by Wynona NeatMarie Williams,CNM/ Kathryn Kooistra.

## 2016-06-11 NOTE — MAU Provider Note (Signed)
  Linda Duffy is a 30 y.o. [id number] at 6662w0d here for rule out labor. She was sent up from her OB clinic due to frequent contractions for 1.5 days, about every 5 min. She is a repeat C/S x2. When patient arrived to MAU, her contractions stopped, and never restarted for the 1.2 hours she was here. In office was dilated to 0.5/50/-2, after 1 hour in MAU, she had no cervical dilation change, barely 0.5/50/-4. She is not currently in labor. She is to call the office tomorrow to get her cesarean section scheduled for 39 weeks, and follow up for routine OB visits. NST was reactive today.  I personally reviewed the patient's NST today, found to be REACTIVE. 145 bpm, mod var, +accels, no decels. CTX: None.   Cleda ClarksElizabeth W. Mumaw, DO  OB Fellow [facility]

## 2016-06-12 ENCOUNTER — Inpatient Hospital Stay (HOSPITAL_COMMUNITY)
Admission: AD | Admit: 2016-06-12 | Discharge: 2016-06-12 | Disposition: A | Discharge status: Home / Self Care | Payer: Medicaid Other | Source: Ambulatory Visit | Attending: Obstetrics & Gynecology | Admitting: Obstetrics & Gynecology

## 2016-06-12 DIAGNOSIS — Z3A36 36 weeks gestation of pregnancy: Secondary | ICD-10-CM | POA: Diagnosis not present

## 2016-06-12 DIAGNOSIS — Z87891 Personal history of nicotine dependence: Secondary | ICD-10-CM | POA: Diagnosis not present

## 2016-06-12 DIAGNOSIS — Z3483 Encounter for supervision of other normal pregnancy, third trimester: Secondary | ICD-10-CM | POA: Diagnosis not present

## 2016-06-12 NOTE — MAU Provider Note (Signed)
Chief Complaint:  Non-stress Test    HPI: Linda Duffy is a 30 y.o. [id number] at [redacted]w[redacted]d who presents to maternity admissions, brought in by bail bond agent in handcuffs, asked to be sure patient and baby are healthy in order to be brought in front of court.  Denies  leakage of fluid or vaginal bleeding. Good fetal movement. Seen yesterday in MAU, still feels occasional contractions, but not severe. Yesterday had BPP, 8/8, and was found to be 1 cm dilated. Patient states shortly after visiting MAU, she was "arrested" and has not been provided with a lot of water or snacks/meals since (about >12 hours).    Past Medical History: Past Medical History:  Diagnosis Date  . Abnormal Pap smear    had cryo after 1st c-section and nl after that  . Anemia   . Ovarian cyst 2012    Past obstetric history: OB History  Gravida Para Term Preterm AB Living  5 2 2   2 2   SAB TAB Ectopic Multiple Live Births    2   0 2    # Outcome Date GA Lbr Len/2nd Weight Sex Delivery Anes PTL Lv  5 Current           4 Term 05/24/15 [redacted]w[redacted]d  5 lb 14 oz (2.665 kg) F CS-Vac Spinal  LIV     Birth Comments: Normal exam.  3 Term 07/04/03     CS-LTranv None N LIV  2 TAB           1 TAB               Past Surgical History: Past Surgical History:  Procedure Laterality Date  . CERVIX LESION DESTRUCTION     after 1st c-section and nl paps after that  . CESAREAN SECTION     x1  . CESAREAN SECTION N/A 05/24/2015   Procedure: REPEAT CESAREAN SECTION;  Surgeon: Kathreen Cosier, MD;  Location: [facility];  Service: Obstetrics;  Laterality: N/A;  . DILATION AND CURETTAGE OF UTERUS     x2 for EABs     Family History: Family History  Problem Relation Age of Onset  . Diabetes Mother   . Hypertension Mother     Social History: Social History  Substance Use Topics  . Smoking status: Former Smoker    Packs/day: 0.25    Types: Cigarettes    Quit date: 09/18/2014  . Smokeless tobacco: Never Used  . Alcohol use No     Allergies: No Known Allergies  Meds:  Prescriptions Prior to Admission  Medication Sig Dispense Refill Last Dose  . Probiotic Product (PROBIOTIC DAILY PO) Take by mouth.   06/11/2016 at Unknown time  . Prenatal Vit-Fe Fumarate-FA (PRENATAL MULTIVITAMIN) TABS tablet Take 1 tablet by mouth daily at 12 noon. 30 tablet 6     I have reviewed patient's Past Medical Hx, Surgical Hx, Family Hx, Social Hx, medications and allergies.   ROS:  A comprehensive ROS was negative except per HPI.    Physical Exam  Patient Vitals for the past 24 hrs:  BP Temp Temp src Pulse  06/12/16 1118 105/76 98.5 F (36.9 C) Oral 120   Constitutional: Well-developed, well-nourished female in no acute distress.  Cardiovascular: normal rate Respiratory: normal effort GI: Abd soft, non-tender, gravid appropriate for gestational age. Pos BS x 4 MS: Extremities nontender, no edema, normal ROM Neurologic: Alert and oriented x 4.  GU: Neg CVAT. Pelvic: NEFG, physiologic discharge, no blood, no CMT. SVE:  Dilation: Fingertip Effacement (%): Thick Station: Ballotable Exam by:: Dr. Omer JackMumaw  FHT:  Baseline 130, moderate variability, accelerations present, one variable deceleration during contraction <10 sec and return to baseline quickly. Contractions: Irritable, occasional contractions, patient comfortable through them.   Labs: Results for orders placed or performed during the hospital encounter of 06/11/16 (from the past 24 hour(s))  Iron and TIBC     Status: Abnormal   Collection Time: 06/11/16  3:13 PM  Result Value Ref Range   Iron 63 28 - 170 ug/dL   TIBC 914624 (H) 782250 - 956450 ug/dL   Saturation Ratios 10 (L) 10.4 - 31.8 %   UIBC 561 ug/dL  Ferritin     Status: None   Collection Time: 06/11/16  3:13 PM  Result Value Ref Range   Ferritin 13 11 - 307 ng/mL    Imaging:  No results found.  MAU Course: NST reactive - reviewed with attending Dr. Erin FullingHarraway-Smith who agrees. Great variability with  multiple accels. BPP yesterday was 8/8. No cervical change from yesterday and patient barely feels the contractions. Note given to patient/bail bond agent, to be taken to court for 2pm appointment.  MDM: Plan of care reviewed with patient, including labs and tests ordered and medical treatment. Patient is not currently in active labor. NST is reactive and BPP yesterday is 8/8. Patient was reassured that she is OK for discharge, requested to have water with her at all times to stay hydrated and to have snacks.   Assessment: 1. Encounter for supervision of other normal pregnancy in third trimester     Plan: Discharge home in stable condition, in the custody of the bail bond agent.  Preterm Labor precautions and fetal kick counts      Medication List    TAKE these medications   prenatal multivitamin Tabs tablet Take 1 tablet by mouth daily at 12 noon.   PROBIOTIC DAILY PO Take by mouth.       [address] Woodland MontroseMumaw, [place]DO 06/12/2016 12:19 PM

## 2016-06-12 NOTE — Discharge Instructions (Signed)

## 2016-06-12 NOTE — MAU Note (Signed)
Pt here with law enforcement. Need to have pregnancy cleared for arraignment PLAN: there yesterday for ctx dialted 1cm. deneis any pain today and reports good fetal movement

## 2016-06-13 LAB — HEMOGLOBINOPATHY EVALUATION
Hgb A2 Quant: 3.8 % — ABNORMAL HIGH (ref 0.7–3.1)
Hgb A: 58.1 % — ABNORMAL LOW (ref 94.0–98.0)
Hgb C: 0 %
Hgb F Quant: 0 % (ref 0.0–2.0)
Hgb S Quant: 38.1 % — ABNORMAL HIGH

## 2016-06-13 LAB — CULTURE, BETA STREP (GROUP B ONLY)

## 2016-06-23 ENCOUNTER — Ambulatory Visit (HOSPITAL_COMMUNITY): Payer: Medicaid Other

## 2016-06-23 ENCOUNTER — Telehealth: Payer: Self-pay | Admitting: Family Medicine

## 2016-06-23 NOTE — Telephone Encounter (Signed)
[place] [facility] [place] [facility] [name] was released today. She came me the telephone number the patient gave them at time of her departure. [phone]. When I called that number the person who answered stated I had the wrong number.

## 2016-06-24 ENCOUNTER — Ambulatory Visit (HOSPITAL_COMMUNITY): Payer: Medicaid Other

## 2016-06-25 ENCOUNTER — Encounter: Payer: Self-pay | Admitting: Obstetrics & Gynecology

## 2016-06-25 ENCOUNTER — Ambulatory Visit (HOSPITAL_COMMUNITY)
Admission: RE | Admit: 2016-06-25 | Discharge: 2016-06-25 | Disposition: A | Discharge status: Home / Self Care | Payer: Medicaid Other | Source: Ambulatory Visit | Attending: Obstetrics and Gynecology | Admitting: Obstetrics and Gynecology

## 2016-06-25 ENCOUNTER — Ambulatory Visit (INDEPENDENT_AMBULATORY_CARE_PROVIDER_SITE_OTHER): Payer: Medicaid Other | Admitting: Obstetrics & Gynecology

## 2016-06-25 VITALS — BP 123/59 | HR 93 | Wt 144.0 lb

## 2016-06-25 DIAGNOSIS — O34219 Maternal care for unspecified type scar from previous cesarean delivery: Secondary | ICD-10-CM | POA: Diagnosis not present

## 2016-06-25 DIAGNOSIS — Z3493 Encounter for supervision of normal pregnancy, unspecified, third trimester: Secondary | ICD-10-CM

## 2016-06-25 DIAGNOSIS — D573 Sickle-cell trait: Secondary | ICD-10-CM | POA: Insufficient documentation

## 2016-06-25 DIAGNOSIS — Z362 Encounter for other antenatal screening follow-up: Secondary | ICD-10-CM | POA: Diagnosis present

## 2016-06-25 DIAGNOSIS — O99013 Anemia complicating pregnancy, third trimester: Secondary | ICD-10-CM

## 2016-06-25 DIAGNOSIS — O99019 Anemia complicating pregnancy, unspecified trimester: Secondary | ICD-10-CM | POA: Insufficient documentation

## 2016-06-25 DIAGNOSIS — Z3A36 36 weeks gestation of pregnancy: Secondary | ICD-10-CM | POA: Diagnosis not present

## 2016-06-25 DIAGNOSIS — Z3483 Encounter for supervision of other normal pregnancy, third trimester: Secondary | ICD-10-CM

## 2016-06-25 DIAGNOSIS — Z98891 History of uterine scar from previous surgery: Secondary | ICD-10-CM

## 2016-06-25 HISTORY — DX: Sickle-cell trait: D57.3

## 2016-06-25 MED ORDER — FERROUS SULFATE 325 (65 FE) MG PO TABS: 325.0000 mg | ORAL_TABLET | Freq: Two times a day (BID) | ORAL | 1 refills | Status: DC

## 2016-06-25 NOTE — Patient Instructions (Signed)
Cesarean Delivery Cesarean delivery is the birth of a baby through a cut (incision) in the abdomen and womb (uterus).  LET YOUR HEALTH CARE PROVIDER KNOW ABOUT:  All medicines you are taking, including vitamins, herbs, eye drops, creams, and over-the-counter medicines.  Previous problems you or members of your family have had with the use of anesthetics.  Any bleeding or blood clotting disorders you have.  Family history of blood clots or bleeding disorders.  Any history of deep vein thrombosis (DVT) or pulmonary embolism (PE).  Previous surgeries you have had.  Medical conditions you have.  Any allergies you have.  Complicationsinvolving the pregnancy. RISKS AND COMPLICATIONS  Generally, this is a safe procedure. However, as with any procedure, complications can occur. Possible complications include:  Bleeding.  Infection.  Blood clots.  Injury to surrounding organs.  Problems with anesthesia.  Injury to the baby. BEFORE THE PROCEDURE   You may be given an antacid medicine to drink. This will prevent acid contents in your stomach from going into your lungs if you vomit during the surgery.  You may be given an antibiotic medicine to prevent infection. PROCEDURE   To prevent infection of your incision:  Hair may be removed from your pubic area if it is near your incision.  The skin of your pubic area and lower abdomen will be cleaned with a germ-killing solution (antiseptic).  A tube (Foley catheter) will be placed in your bladder to drain your urine from your bladder into a bag. This keeps your bladder empty during surgery.  An IV tube will be placed in your vein.  You may be given medicine to numb the lower half of your body (regional anesthetic). If you were in labor, you may have already had an epidural in place which can be used in both labor and cesarean delivery. You may possibly be given medicine to make you sleep (general anesthetic) though this is not as  common.  Your heart rate and your baby's heart rate will be monitored.  An incision will be made in your abdomen that extends to your uterus. There are 2 basic kinds of incisions:  The horizontal (transverse) incision. Horizontal incisions are from side to side and are used for most routine cesarean deliveries.  The vertical incision. The vertical incision is from the top of the abdomen to the bottom and is less commonly used. It is often done for women who have a serious complication (extreme prematurity) or under emergency situations.  The horizontal and vertical incisions may both be used at the same time. However, this is very uncommon.  An incision is then made in your uterus to deliver the baby.  Your baby will be delivered.  Your health care provider may place the baby on your chest. It is important to keep the baby warm. Your health care provider will dry off the baby, place the baby directly on your bare skin, and cover the baby with warm, dry blankets.  Both incisions will be closed with absorbable stitches. AFTER THE PROCEDURE   If you were awake during the surgery, you will see your baby right away. If you were asleep, you will see your baby as soon as you are awake.  You may breastfeed your baby after surgery.  You may be able to get up and walk the same day as the surgery. If you need to stay in bed for a period of time, you will receive help to turn, cough, and take deep breaths after   surgery. This helps prevent lung problems such as pneumonia.  Do not get out of bed alone the first time after surgery. You will need help getting out of bed until you are able to do this by yourself.  You may be able to shower the day after your cesarean delivery. After the bandage (dressing) is taken off the incision site, a nurse will assist you to shower if you would like help.  You may be directed to take actions to help prevent blood clots in your legs. These may  include:  Walking shortly after surgery, with someone assisting you. Moving around after surgery helps to improve blood flow.  Wearing compression stockings or using different types of devices.  Taking medicines to thin your blood (anticoagulants) if you are at high risk for DVT or PE.  Save any blood clots that you pass from your vagina. If you pass a clot while on the toilet, do not flush it. Call for the nurse. Tell the nurse if you think you are bleeding too much or passing too many clots.  You will be given medicine for pain and nausea as needed. Let your health care providers know if you are hurting. You may also be given an antibiotic to prevent an infection.  Your IV tube will be taken out when you are drinking a reasonable amount of fluids. The Foley catheter is taken out when you are up and walking.  If your blood type is Rh negative and your baby's blood type is Rh positive, you will be given a shot of anti-D immune globulin. This shot prevents you from having Rh problems with a future pregnancy. You should get the shot even if you had your tubes tied (tubal ligation).  If you are allowed to take the baby for a walk, place the baby in the bassinet and push it.   This information is not intended to replace advice given to you by your health care provider. Make sure you discuss any questions you have with your health care provider.   Document Released: 08/11/2005 Document Revised: 05/02/2015 Document Reviewed: 04/07/2012 Elsevier Interactive Patient Education 2016 Elsevier Inc.  

## 2016-06-25 NOTE — Progress Notes (Signed)
   PRENATAL VISIT NOTE  Subjective:  Linda Duffy is a 30 y.o. [id number] at 451w0d being seen today for ongoing prenatal care.  She is currently monitored for the following issues for this low-risk pregnancy and has Supervision of normal pregnancy; S/P cesarean section; Late prenatal care; Short interval between pregnancies affecting pregnancy, antepartum; Marijuana abuse; Anemia in pregnancy; Preterm contractions; and Hemoglobin A-S genotype (HCC) on her problem list.  Patient reports no complaints.  Contractions: Irregular. Vag. Bleeding: None.  Movement: Present. Denies leaking of fluid.   The following portions of the patient's history were reviewed and updated as appropriate: allergies, current medications, past family history, past medical history, past social history, past surgical history and problem list. Problem list updated.  Objective:   Vitals:   06/25/16 1318  BP: (!) 123/59  Pulse: 93  Weight: 144 lb (65.3 kg)    Fetal Status: Fetal Heart Rate (bpm): 155 Fundal Height: 39 cm Movement: Present     General:  Alert, oriented and cooperative. Patient is in no acute distress.  Skin: Skin is warm and dry. No rash noted.   Cardiovascular: Normal heart rate noted  Respiratory: Normal respiratory effort, no problems with respiration noted  Abdomen: Soft, gravid, appropriate for gestational age. Pain/Pressure: Present     Pelvic:  Cervical exam deferred        Extremities: Normal range of motion.  Edema: None  Mental Status: Normal mood and affect. Normal behavior. Normal judgment and thought content.   Assessment and Plan:  Pregnancy: [id number] at 1151w0d  1. Hemoglobin A-S genotype (HCC) 2. Anemia during pregnancy in third trimester - ferrous sulfate (FERROUSUL) 325 (65 FE) MG tablet; Take 1 tablet (325 mg total) by mouth 2 (two) times daily.  Dispense: 60 tablet; Refill: 1  3. S/P cesarean section Order sent in for booking RCS next week   4. Encounter for supervision of other  normal pregnancy in third trimester Term labor symptoms and general obstetric precautions including but not limited to vaginal bleeding, contractions, leaking of fluid and fetal movement were reviewed in detail with the patient. Please refer to After Visit Summary for other counseling recommendations.  Return in about 6 weeks (around 08/06/2016) for Postpartum check.  Tereso NewcomerUgonna A Jeanenne Licea, MD

## 2016-06-26 ENCOUNTER — Encounter (HOSPITAL_COMMUNITY): Payer: Self-pay | Admitting: *Deleted

## 2016-06-26 ENCOUNTER — Other Ambulatory Visit: Payer: Self-pay | Admitting: Obstetrics and Gynecology

## 2016-06-26 ENCOUNTER — Telehealth (HOSPITAL_COMMUNITY): Payer: Self-pay | Admitting: *Deleted

## 2016-06-26 NOTE — Telephone Encounter (Signed)
Preadmission screen  

## 2016-06-27 ENCOUNTER — Encounter (HOSPITAL_COMMUNITY): Payer: Self-pay

## 2016-07-02 NOTE — Anesthesia Preprocedure Evaluation (Addendum)
Anesthesia Evaluation  Patient identified by MRN, date of birth, ID band Patient awake    Reviewed: Allergy & Precautions, NPO status , Patient's Chart, lab work & pertinent test results  Airway Mallampati: I  TM Distance: >3 FB Neck ROM: Full    Dental no notable dental hx. (+) Teeth Intact   Pulmonary former smoker,    Pulmonary exam normal breath sounds clear to auscultation       Cardiovascular negative cardio ROS Normal cardiovascular exam Rhythm:Regular Rate:Normal     Neuro/Psych negative neurological ROS  negative psych ROS   GI/Hepatic negative GI ROS, Neg liver ROS,   Endo/Other  negative endocrine ROS  Renal/GU negative Renal ROS  negative genitourinary   Musculoskeletal negative musculoskeletal ROS (+)   Abdominal   Peds  Hematology  (+) anemia ,   Anesthesia Other Findings   Reproductive/Obstetrics (+) Pregnancy Previous C/section                            Anesthesia Physical Anesthesia Plan  ASA: II  Anesthesia Plan: Spinal   Post-op Pain Management:    Induction:   Airway Management Planned: Natural Airway  Additional Equipment:   Intra-op Plan:   Post-operative Plan:   Informed Consent: I have reviewed the patients History and Physical, chart, labs and discussed the procedure including the risks, benefits and alternatives for the proposed anesthesia with the patient or authorized representative who has indicated his/her understanding and acceptance.     Plan Discussed with: Anesthesiologist, CRNA and Surgeon  Anesthesia Plan Comments:         Anesthesia Quick Evaluation

## 2016-07-03 ENCOUNTER — Encounter (HOSPITAL_COMMUNITY)
Admission: RE | Admit: 2016-07-03 | Discharge: 2016-07-03 | Disposition: A | Discharge status: Home / Self Care | Payer: Medicaid Other | Source: Ambulatory Visit | Attending: Obstetrics and Gynecology | Admitting: Obstetrics and Gynecology

## 2016-07-03 DIAGNOSIS — Z01812 Encounter for preprocedural laboratory examination: Secondary | ICD-10-CM | POA: Insufficient documentation

## 2016-07-03 LAB — CBC WITH DIFFERENTIAL/PLATELET
Basophils Absolute: 0.1 10*3/uL (ref 0.0–0.1)
Basophils Relative: 1 %
EOS ABS: 0.1 10*3/uL (ref 0.0–0.7)
Eosinophils Relative: 1 %
HEMATOCRIT: 25.3 % — AB (ref 36.0–46.0)
HEMOGLOBIN: 8.6 g/dL — AB (ref 12.0–15.0)
LYMPHS ABS: 2.6 10*3/uL (ref 0.7–4.0)
LYMPHS PCT: 29 %
MCH: 30.1 pg (ref 26.0–34.0)
MCHC: 34 g/dL (ref 30.0–36.0)
MCV: 88.5 fL (ref 78.0–100.0)
MONOS PCT: 4 %
Monocytes Absolute: 0.4 10*3/uL (ref 0.1–1.0)
NEUTROS PCT: 65 %
Neutro Abs: 5.7 10*3/uL (ref 1.7–7.7)
Platelets: 207 10*3/uL (ref 150–400)
RBC: 2.86 MIL/uL — ABNORMAL LOW (ref 3.87–5.11)
RDW: 14.9 % (ref 11.5–15.5)
WBC: 8.8 10*3/uL (ref 4.0–10.5)

## 2016-07-03 LAB — TYPE AND SCREEN
ABO/RH(D): O POS
Antibody Screen: NEGATIVE

## 2016-07-03 LAB — NO BLOOD PRODUCTS

## 2016-07-03 NOTE — Patient Instructions (Signed)
18 Linda Duffy  07/03/2016   Your procedure is scheduled on:  07/04/2016  [facility] through [facility] at [facility] AM.  Pick up the phone at the desk and dial [phone].   Call this number if you have problems the morning of surgery: [phone]   Remember:   Do not eat food:After Midnight.  Do not drink clear liquids: After Midnight.  Take these medicines the morning of surgery with A SIP OF WATER: none   Do not wear jewelry, make-up or nail polish.  Do not wear lotions, powders, or perfumes. Do not wear deodorant.  Do not shave 48 hours prior to surgery.  Do not bring valuables to the hospital.  [facility] is not   responsible for any belongings or valuables brought to the hospital.  Contacts, dentures or bridgework may not be worn into surgery.  Leave suitcase in the car. After surgery it may be brought to your room.  For patients admitted to the hospital, checkout time is 11:00 AM the day of              discharge.   Patients discharged the day of surgery will not be allowed to drive             home.  Name and phone number of your driver: na  Special Instructions:   N/A   Please read over the following fact sheets that you were given:   Surgical Site Infection Prevention

## 2016-07-04 ENCOUNTER — Encounter (HOSPITAL_COMMUNITY): Payer: Self-pay

## 2016-07-04 ENCOUNTER — Inpatient Hospital Stay (HOSPITAL_COMMUNITY): Payer: Medicaid Other | Admitting: Anesthesiology

## 2016-07-04 ENCOUNTER — Encounter (HOSPITAL_COMMUNITY)
Admission: RE | Disposition: A | Discharge status: Home / Self Care | Payer: Self-pay | Source: Ambulatory Visit | Attending: Obstetrics and Gynecology

## 2016-07-04 ENCOUNTER — Inpatient Hospital Stay (HOSPITAL_COMMUNITY)
Admission: RE | Admit: 2016-07-04 | Discharge: 2016-07-07 | DRG: 766 | Disposition: A | Discharge status: Home / Self Care | Payer: Medicaid Other | Source: Ambulatory Visit | Attending: Obstetrics and Gynecology | Admitting: Obstetrics and Gynecology

## 2016-07-04 DIAGNOSIS — Z833 Family history of diabetes mellitus: Secondary | ICD-10-CM | POA: Diagnosis not present

## 2016-07-04 DIAGNOSIS — D573 Sickle-cell trait: Secondary | ICD-10-CM | POA: Diagnosis present

## 2016-07-04 DIAGNOSIS — O9902 Anemia complicating childbirth: Secondary | ICD-10-CM | POA: Diagnosis present

## 2016-07-04 DIAGNOSIS — Z3A39 39 weeks gestation of pregnancy: Secondary | ICD-10-CM

## 2016-07-04 DIAGNOSIS — Z87891 Personal history of nicotine dependence: Secondary | ICD-10-CM

## 2016-07-04 DIAGNOSIS — Z8249 Family history of ischemic heart disease and other diseases of the circulatory system: Secondary | ICD-10-CM

## 2016-07-04 DIAGNOSIS — O34211 Maternal care for low transverse scar from previous cesarean delivery: Secondary | ICD-10-CM | POA: Diagnosis present

## 2016-07-04 DIAGNOSIS — Z98891 History of uterine scar from previous surgery: Secondary | ICD-10-CM

## 2016-07-04 LAB — RPR: RPR: NONREACTIVE

## 2016-07-04 SURGERY — Surgical Case
Anesthesia: Spinal | Site: Abdomen

## 2016-07-04 MED ORDER — IBUPROFEN 600 MG PO TABS
600.0000 mg | ORAL_TABLET | Freq: Four times a day (QID) | ORAL | Status: DC
  Administered 2016-07-04 – 2016-07-07 (×11): 600 mg via ORAL
  Filled 2016-07-04 (×11): qty 1

## 2016-07-04 MED ORDER — EPHEDRINE SULFATE 50 MG/ML IJ SOLN
INTRAMUSCULAR | Status: DC | PRN
  Administered 2016-07-04: 10 mg via INTRAVENOUS

## 2016-07-04 MED ORDER — DIBUCAINE 1 % RE OINT: 1.0000 "application " | TOPICAL_OINTMENT | RECTAL | Status: DC | PRN

## 2016-07-04 MED ORDER — SIMETHICONE 80 MG PO CHEW
80.0000 mg | CHEWABLE_TABLET | ORAL | Status: DC | PRN
  Administered 2016-07-06: 80 mg via ORAL

## 2016-07-04 MED ORDER — ONDANSETRON HCL 4 MG/2ML IJ SOLN
INTRAMUSCULAR | Status: DC | PRN
  Administered 2016-07-04: 4 mg via INTRAVENOUS

## 2016-07-04 MED ORDER — SIMETHICONE 80 MG PO CHEW
80.0000 mg | CHEWABLE_TABLET | ORAL | Status: DC
  Administered 2016-07-04 – 2016-07-06 (×3): 80 mg via ORAL
  Filled 2016-07-04 (×3): qty 1

## 2016-07-04 MED ORDER — ACETAMINOPHEN 500 MG PO TABS
1000.0000 mg | ORAL_TABLET | Freq: Four times a day (QID) | ORAL | Status: AC
  Administered 2016-07-05 (×2): 1000 mg via ORAL
  Filled 2016-07-04 (×3): qty 2

## 2016-07-04 MED ORDER — ZOLPIDEM TARTRATE 5 MG PO TABS: 5.0000 mg | ORAL_TABLET | Freq: Every evening | ORAL | Status: DC | PRN

## 2016-07-04 MED ORDER — ONDANSETRON HCL 4 MG/2ML IJ SOLN
INTRAMUSCULAR | Status: AC
  Filled 2016-07-04: qty 2

## 2016-07-04 MED ORDER — NALBUPHINE HCL 10 MG/ML IJ SOLN: 5.0000 mg | Freq: Once | INTRAMUSCULAR | Status: DC | PRN

## 2016-07-04 MED ORDER — NALOXONE HCL 0.4 MG/ML IJ SOLN: 0.4000 mg | INTRAMUSCULAR | Status: DC | PRN

## 2016-07-04 MED ORDER — OXYTOCIN 10 UNIT/ML IJ SOLN
INTRAVENOUS | Status: DC | PRN
  Administered 2016-07-04: 40 [IU] via INTRAVENOUS

## 2016-07-04 MED ORDER — WITCH HAZEL-GLYCERIN EX PADS: 1.0000 "application " | MEDICATED_PAD | CUTANEOUS | Status: DC | PRN

## 2016-07-04 MED ORDER — SODIUM CHLORIDE 0.9% FLUSH: 3.0000 mL | INTRAVENOUS | Status: DC | PRN

## 2016-07-04 MED ORDER — FENTANYL CITRATE (PF) 100 MCG/2ML IJ SOLN
INTRAMUSCULAR | Status: AC
  Filled 2016-07-04: qty 2

## 2016-07-04 MED ORDER — DIPHENHYDRAMINE HCL 25 MG PO CAPS
25.0000 mg | ORAL_CAPSULE | Freq: Four times a day (QID) | ORAL | Status: DC | PRN
  Filled 2016-07-04: qty 1

## 2016-07-04 MED ORDER — KETOROLAC TROMETHAMINE 30 MG/ML IJ SOLN
INTRAMUSCULAR | Status: AC
  Filled 2016-07-04: qty 1

## 2016-07-04 MED ORDER — ONDANSETRON HCL 4 MG/2ML IJ SOLN: 4.0000 mg | Freq: Three times a day (TID) | INTRAMUSCULAR | Status: DC | PRN

## 2016-07-04 MED ORDER — LACTATED RINGERS IV SOLN
INTRAVENOUS | Status: DC | PRN
  Administered 2016-07-04: 14:00:00 via INTRAVENOUS

## 2016-07-04 MED ORDER — MORPHINE SULFATE (PF) 0.5 MG/ML IJ SOLN
INTRAMUSCULAR | Status: DC | PRN
  Administered 2016-07-04: .2 mg via INTRATHECAL

## 2016-07-04 MED ORDER — KETOROLAC TROMETHAMINE 30 MG/ML IJ SOLN
30.0000 mg | Freq: Four times a day (QID) | INTRAMUSCULAR | Status: AC | PRN
  Administered 2016-07-04: 30 mg via INTRAMUSCULAR

## 2016-07-04 MED ORDER — OXYTOCIN 10 UNIT/ML IJ SOLN
INTRAMUSCULAR | Status: AC
  Filled 2016-07-04: qty 4

## 2016-07-04 MED ORDER — LACTATED RINGERS IV SOLN
INTRAVENOUS | Status: DC
  Administered 2016-07-04 – 2016-07-05 (×3): via INTRAVENOUS

## 2016-07-04 MED ORDER — NALBUPHINE HCL 10 MG/ML IJ SOLN: 5.0000 mg | INTRAMUSCULAR | Status: DC | PRN

## 2016-07-04 MED ORDER — FENTANYL CITRATE (PF) 100 MCG/2ML IJ SOLN: 25.0000 ug | INTRAMUSCULAR | Status: DC | PRN

## 2016-07-04 MED ORDER — MEPERIDINE HCL 25 MG/ML IJ SOLN: 6.2500 mg | INTRAMUSCULAR | Status: DC | PRN

## 2016-07-04 MED ORDER — KETOROLAC TROMETHAMINE 30 MG/ML IJ SOLN: 30.0000 mg | Freq: Four times a day (QID) | INTRAMUSCULAR | Status: AC | PRN

## 2016-07-04 MED ORDER — MEPERIDINE HCL 25 MG/ML IJ SOLN
INTRAMUSCULAR | Status: AC
  Filled 2016-07-04: qty 1

## 2016-07-04 MED ORDER — IBUPROFEN 600 MG PO TABS: 600.0000 mg | ORAL_TABLET | Freq: Four times a day (QID) | ORAL | Status: DC | PRN

## 2016-07-04 MED ORDER — FENTANYL CITRATE (PF) 100 MCG/2ML IJ SOLN
INTRAMUSCULAR | Status: DC | PRN
  Administered 2016-07-04: 20 ug via INTRATHECAL

## 2016-07-04 MED ORDER — COCONUT OIL OIL: 1.0000 "application " | TOPICAL_OIL | Status: DC | PRN

## 2016-07-04 MED ORDER — SIMETHICONE 80 MG PO CHEW
80.0000 mg | CHEWABLE_TABLET | Freq: Three times a day (TID) | ORAL | Status: DC
  Administered 2016-07-04 – 2016-07-07 (×8): 80 mg via ORAL
  Filled 2016-07-04 (×9): qty 1

## 2016-07-04 MED ORDER — MEPERIDINE HCL 25 MG/ML IJ SOLN
INTRAMUSCULAR | Status: DC | PRN
  Administered 2016-07-04: 12.5 mg via INTRAVENOUS

## 2016-07-04 MED ORDER — OXYCODONE HCL 5 MG PO TABS
5.0000 mg | ORAL_TABLET | ORAL | Status: DC | PRN
Start: 2016-07-04 — End: 2016-07-07
  Administered 2016-07-05 – 2016-07-07 (×7): 5 mg via ORAL
  Filled 2016-07-04 (×5): qty 1

## 2016-07-04 MED ORDER — SCOPOLAMINE 1 MG/3DAYS TD PT72
MEDICATED_PATCH | TRANSDERMAL | Status: AC
  Filled 2016-07-04: qty 1

## 2016-07-04 MED ORDER — PHENYLEPHRINE 8 MG IN D5W 100 ML (0.08MG/ML) PREMIX OPTIME
INJECTION | INTRAVENOUS | Status: AC
  Filled 2016-07-04: qty 100

## 2016-07-04 MED ORDER — OXYTOCIN 40 UNITS IN LACTATED RINGERS INFUSION - SIMPLE MED: 2.5000 [IU]/h | INTRAVENOUS | Status: AC

## 2016-07-04 MED ORDER — MORPHINE SULFATE-NACL 0.5-0.9 MG/ML-% IV SOSY
PREFILLED_SYRINGE | INTRAVENOUS | Status: AC
  Filled 2016-07-04: qty 1

## 2016-07-04 MED ORDER — PHENYLEPHRINE HCL 10 MG/ML IJ SOLN
INTRAMUSCULAR | Status: DC | PRN
  Administered 2016-07-04: 40 ug via INTRAVENOUS
  Administered 2016-07-04: 80 ug via INTRAVENOUS

## 2016-07-04 MED ORDER — SODIUM CHLORIDE 0.9 % IR SOLN
Status: DC | PRN
  Administered 2016-07-04: 1

## 2016-07-04 MED ORDER — EPHEDRINE 5 MG/ML INJ
INTRAVENOUS | Status: AC
  Filled 2016-07-04: qty 10

## 2016-07-04 MED ORDER — ACETAMINOPHEN 325 MG PO TABS
650.0000 mg | ORAL_TABLET | ORAL | Status: DC | PRN
  Administered 2016-07-04 – 2016-07-07 (×6): 650 mg via ORAL
  Filled 2016-07-04 (×6): qty 2

## 2016-07-04 MED ORDER — TETANUS-DIPHTH-ACELL PERTUSSIS 5-2.5-18.5 LF-MCG/0.5 IM SUSP: 0.5000 mL | Freq: Once | INTRAMUSCULAR | Status: AC

## 2016-07-04 MED ORDER — SCOPOLAMINE 1 MG/3DAYS TD PT72
1.0000 | MEDICATED_PATCH | Freq: Once | TRANSDERMAL | Status: DC
  Administered 2016-07-04: 1.5 mg via TRANSDERMAL

## 2016-07-04 MED ORDER — OXYCODONE HCL 5 MG PO TABS
10.0000 mg | ORAL_TABLET | ORAL | Status: DC | PRN
Start: 2016-07-04 — End: 2016-07-07
  Administered 2016-07-05 – 2016-07-06 (×2): 10 mg via ORAL
  Filled 2016-07-04 (×4): qty 2

## 2016-07-04 MED ORDER — DIPHENHYDRAMINE HCL 50 MG/ML IJ SOLN
12.5000 mg | INTRAMUSCULAR | Status: DC | PRN
Start: 2016-07-04 — End: 2016-07-07

## 2016-07-04 MED ORDER — NALOXONE HCL 2 MG/2ML IJ SOSY: 1.0000 ug/kg/h | PREFILLED_SYRINGE | INTRAVENOUS | Status: DC | PRN

## 2016-07-04 MED ORDER — CEFAZOLIN SODIUM-DEXTROSE 2-4 GM/100ML-% IV SOLN
2.0000 g | INTRAVENOUS | Status: AC
  Administered 2016-07-04: 2 g via INTRAVENOUS

## 2016-07-04 MED ORDER — MENTHOL 3 MG MT LOZG: 1.0000 | LOZENGE | OROMUCOSAL | Status: DC | PRN

## 2016-07-04 MED ORDER — SENNOSIDES-DOCUSATE SODIUM 8.6-50 MG PO TABS
2.0000 | ORAL_TABLET | ORAL | Status: DC
  Administered 2016-07-04 – 2016-07-06 (×3): 2 via ORAL
  Filled 2016-07-04 (×3): qty 2

## 2016-07-04 MED ORDER — DIPHENHYDRAMINE HCL 25 MG PO CAPS: 25.0000 mg | ORAL_CAPSULE | ORAL | Status: DC | PRN

## 2016-07-04 MED ORDER — BUPIVACAINE IN DEXTROSE 0.75-8.25 % IT SOLN
INTRATHECAL | Status: DC | PRN
  Administered 2016-07-04: 1.7 mL via INTRATHECAL

## 2016-07-04 MED ORDER — PRENATAL MULTIVITAMIN CH
1.0000 | ORAL_TABLET | Freq: Every day | ORAL | Status: DC
  Administered 2016-07-05 – 2016-07-07 (×3): 1 via ORAL
  Filled 2016-07-04 (×3): qty 1

## 2016-07-04 MED ORDER — PHENYLEPHRINE 40 MCG/ML (10ML) SYRINGE FOR IV PUSH (FOR BLOOD PRESSURE SUPPORT)
PREFILLED_SYRINGE | INTRAVENOUS | Status: AC
  Filled 2016-07-04: qty 20

## 2016-07-04 MED ORDER — LACTATED RINGERS IV SOLN
INTRAVENOUS | Status: DC
  Administered 2016-07-04 (×3): via INTRAVENOUS

## 2016-07-04 MED ORDER — PHENYLEPHRINE 8 MG IN D5W 100 ML (0.08MG/ML) PREMIX OPTIME
INJECTION | INTRAVENOUS | Status: DC | PRN
  Administered 2016-07-04: 60 ug/min via INTRAVENOUS

## 2016-07-04 SURGICAL SUPPLY — 34 items
APL SKNCLS STERI-STRIP NONHPOA (GAUZE/BANDAGES/DRESSINGS) ×1
BENZOIN TINCTURE PRP APPL 2/3 (GAUZE/BANDAGES/DRESSINGS) ×2 IMPLANT
BRR ADH 6X5 SEPRAFILM 1 SHT (MISCELLANEOUS)
CHLORAPREP W/TINT 26ML (MISCELLANEOUS) ×3 IMPLANT
CLAMP CORD UMBIL (MISCELLANEOUS) IMPLANT
CLOSURE STERI STRIP 1/2 X4 (GAUZE/BANDAGES/DRESSINGS) ×2 IMPLANT
CONTAINER PREFILL 10% NBF 15ML (MISCELLANEOUS) IMPLANT
DRSG OPSITE POSTOP 4X10 (GAUZE/BANDAGES/DRESSINGS) ×3 IMPLANT
ELECT REM PT RETURN 9FT ADLT (ELECTROSURGICAL) ×3
ELECTRODE REM PT RTRN 9FT ADLT (ELECTROSURGICAL) ×1 IMPLANT
EXTRACTOR VACUUM M CUP 4 TUBE (SUCTIONS) IMPLANT
EXTRACTOR VACUUM M CUP 4' TUBE (SUCTIONS)
GLOVE BIOGEL PI IND STRL 6.5 (GLOVE) ×1 IMPLANT
GLOVE BIOGEL PI IND STRL 7.0 (GLOVE) ×1 IMPLANT
GLOVE BIOGEL PI INDICATOR 6.5 (GLOVE) ×2
GLOVE BIOGEL PI INDICATOR 7.0 (GLOVE) ×2
GLOVE SURG SS PI 6.0 STRL IVOR (GLOVE) ×3 IMPLANT
GOWN STRL REUS W/TWL LRG LVL3 (GOWN DISPOSABLE) ×6 IMPLANT
KIT ABG SYR 3ML LUER SLIP (SYRINGE) IMPLANT
NDL HYPO 25X5/8 SAFETYGLIDE (NEEDLE) IMPLANT
NEEDLE HYPO 25X5/8 SAFETYGLIDE (NEEDLE) IMPLANT
NS IRRIG 1000ML POUR BTL (IV SOLUTION) ×3 IMPLANT
PACK C SECTION WH (CUSTOM PROCEDURE TRAY) ×3 IMPLANT
PAD ABD 7.5X8 STRL (GAUZE/BANDAGES/DRESSINGS) ×4 IMPLANT
PAD OB MATERNITY 4.3X12.25 (PERSONAL CARE ITEMS) ×3 IMPLANT
PENCIL SMOKE EVAC W/HOLSTER (ELECTROSURGICAL) ×3 IMPLANT
RTRCTR C-SECT PINK 25CM LRG (MISCELLANEOUS) IMPLANT
SEPRAFILM MEMBRANE 5X6 (MISCELLANEOUS) IMPLANT
SPONGE DRAIN TRACH 4X4 STRL 2S (GAUZE/BANDAGES/DRESSINGS) ×4 IMPLANT
SUT PLAIN 0 NONE (SUTURE) IMPLANT
SUT VIC AB 0 CT1 36 (SUTURE) ×12 IMPLANT
SUT VIC AB 4-0 KS 27 (SUTURE) ×3 IMPLANT
TOWEL OR 17X24 6PK STRL BLUE (TOWEL DISPOSABLE) ×3 IMPLANT
TRAY FOLEY CATH SILVER 14FR (SET/KITS/TRAYS/PACK) ×3 IMPLANT

## 2016-07-04 NOTE — Anesthesia Postprocedure Evaluation (Signed)
Anesthesia Post Note  Patient: Linda Duffy  Procedure(s) Performed: Procedure(s) (LRB): CESAREAN SECTION (N/A)  Patient location during evaluation: PACU Anesthesia Type: Spinal Level of consciousness: oriented and awake and alert Pain management: pain level controlled Vital Signs Assessment: post-procedure vital signs reviewed and stable Respiratory status: spontaneous breathing, respiratory function stable and nonlabored ventilation Cardiovascular status: blood pressure returned to baseline and stable Postop Assessment: no headache, no backache, patient able to bend at knees, spinal receding and no signs of nausea or vomiting Anesthetic complications: no     Last Vitals:  Vitals:   07/04/16 1544 07/04/16 1545  BP:    Pulse: 62 67  Resp: 17 20  Temp:      Last Pain:  Vitals:   07/04/16 1530  TempSrc: Oral   Pain Goal: Patients Stated Pain Goal: 3 (07/04/16 1117)               Jaaziah Schulke A.

## 2016-07-04 NOTE — H&P (Signed)
Obstetric Preoperative History and Physical  Linda Duffy is a 30 y.o. [id number] with IUP at 2026w2d presenting for scheduled cesarean section.  No acute concerns.   P[facility]  with onset of care at 34 weeks Pregnancy complications or risks: Patient Active Problem List   Diagnosis Date Noted  . 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 Preterm contractions 06/11/2016  . Marijuana abuse 06/09/2016  . Anemia in pregnancy 06/09/2016  . Late prenatal care 06/02/2016  . Short interval between pregnancies affecting pregnancy, antepartum 06/02/2016  . S/P cesarean section 05/24/2015  . Supervision of normal pregnancy 04/11/2011   She plans to breastfeed She desires undecided for postpartum contraception.   Prenatal labs and studies: ABO, Rh: --/--/O POS (11/09 1330) Antibody: NEG (11/09 1330) Rubella: 1.67 (10/09 1006) RPR: Non Reactive (11/09 1330)  HBsAg: NEGATIVE (10/09 1006)  HIV: NONREACTIVE (10/09 1006)  1 hr Glucola  normal Genetic screening not collected Anatomy US normal  Prenatal Transfer Tool  Maternal Diabetes: No Genetic Screening: Declined Maternal Ultrasounds/Referrals: Normal Fetal Ultrasounds or other Referrals:  None Maternal Substance Abuse:  No Significant Maternal Medications:  None Significant Maternal Lab Results: None  Past Medical History:  Diagnosis Date  . Abnormal Pap smear    had cryo after 1st c-section and nl after that  . Anemia   . 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 Ovarian cyst 2012    Past Surgical History:  Procedure Laterality Date  . CERVIX LESION DESTRUCTION     after 1st c-section and nl paps after that  . CESAREAN SECTION     x1  . CESAREAN SECTION N/A 05/24/2015   Procedure: REPEAT CESAREAN SECTION;  Surgeon: Kathreen CosierBernard A Marshall, MD;  Location: [facility];  Service: Obstetrics;  Laterality: N/A;  . DILATION AND CURETTAGE OF UTERUS     x2 for EABs    OB History  Gravida Para Term Preterm AB  Living  5 2 2   2 2   SAB TAB Ectopic Multiple Live Births    2   0 2    # Outcome Date GA Lbr Len/2nd Weight Sex Delivery Anes PTL Lv  5 Current           4 Term 05/24/15 6769w0d  5 lb 14 oz (2.665 kg) F CS-Vac Spinal  LIV     Birth Comments: Normal exam.  3 Term 07/04/03     CS-LTranv None N LIV  2 TAB           1 TAB               Social History   Social History  . Marital status: Single    Spouse name: N/A  . Number of children: N/A  . Years of education: N/A   Social History Main Topics  . Smoking status: Former Smoker    Packs/day: 0.25    Types: Cigarettes    Quit date: 09/18/2014  . Smokeless tobacco: Never Used  . Alcohol use No  . Drug use: No  . Sexual activity: Yes    Birth control/ protection: None   Other Topics Concern  . None   Social History Narrative  . None    Family History  Problem Relation Age of Onset  . Diabetes Mother   . Hypertension Mother   . Heart disease Mother     Prescriptions Prior to Admission  Medication Sig Dispense Refill Last Dose  . acidophilus (RISAQUAD) CAPS capsule Take 1  capsule by mouth daily.   07/03/2016 at unknown  . ferrous sulfate (FERROUSUL) 325 (65 FE) MG tablet Take 1 tablet (325 mg total) by mouth 2 (two) times daily. 60 tablet 1 Past Week at unknown  . Prenatal Vit-Fe Fumarate-FA (PRENATAL MULTIVITAMIN) TABS tablet Take 1 tablet by mouth daily at 12 noon. 30 tablet 6 07/03/2016 at unknown    No Known Allergies  Review of Systems: Negative except for what is mentioned in HPI.  Physical Exam: BP 132/81   Pulse (!) 101   Temp 98.2 F (36.8 C) (Oral)   Resp 16   LMP 10/03/2015 (Approximate)   SpO2 100%  FHR by Doppler: 151 bpm CONSTITUTIONAL: Well-developed, well-nourished female in no acute distress.  HENT:  Normocephalic, atraumatic, External right and left ear normal. Oropharynx is clear and moist EYES: Conjunctivae and EOM are normal. Pupils are equal, round, and reactive to light. No scleral  icterus.  NECK: Normal range of motion, supple, no masses SKIN: Skin is warm and dry. No rash noted. Not diaphoretic. No erythema. No pallor. NEUROLGIC: Alert and oriented to person, place, and time. Normal reflexes, muscle tone coordination. No cranial nerve deficit noted. PSYCHIATRIC: Normal mood and affect. Normal behavior. Normal judgment and thought content. CARDIOVASCULAR: Normal heart rate noted, regular rhythm RESPIRATORY: Effort and breath sounds normal, no problems with respiration noted ABDOMEN: Soft, nontender, nondistended, gravid. Well-healed Pfannenstiel incision. PELVIC: Deferred MUSCULOSKELETAL: Normal range of motion. No edema and no tenderness. 2+ distal pulses.   Pertinent Labs/Studies:   Results for orders placed or performed during the hospital encounter of 07/03/16 (from the past 72 hour(s))  Type and screen     Status: None   Collection Time: 07/03/16  1:30 PM  Result Value Ref Range   ABO/RH(D) O POS    Antibody Screen NEG    Sample Expiration 07/06/2016   CBC with Differential/Platelet     Status: Abnormal   Collection Time: 07/03/16  1:30 PM  Result Value Ref Range   WBC 8.8 4.0 - 10.5 K/uL   RBC 2.86 (L) 3.87 - 5.11 MIL/uL   Hemoglobin 8.6 (L) 12.0 - 15.0 g/dL   HCT 16.125.3 (L) 09.636.0 - 04.546.0 %   MCV 88.5 78.0 - 100.0 fL   MCH 30.1 26.0 - 34.0 pg   MCHC 34.0 30.0 - 36.0 g/dL   RDW 40.914.9 81.111.5 - 91.415.5 %   Platelets 207 150 - 400 K/uL   Neutrophils Relative % 65 %   Neutro Abs 5.7 1.7 - 7.7 K/uL   Lymphocytes Relative 29 %   Lymphs Abs 2.6 0.7 - 4.0 K/uL   Monocytes Relative 4 %   Monocytes Absolute 0.4 0.1 - 1.0 K/uL   Eosinophils Relative 1 %   Eosinophils Absolute 0.1 0.0 - 0.7 K/uL   Basophils Relative 1 %   Basophils Absolute 0.1 0.0 - 0.1 K/uL  RPR     Status: None   Collection Time: 07/03/16  1:30 PM  Result Value Ref Range   RPR Ser Ql Non Reactive Non Reactive    Comment: (NOTE) Performed At: [facility] [address] [place] [id number] Mila HomerHancock William F MD YQ:[id number]:[phone]   No blood products     Status: None   Collection Time: 07/03/16  1:30 PM  Result Value Ref Range   Transfuse no blood products      TRANSFUSE NO BLOOD PRODUCTS, VERIFIED BY AMELIA LANDIN,RNC    Assessment and Plan :Linda Duffy is a 30 y.o.  [id number] at [redacted]w[redacted]d being admitted for scheduled cesarean section. The risks of cesarean section discussed with the patient included but were not limited to: bleeding which may require transfusion or reoperation; infection which may require antibiotics; injury to bowel, bladder, ureters or other surrounding organs; injury to the fetus; need for additional procedures including hysterectomy in the event of a life-threatening hemorrhage; placental abnormalities wth subsequent pregnancies, incisional problems, thromboembolic phenomenon and other postoperative/anesthesia complications. The patient concurred with the proposed plan, giving informed written consent for the procedure. Patient has been NPO since last night she will remain NPO for procedure. Anesthesia and OR aware. Preoperative prophylactic antibiotics and SCDs ordered on call to the OR. To OR when ready.   Ernestina Penna MD OB Fellow Faculty Practice, [facility]

## 2016-07-04 NOTE — Addendum Note (Signed)
Addendum  created 07/04/16 2010 by Collier FlowersElizabeth J May Ozment, CRNA   Sign clinical note

## 2016-07-04 NOTE — Lactation Note (Signed)
This note was copied from a baby's chart. Lactation Consultation Note  Patient Name: Linda Duffy Czechiara Bocock QMVHQ'IToday's Date: 07/04/2016 Reason for consult: Initial assessment Mom reports baby BF for about 10 minutes in the OR. Mom wanted to latch baby again at this visit but baby was too sleepy. Left baby STS on Mom. Mom is experienced BF. Advised to BF with feeding ques. Lactation brochure left for review, advised of OP services and support group. Encouraged to call for assist as needed.   Maternal Data Has patient been taught Hand Expression?: Yes Does the patient have breastfeeding experience prior to this delivery?: Yes  Feeding Feeding Type: Breast Fed Length of feed: 0 min  LATCH Score/Interventions Latch: Too sleepy or reluctant, no latch achieved, no sucking elicited.     Type of Nipple: Everted at rest and after stimulation  Comfort (Breast/Nipple): Soft / non-tender           Lactation Tools Discussed/Used     Consult Status Consult Status: Follow-up Date: 07/05/16 Follow-up type: In-patient    Alfred LevinsGranger, Dynasia Kercheval Ann 07/04/2016, 3:05 PM

## 2016-07-04 NOTE — Anesthesia Postprocedure Evaluation (Signed)
Anesthesia Post Note  Patient: Linda Duffy  Procedure(s) Performed: Procedure(s) (LRB): CESAREAN SECTION (N/A)  Patient location during evaluation: Mother Baby Anesthesia Type: Spinal Level of consciousness: awake and alert, oriented and patient cooperative Pain management: pain level controlled Vital Signs Assessment: post-procedure vital signs reviewed and stable Respiratory status: spontaneous breathing, nonlabored ventilation and respiratory function stable Cardiovascular status: stable Postop Assessment: no headache, no backache, patient able to bend at knees, no signs of nausea or vomiting and adequate PO intake Anesthetic complications: no     Last Vitals:  Vitals:   07/04/16 1700 07/04/16 1800  BP: 116/61 113/65  Pulse: 64 62  Resp: 18 18  Temp: 36.6 C 36.6 C    Last Pain: 3 Vitals:   07/04/16 1800  TempSrc: Oral   Pain Goal: 4               Deatra Mcmahen

## 2016-07-04 NOTE — Anesthesia Procedure Notes (Signed)
Spinal  Patient location during procedure: OR Start time: 07/04/2016 1:36 PM Staffing Anesthesiologist: Mal AmabileFOSTER, Reis Pienta Performed: anesthesiologist  Preanesthetic Checklist Completed: patient identified, site marked, surgical consent, pre-op evaluation, timeout performed, IV checked, risks and benefits discussed and monitors and equipment checked Spinal Block Patient position: sitting Prep: site prepped and draped and DuraPrep Patient monitoring: heart rate, cardiac monitor, continuous pulse ox and blood pressure Approach: midline Location: L3-4 Injection technique: single-shot Needle Needle type: Sprotte  Needle gauge: 24 G Needle length: 9 cm Needle insertion depth: 4.5 cm Assessment Sensory level: T4 Additional Notes Patient tolerated procedure well. Adequate sensory level.

## 2016-07-04 NOTE — Transfer of Care (Signed)
Immediate Anesthesia Transfer of Care Note  Patient: Linda Duffy  Procedure(s) Performed: Procedure(s): CESAREAN SECTION (N/A)  Patient Location: PACU  Anesthesia Type:Spinal  Level of Consciousness: awake, alert  and oriented  Airway & Oxygen Therapy: Patient Spontanous Breathing  Post-op Assessment: Report given to RN and Post -op Vital signs reviewed and stable  Post vital signs: Reviewed and stable  Last Vitals:  Vitals:   07/04/16 1117  BP: 132/81  Pulse: (!) 101  Resp: 16  Temp: 36.8 C    Last Pain:  Vitals:   07/04/16 1117  TempSrc: Oral      Patients Stated Pain Goal: 3 (07/04/16 1117)  Complications: No apparent anesthesia complications

## 2016-07-04 NOTE — Op Note (Signed)
[name] PROCEDURE DATE: 07/04/2016  PREOPERATIVE DIAGNOSIS: Intrauterine pregnancy at  4322w2d weeks gestation; patient declines vag del attempt and previous uterine incision low transverse x2  POSTOPERATIVE DIAGNOSIS: The same  PROCEDURE: Primary/Repeat Low Transverse Cesarean Section  SURGEON:  Dr. Eber Jonesarolyn L. Harraway-Smith                       Dr. Ernestina PennaNicholas Bellah Alia  ASSISTANT:  None  Complications: none immediate  EBL: 600cc  INDICATIONS: Linda Duffy is a 30 y.o. [id number] at 4122w2d here for cesarean section secondary to the indications listed under preoperative diagnosis; please see preoperative note for further details.  The risks of cesarean section were discussed with the patient including but were not limited to: bleeding which may require transfusion or reoperation; infection which may require antibiotics; injury to bowel, bladder, ureters or other surrounding organs; injury to the fetus; need for additional procedures including hysterectomy in the event of a life-threatening hemorrhage; placental abnormalities wth subsequent pregnancies, incisional problems, thromboembolic phenomenon and other postoperative/anesthesia complications.   The patient concurred with the proposed plan, giving informed written consent for the procedure.    FINDINGS:  Viable female infant in cephalic presentation.  Apgars 9 and 9.  Clear amniotic fluid.  Intact placenta, three vessel cord.  Normal uterus, fallopian tubes and ovaries bilaterally.  PROCEDURE IN DETAIL:  The patient preoperatively received intravenous antibiotics and had sequential compression devices applied to her lower extremities.  She was then taken to the operating room where spinal anesthesia was administered and was found to be adequate. She was then placed in a dorsal supine position with a leftward tilt, and prepped and draped in a sterile manner.  A foley catheter was placed into her bladder and attached to constant gravity.  After an  adequate timeout was performed, a Pfannenstiel skin incision was made with scalpel and carried through to the underlying layer of fascia. The fascia was incised in the midline, and this incision was extended bilaterally using the Mayo scissors.  Kocher clamps were applied to the superior aspect of the fascial incision and the underlying rectus muscles were dissected off bluntly and with cautery. A similar process was carried out on the inferior aspect of the fascial incision. The rectus muscles were separated in the midline bluntly and the peritoneum was entered sharply.  A bladder flap was created. Attention was turned to the lower uterine segment where a low transverse hysterotomy incision was made with a scalpel and extended bilaterally bluntly. The rectus muscle was incised on the left side to allow for successfully delivery of the infant, the cord was clamped and cut and the infant was handed over to awaiting neonatology team. The placenta was delivered manually. Uterine massage was then administered.  The placenta was intact with a three-vessel cord. The uterus was then cleared of clot and debris.  The hysterotomy was closed with 0 Vicryl in a running locked fashion in a single layer. The uterus was returned to the pelvis. The pelvis was cleared of all clot and debris. Hemostasis was confirmed on all surfaces.  The peritoneum and the muscles were reapproximated using 0 Vicryl with 1 interrupted suture. The fascia was then closed using 0 Vicryl.  The subcutaneous layer was irrigated, then reapproximated with 3-0 vicryl in a running locked fashion, and the skin was closed with a 4-0 Vicryl subcuticular stitch.  Benzoin and steristrips were applied.  The patient tolerated the procedure well. Sponge, lap, instrument and needle counts  were correct x 2.  She was taken to the recovery room in stable condition.   Ernestina PennaNicholas Jakyia Gaccione MD

## 2016-07-05 LAB — CBC
HCT: 21.7 % — ABNORMAL LOW (ref 36.0–46.0)
Hemoglobin: 7.5 g/dL — ABNORMAL LOW (ref 12.0–15.0)
MCH: 30.4 pg (ref 26.0–34.0)
MCHC: 34.6 g/dL (ref 30.0–36.0)
MCV: 87.9 fL (ref 78.0–100.0)
PLATELETS: 179 10*3/uL (ref 150–400)
RBC: 2.47 MIL/uL — ABNORMAL LOW (ref 3.87–5.11)
RDW: 15.2 % (ref 11.5–15.5)
WBC: 9.2 10*3/uL (ref 4.0–10.5)

## 2016-07-05 MED ORDER — FERROUS SULFATE 325 (65 FE) MG PO TABS
325.0000 mg | ORAL_TABLET | Freq: Every day | ORAL | Status: DC
  Administered 2016-07-05 – 2016-07-07 (×3): 325 mg via ORAL
  Filled 2016-07-05 (×3): qty 1

## 2016-07-05 NOTE — Lactation Note (Signed)
This note was copied from a baby's chart. Lactation Consultation Note  Patient Name: Linda Duffy [id number]'BToday's Date: 07/05/2016 Reason for consult: Follow-up assessment   Follow up with mom of 32 hour old infant. Infant with 9 BF for 10-60 minutes, 5 voids and 8 stool in last 24 hours. LATCH Scores 9-10. Infant weight 7 lb 0.7 oz with weight loss of 2% since birth.  Maternal history of +THC. Infant UDS negative. Mom reports 8513 month old weaned herself from BF at 89 yo.  Mom reports BF is going well. She denies nipple pain or tenderness. She has no questions/concerns at this time. Follow up tomorrow and prn.    Maternal Data Formula Feeding for Exclusion: No Has patient been taught Hand Expression?: Yes Does the patient have breastfeeding experience prior to this delivery?: Yes  Feeding Feeding Type: Breast Fed  LATCH Score/Interventions                      Lactation Tools Discussed/Used     Consult Status Consult Status: Follow-up Date: 07/06/16 Follow-up type: In-patient    Silas FloodSharon S Bexleigh Theriault 07/05/2016, 10:14 PM

## 2016-07-05 NOTE — Clinical Social Work Maternal (Signed)
  CLINICAL SOCIAL WORK MATERNAL/CHILD NOTE  Patient Details  Name: Linda Duffy MRN: [id number] Date of Birth: 1986/01/28  Date:  07/05/2016  Clinical Social Worker Initiating Note:  [name] Terriyah Westra, MSW, LCSW-A  Date/ Time Initiated:  07/05/16/1221     Child's Name:  Linda Duffy.    Legal Guardian:  Other (Comment) (Not established by court system; MOB and FOB parent collectively)   Need for Interpreter:  None   Date of Referral:  07/04/16     Reason for Referral:  Late or No Prenatal Care    Referral Source:  Physician   Address:  [place], [place] [address]  Phone number:  [phone]   Household Members:  Self, Significant Other, Minor Children   Natural Supports (not living in the home):  Immediate Family, Friends   Professional Supports: None   Employment:     Type of Work:     Education:      Pensions consultant:  [employer]   Other Resources:      Cultural/Religious Considerations Which May Impact Care:  None reported at this time.   Strengths:  Ability to meet basic needs , Compliance with medical plan , Pediatrician chosen  (Dr. Truddie Coco- Apt on 07/08/2016)   Risk Factors/Current Problems:  None   Cognitive State:  Alert , Able to Concentrate , Insightful , Goal Oriented    Mood/Affect:  Relaxed , Happy    CSW Assessment: CSW met with MOB at bedside to complete assessment. At the time of this writers arrival, MOB was in bed holding baby. MOB was very happy and inviting of this writers arrival. This Probation officer explained role and reasoning for visit being due to her [facility]. This Probation officer informed MOB that due to her [facility], a UDS and cord blood test have been taken of baby per protocol. This Probation officer noted to MOB that baby's UDS is negative and cord blood test is still pending. MOB verbalized understanding noting that her RN informed her of the testing. MOB notes she was unaware she was pregnant until she present to the ER due to feeling  weak as if she was going to pass out. MOB notes it was in the ER she was informed of her pregnancy and she was already 34+ weeks. This Probation officer assessed MOB's emotions regarding surprise delivery. MOB notes it was a surprise but a great one and they are all excited.   At this time, no other needs were addressed or requested. CSW will continue to follow pending cord blood test results.   CSW Plan/Description:  Other (Comment) (CSW will contine to follow pending cord blood test results )   [name] Derin Granquist, MSW, [facility]  Office: [phone]

## 2016-07-05 NOTE — Progress Notes (Signed)
POSTPARTUM PROGRESS NOTE  Post Op Day 1 Subjective:  Linda Duffy is a 30 y.o. [id number] 6577w3d s/p rLTCS.  No acute events overnight.  Pt denies problems with ambulating, voiding or po intake.  She denies nausea or vomiting.  Pain is well controlled.  She has had flatus. She has not had bowel movement.  Lochia Small.   Objective: Blood pressure 105/61, pulse 64, temperature 98.4 F (36.9 C), temperature source Oral, resp. rate 18, last menstrual period 10/03/2015, SpO2 100 %, currently breastfeeding.  Physical Exam:  General: alert, cooperative and no distress Lochia:normal flow Heart: RRR with intact distal pulses Abdomen: +BS, soft, nontender,  Uterine Fundus: firm, below the level of the umbilicus DVT Evaluation: No calf swelling or tenderness   Recent Labs  07/03/16 1330 07/05/16 0509  HGB 8.6* 7.5*  HCT 25.3* 21.7*    Assessment/Plan:  ASSESSMENT: Linda Czechiara Record is a 30 y.o. [id number] 7677w3d s/p rLTCS Doing well postoperatively Continue current care Ambulate patient Plan for outpatient Circ Started Ferrous Sulfate with Vitamin C Trial of voiding today   LOS: 1 day   Deniece ReeJosue D Santos, MD 07/05/2016, 7:55 AM   I have seen and examined this patient and agree the above assessment.  Respiratory effort normal, lochia appropriate, legs negative,  pain level normal.  CRESENZO-DISHMAN,Irlanda Croghan 07/05/2016 8:18 AM

## 2016-07-06 MED ORDER — IBUPROFEN 600 MG PO TABS: 600.0000 mg | ORAL_TABLET | Freq: Four times a day (QID) | ORAL | 0 refills | Status: DC

## 2016-07-06 MED ORDER — OXYCODONE HCL 5 MG PO TABS: 5.0000 mg | ORAL_TABLET | ORAL | 0 refills | Status: DC | PRN

## 2016-07-06 MED ORDER — DOCUSATE SODIUM 100 MG PO CAPS: 100.0000 mg | ORAL_CAPSULE | Freq: Two times a day (BID) | ORAL | 0 refills | Status: DC

## 2016-07-06 NOTE — Lactation Note (Signed)
This note was copied from a baby's chart. Lactation Consultation Note  Patient Name: Linda Duffy YNWGN'FToday's Date: 07/06/2016 Reason for consult: Follow-up assessment;Infant weight loss   Follow up with Exp BF mom of 49 hour old infant. Infant was resting on mom's chest. Mom reports infant is cluster feeding. She reports she is feeling fuller today and denies nipple tenderness.   Infant with 18 BF for 10-60 minutes, 3 voids and 4 stools in the 24 hours preceding this assessment. Infant weight 6 lb 11.2 oz with 7% weight loss since birth. LATCH scores 8-10.  Mom with out questions/concerns, follow up tomorrow and prn.     Maternal Data Does the patient have breastfeeding experience prior to this delivery?: Yes  Feeding Feeding Type: Breast Fed Length of feed: 5 min  LATCH Score/Interventions                      Lactation Tools Discussed/Used     Consult Status Consult Status: Follow-up Date: 07/07/16 Follow-up type: In-patient    Silas FloodSharon S Darel Ricketts 07/06/2016, 3:31 PM

## 2016-07-06 NOTE — Discharge Instructions (Signed)
Cesarean Delivery, Care After °Refer to this sheet in the next few weeks. These instructions provide you with information on caring for yourself after your procedure. Your health care provider may also give you specific instructions. Your treatment has been planned according to current medical practices, but problems sometimes occur. Call your health care provider if you have any problems or questions after you go home. °HOME CARE INSTRUCTIONS  °· Only take over-the-counter or prescription medications as directed by your health care provider. °· Do not drink alcohol, especially if you are breastfeeding or taking medication to relieve pain. °· Do not chew or smoke tobacco. °· Continue to use good perineal care. Good perineal care includes: °¨ Wiping your perineum from front to back. °¨ Keeping your perineum clean. °· Check your surgical cut (incision) daily for increased redness, drainage, swelling, or separation of skin. °· Clean your incision gently with soap and water every day, and then pat it dry. If your health care provider says it is okay, leave the incision uncovered. Use a bandage (dressing) if the incision is draining fluid or appears irritated. If the adhesive strips across the incision do not fall off within 7 days, carefully peel them off. °· Hug a pillow when coughing or sneezing until your incision is healed. This helps to relieve pain. °· Do not use tampons or douche until your health care provider says it is okay. °· Shower, wash your hair, and take tub baths as directed by your health care provider. °· Wear a well-fitting bra that provides breast support. °· Limit wearing support panties or control-top hose. °· Drink enough fluids to keep your urine clear or pale yellow. °· Eat high-fiber foods such as whole grain cereals and breads, brown rice, beans, and fresh fruits and vegetables every day. These foods may help prevent or relieve constipation. °· Resume activities such as climbing stairs,  driving, lifting, exercising, or traveling as directed by your health care provider. °· Talk to your health care provider about resuming sexual activities. This is dependent upon your risk of infection, your rate of healing, and your comfort and desire to resume sexual activity. °· Try to have someone help you with your household activities and your newborn for at least a few days after you leave the hospital. °· Rest as much as possible. Try to rest or take a nap when your newborn is sleeping. °· Increase your activities gradually. °· Keep all of your scheduled postpartum appointments. It is very important to keep your scheduled follow-up appointments. At these appointments, your health care provider will be checking to make sure that you are healing physically and emotionally. °SEEK MEDICAL CARE IF:  °· You are passing large clots from your vagina. Save any clots to show your health care provider. °· You have a foul smelling discharge from your vagina. °· You have trouble urinating. °· You are urinating frequently. °· You have pain when you urinate. °· You have a change in your bowel movements. °· You have increasing redness, pain, or swelling near your incision. °· You have pus draining from your incision. °· Your incision is separating. °· You have painful, hard, or reddened breasts. °· You have a severe headache. °· You have blurred vision or see spots. °· You feel sad or depressed. °· You have thoughts of hurting yourself or your newborn. °· You have questions about your care, the care of your newborn, or medications. °· You are dizzy or light-headed. °· You have a rash. °· You   have pain, redness, or swelling at the site of the removed intravenous access (IV) tube.  You have nausea or vomiting.  You stopped breastfeeding and have not had a menstrual period within 12 weeks of stopping.  You are not breastfeeding and have not had a menstrual period within 12 weeks of delivery.  You have a fever. SEEK  IMMEDIATE MEDICAL CARE IF:  You have persistent pain.  You have chest pain.  You have shortness of breath.  You faint.  You have leg pain.  You have stomach pain.  Your vaginal bleeding saturates 2 or more sanitary pads in 1 hour. MAKE SURE YOU:   Understand these instructions.  Will watch your condition.  Will get help right away if you are not doing well or get worse.   This information is not intended to replace advice given to you by your health care provider. Make sure you discuss any questions you have with your health care provider.   Document Released: 05/03/2002 Document Revised: 09/01/2014 Document Reviewed: 04/07/2012 Elsevier Interactive Patient Education 2016 [employer].  Storing Breast Milk Breast milk is a living fluid that contains infection-fighting cells (antibodies). Pre-pumped (expressed) breast milk needs to be stored in a certain way so that it remains effective in protecting your baby against infections. The following guidelines are for storing breast milk for a healthy, full-term infant.  HOW LONG CAN BREAST MILK BE STORED?  Milk can be stored for up to 4 hours at room temperature, or 60-56F (15.6-19.4C). However, it is acceptable to allow the milk to sit for 6-8 hours if the pump parts and containers are well cleaned.  Milk can be stored for 3 days in a refrigerator at less than 259F (3.9C). However, it is acceptable to allow the milk to sit for up to 8 days if the pump parts and containers are well cleaned.  Milk can be stored for 2 weeks in a freezer compartment inside a refrigerator.  Milk can be stored for 3-4 months in a freezer unit with a separate door.  Milk can be stored for 6-12 months in a deep freezer at -59F (-20C). A deep freezer is a chest or stand-alone freezer that is not opened very often and stays at a colder temperature. HOW SHOULD I STORE BREAST MILK?  Milk may be stored in a:  Glass container.  Hard plastic  container.  Plastic bag specially designed for storing milk. Many women like these because they take up less space and can be attached directly to the breast pump.  Store your milk in 2-4 oz (60-120 mL) servings. This makes it easier to thaw the milk. It also helps you avoid having to throw out milk your baby does not drink.  Leave an inch or so at the top of the bag or bottle so the milk has room to expand as it freezes.  Label each container with the date and time the milk was pumped so that you use the milk in the order it was pumped.  If you will be freezing the milk, store it in the back of the freezer. This prevents the milk from being affected by temperature changes due to the freezer door being opened. THAWING FROZEN BREAST MILK  Frozen milk can be thawed:  In a refrigerator.  Under warm-running tap water.  In a pan of warm water that has been heated on the stove.  Do not heat milk directly on the stove or in a microwave as this will  destroy some of its infection-fighting properties. °· Thawed milk can be stored in the refrigerator for up to 24 hours but should not be refrozen. °· If you want to add freshly pumped milk to the frozen milk, make sure to add less milk than what is already frozen. Chill the fresh milk in your refrigerator for 30 minutes before adding it to the milk in the freezer. °  °This information is not intended to replace advice given to you by your health care provider. Make sure you discuss any questions you have with your health care provider. °  °Document Released: 06/08/2009 Document Revised: 08/16/2013 Document Reviewed: 05/23/2013 °Elsevier Interactive Patient Education ©2016 Elsevier Inc. ° °
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
## 2016-07-06 NOTE — Progress Notes (Signed)
Patient ID: Linda Duffy, female   DOB: 08-08-1986, 30 y.o.   MRN: [id number]   POSTPARTUM PROGRESS NOTE  Post Op Day #2 Subjective:  Linda Czechiara Abruzzo is a 30 y.o. [id number] 3225w4d s/p RLTCS.  No acute events overnight.  Pt denies problems with ambulating, voiding or po intake.  She denies nausea or vomiting.  Pain is well controlled.  She has had flatus, but only one episode. She has not had bowel movement.  Lochia Minimal.   Objective: Blood pressure 116/79, pulse 68, temperature 98.2 F (36.8 C), temperature source Oral, resp. rate 18, height 5\' 7"  (1.702 m), weight 144 lb (65.3 kg), last menstrual period 10/03/2015, SpO2 100 %, currently breastfeeding.  Physical Exam:  General: alert, cooperative and no distress Lochia:normal flow Chest: CTAB Heart: RRR no m/r/g Abdomen: +BS x4, soft, nontender, incision is clean, dry, intact with honeycomb dressing in place and saturated about 50% with old blood. Uterine Fundus: firm, at umbilicus DVT Evaluation: No calf swelling or tenderness Extremities: No edema   Recent Labs  07/03/16 1330 07/05/16 0509  HGB 8.6* 7.5*  HCT 25.3* 21.7*    Assessment/Plan:  ASSESSMENT: Linda Czechiara Bonfanti is a 30 y.o. [id number] 6025w4d s/p RLTCS.  Plan for discharge tomorrow, Breastfeeding, Lactation consult and Contraception Depo Provera. Outpatient circumcision   LOS: 2 days   Jen MowElizabeth Kindred Heying, DO OB Fellow [facility], [facility]  07/06/2016, 9:01 AM
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
## 2016-07-06 NOTE — Discharge Summary (Deleted)
OB Discharge Summary     Patient Name: Linda Duffy DOB: 08/10/1986 MRN: [id number]  Date of admission: 07/04/2016 Delivering MD: Catalina AntiguaONSTANT, PEGGY   Date of discharge: 07/06/2016  Admitting diagnosis: cpt 4098159514 - REPEAT c-section Intrauterine pregnancy: 2182w4d     Secondary diagnosis:  Active Problems:   S/P cesarean section  Additional problems: None     Discharge diagnosis: Term Pregnancy Delivered                                                                                                Post partum procedures:None  Augmentation: none  Complications: None  Hospital course:  Sceduled C/S   30 y.o. yo [id number] at 3082w4d was admitted to the hospital 07/04/2016 for scheduled cesarean section with the following indication:Elective Repeat.  Membrane Rupture Time/Date: 1:57 PM ,07/04/2016   Patient delivered a Viable infant.07/04/2016  Details of operation can be found in separate operative note.  Pateint had an uncomplicated postpartum course.  She is ambulating, tolerating a regular diet, passing flatus, and urinating well. Patient is discharged home in stable condition on  07/06/16          Physical exam Vitals:   07/05/16 1013 07/05/16 1330 07/05/16 1817 07/06/16 0508  BP: 110/70 126/71 122/69 116/79  Pulse: 73 67 84 68  Resp: 18 18 16 18   Temp: 97.7 F (36.5 C) 98.4 F (36.9 C) 98 F (36.7 C) 98.2 F (36.8 C)  TempSrc: Oral Oral Oral Oral  SpO2: 100%  100%   Weight:      Height:       General: alert, cooperative and no distress Lochia: appropriate Uterine Fundus: firm Incision: Healing well with no significant drainage, No significant erythema, Dressing is clean, dry, and intact DVT Evaluation: No evidence of DVT seen on physical exam. Negative Homan's sign. No cords or calf tenderness. Labs: Lab Results  Component Value Date   WBC 9.2 07/05/2016   HGB 7.5 (L) 07/05/2016   HCT 21.7 (L) 07/05/2016   MCV 87.9 07/05/2016   PLT 179 07/05/2016   CMP  Latest Ref Rng & Units 06/06/2015  Glucose 65 - 99 mg/dL 88  BUN 6 - 20 mg/dL 8  Creatinine 9.560.44 - 2.131.00 mg/dL 0.860.68  Sodium 578135 - 469145 mmol/L 137  Potassium 3.5 - 5.1 mmol/L 3.7  Chloride 101 - 111 mmol/L 112(H)  CO2 22 - 32 mmol/L 22  Calcium 8.9 - 10.3 mg/dL 6.2(X8.5(L)  Total Protein 6.5 - 8.1 g/dL 7.0  Total Bilirubin 0.3 - 1.2 mg/dL 0.8  Alkaline Phos 38 - 126 U/L 83  AST 15 - 41 U/L 15  ALT 14 - 54 U/L 9(L)    Discharge instruction: per After Visit Summary and "Baby and Me Booklet".  After visit meds:    Medication List    TAKE these medications   acidophilus Caps capsule Take 1 capsule by mouth daily.   docusate sodium 100 MG capsule Commonly known as:  COLACE Take 1 capsule (100 mg total) by mouth 2 (two) times daily.   ferrous sulfate 325 (65 FE) MG tablet Commonly known  asVernice Jefferson:  FERROUSUL Take 1 tablet (325 mg total) by mouth 2 (two) times daily.   ibuprofen 600 MG tablet Commonly known as:  ADVIL,MOTRIN Take 1 tablet (600 mg total) by mouth every 6 (six) hours.   oxyCODONE 5 MG immediate release tablet Commonly known as:  Oxy IR/ROXICODONE Take 1 tablet (5 mg total) by mouth every 4 (four) hours as needed for severe pain or breakthrough pain.   prenatal multivitamin Tabs tablet Take 1 tablet by mouth daily at 12 noon.       Diet: routine diet  Activity: Advance as tolerated. Pelvic rest for 6 weeks.   Outpatient follow up:6 weeks Follow up Appt:Future Appointments Date Time Provider Department Center  08/14/2016 1:20 PM Randall Bingharlie Pickens, MD [facility]   Follow up Visit:No Follow-up on file.  Postpartum contraception: Depo Provera  Newborn Data: Live born female  Birth Weight: 7 lb 3 oz (3260 g) APGAR: 9, 9  Baby Feeding: Breast Disposition:home with mother   07/06/2016 Jen MowElizabeth Billy Turvey, DO OB Fellow

## 2016-07-07 ENCOUNTER — Encounter (HOSPITAL_COMMUNITY): Payer: Self-pay | Admitting: *Deleted

## 2016-07-07 MED ORDER — OXYCODONE HCL 5 MG PO TABS: 5.0000 mg | ORAL_TABLET | ORAL | 0 refills | Status: DC | PRN

## 2016-07-07 NOTE — Progress Notes (Signed)
UR chart review completed.  

## 2016-07-07 NOTE — Discharge Summary (Signed)
OB Discharge Summary  Patient Name: Linda Duffy DOB: 23-Aug-1986 MRN: [id number]  Date of admission: 07/04/2016 Delivering MD: Catalina AntiguaONSTANT, PEGGY   Date of discharge: 07/07/2016  Admitting diagnosis: cpt [id number] - REPEAT c-section Intrauterine pregnancy: 2468w5d     Secondary diagnosis:Active Problems:   S/P cesarean section  Additional problems:anemia     Discharge diagnosis: Term Pregnancy Delivered                                                                     Post partum procedures:none  Augmentation: n/a  Complications: None  Hospital course:  Sceduled C/S   30 y.o. yo [id number] at 5668w5d was admitted to the hospital 07/04/2016 for scheduled cesarean section with the following indication:Elective Repeat.  Membrane Rupture Time/Date: 1:57 PM ,07/04/2016   Patient delivered a Viable infant.07/04/2016  Details of operation can be found in separate operative note.  Pateint had an uncomplicated postpartum course.  She is ambulating, tolerating a regular diet, passing flatus, and urinating well. Patient is discharged home in stable condition on  07/07/16          Physical exam Vitals:   07/05/16 1817 07/06/16 0508 07/06/16 1900 07/07/16 0641  BP: 122/69 116/79 113/66 128/80  Pulse: 84 68 82 67  Resp: 16 18 20 17   Temp: 98 F (36.7 C) 98.2 F (36.8 C) 98.6 F (37 C) 98.1 F (36.7 C)  TempSrc: Oral Oral Oral Oral  SpO2: 100%  100%   Weight:      Height:       General: alert, cooperative and no distress Lochia: appropriate Uterine Fundus: firm Incision: Healing well with no significant drainage, No significant erythema DVT Evaluation: No evidence of DVT seen on physical exam. Labs: Lab Results  Component Value Date   WBC 9.2 07/05/2016   HGB 7.5 (L) 07/05/2016   HCT 21.7 (L) 07/05/2016   MCV 87.9 07/05/2016   PLT 179 07/05/2016   CMP Latest Ref Rng & Units 06/06/2015  Glucose 65 - 99 mg/dL 88  BUN 6 - 20 mg/dL 8  Creatinine 9.560.44 - 2.131.00 mg/dL 0.860.68   Sodium 578135 - 469145 mmol/L 137  Potassium 3.5 - 5.1 mmol/L 3.7  Chloride 101 - 111 mmol/L 112(H)  CO2 22 - 32 mmol/L 22  Calcium 8.9 - 10.3 mg/dL 6.2(X8.5(L)  Total Protein 6.5 - 8.1 g/dL 7.0  Total Bilirubin 0.3 - 1.2 mg/dL 0.8  Alkaline Phos 38 - 126 U/L 83  AST 15 - 41 U/L 15  ALT 14 - 54 U/L 9(L)    Discharge instruction: per After Visit Summary and "Baby and Me Booklet".  After Visit Meds:    Medication List    TAKE these medications   acidophilus Caps capsule Take 1 capsule by mouth daily.   docusate sodium 100 MG capsule Commonly known as:  COLACE Take 1 capsule (100 mg total) by mouth 2 (two) times daily.   ferrous sulfate 325 (65 FE) MG tablet Commonly known as:  FERROUSUL Take 1 tablet (325 mg total) by mouth 2 (two) times daily.   ibuprofen 600 MG tablet Commonly known as:  ADVIL,MOTRIN Take 1 tablet (600 mg total) by mouth every 6 (six) hours.   oxyCODONE 5 MG  immediate release tablet Commonly known as:  Oxy IR/ROXICODONE Take 1 tablet (5 mg total) by mouth every 4 (four) hours as needed for severe pain or breakthrough pain.   prenatal multivitamin Tabs tablet Take 1 tablet by mouth daily at 12 noon.       Diet: routine diet  Activity: Advance as tolerated. Pelvic rest for 6 weeks.   Outpatient follow up:6 weeks Follow up Appt:Future Appointments Date Time Provider Department Center  08/14/2016 1:20 PM Langdon Bingharlie Pickens, MD [facility]   Follow up visit: No Follow-up on file.  Postpartum contraception: Undecided  Newborn Data: Live born female  Birth Weight: 7 lb 3 oz (3260 g) APGAR: 9, 9  Baby Feeding: Breast Disposition:home with mother   07/07/2016 Wyvonnia DuskyMarie Lawson, CNM

## 2016-07-07 NOTE — Lactation Note (Signed)
This note was copied from a baby's chart. Lactation Consultation Note  Patient Name: Linda Duffy RUEAV'WToday's Date: 07/07/2016   Visited with Mom on day of discharge.  Baby sleeping on Mom's chest.  Mom states BFing is going well and baby cluster fed during the night.  8 BFs, 3 stools, and 3 voids last 24 hrs.  Encouraged continued STS and cue based feedings.  Goal is 8-12 feedings in 24 hrs.  Engorgement prevention and treatment discussed.  Reminded Mom of OP lactation services available. To call prn.  [name], [name] 07/07/2016, 10:18 AM

## 2016-07-28 ENCOUNTER — Encounter (HOSPITAL_COMMUNITY): Payer: Self-pay | Admitting: *Deleted

## 2016-08-14 ENCOUNTER — Ambulatory Visit (INDEPENDENT_AMBULATORY_CARE_PROVIDER_SITE_OTHER): Payer: Medicaid Other | Admitting: Obstetrics and Gynecology

## 2016-08-14 ENCOUNTER — Ambulatory Visit (INDEPENDENT_AMBULATORY_CARE_PROVIDER_SITE_OTHER): Payer: Self-pay | Admitting: Clinical

## 2016-08-14 ENCOUNTER — Encounter: Payer: Self-pay | Admitting: Obstetrics and Gynecology

## 2016-08-14 DIAGNOSIS — O99013 Anemia complicating pregnancy, third trimester: Secondary | ICD-10-CM

## 2016-08-14 DIAGNOSIS — Z3042 Encounter for surveillance of injectable contraceptive: Secondary | ICD-10-CM | POA: Diagnosis not present

## 2016-08-14 DIAGNOSIS — D649 Anemia, unspecified: Secondary | ICD-10-CM

## 2016-08-14 DIAGNOSIS — F4322 Adjustment disorder with anxiety: Secondary | ICD-10-CM

## 2016-08-14 MED ORDER — MEDROXYPROGESTERONE ACETATE 150 MG/ML IM SUSP
150.0000 mg | Freq: Once | INTRAMUSCULAR | Status: AC
  Administered 2016-08-14: 150 mg via INTRAMUSCULAR

## 2016-08-14 NOTE — BH Specialist Note (Signed)
Session Start time: 2:20   End Time: 2:45 Total Time:  25 minutes Type of Service: Behavioral Health - Individual/Family Interpreter: No.   Interpreter Name & LanguageGretta Cool: n[facility]a [facility]   SUBJECTIVE: Linda Duffy is a 30 y.o. female brought in by Self.  Pt./Family was referred by Dr Vergie LivingPickens for:  anxiety. Pt./Family reports the following symptoms/concerns: Pt states that she feels somewhat stressed, and easily irritated, taking care of infant, 24mo and 13yo; recognizes a need to find moments to herself throughout the day to cope. Duration of problem:  Over two months Severity: mild Previous treatment: none  OBJECTIVE: Mood: Appropriate & Affect: Appropriate Risk of harm to self or others: No known risk of harm to self or others Assessments administered: PHQ9: 8/ GAD7: 11  LIFE CONTEXT:  Family & Social: Lives with boyfriend, infant son, 24mo daughter, 13yo daughter; family is supportive Product/process development scientistchool/ Work: unknown Self-Care: Less sleep than usual Life changes: Recent childbirth What is important to pt/family (values): Overall wellbeing, family   GOALS ADDRESSED:  -Reduce symptoms of anxiety  INTERVENTIONS: Strength-based and Meditation: CALM relaxation breathing exercise   ASSESSMENT:  Pt/Family currently experiencing Adjustment disorder with anxious mood.  Pt/Family may benefit from psychoeducation and brief therapeutic intervention regarding coping with symptoms of anxiety.      PLAN: 1. F/U with behavioral health clinician: As needed, if symptoms increase or do not improve 2. Behavioral recommendations:  -Practice daily CALM relaxation breathing exercise, with daily multivitamins, and as needed throughout the day -Read educational material regarding coping with symptoms of anxiety -Consider calming apps, as additional self-care strategy 3. Referral: Brief Counseling/Psychotherapy and Psychoeducation 4. From scale of 1-10, how likely are you to follow  plan: 8   Gaynell FaceJamie C Feather Berrie LCSWA Behavioral Health Clinician  Warmhandoff:   Warm Hand Off Completed.       [facility] screen [facility] 08/14/2016 06/02/2016  Decreased Interest 1 0  Down, Depressed, Hopeless 2 1  PHQ - 2 Score 3 1  Altered sleeping 3 2  Tired, decreased energy 2 2  Change in appetite 0 1  Feeling bad or failure about yourself  0 1  Trouble concentrating 0 0  Moving slowly or fidgety/restless 0 0  Suicidal thoughts 0 0  PHQ-9 Score 8 7   GAD 7 : Generalized Anxiety Score 08/14/2016 06/02/2016  Nervous, Anxious, on Edge 3 3  Control/stop worrying 1 2  Worry too much - different things 1 2  Trouble relaxing 3 2  Restless 1 1  Easily annoyed or irritable 2 1  Afraid - awful might happen 0 2  Total GAD 7 Score 11 13

## 2016-08-14 NOTE — Progress Notes (Signed)
error 

## 2016-08-14 NOTE — Progress Notes (Signed)
Obstetrics Visit Postpartum Visit  Appointment Date: 08/14/2016  OB[facility]  Primary Care Provider: No PCP Per Patient  Chief Complaint:  Chief Complaint  Patient presents with  . Postpartum Care    History of Present Illness: Linda Duffy Overall is a 30 y.o. African-American [id number] (No LMP recorded.), seen for the above chief complaint. Her past medical history is significant for h/o prior c-sections   She is s/p 11/10 on scheduled rpt c-section; she was discharged to home on POD#3  Vaginal bleeding or discharge: No  Breast or formula feeding: breastfeeding at least q2-3hrs Intercourse: Yes x1. Last night and used a condom. No problems or issues Contraception before discharge: No  PP depression s/s: No  Any bowel or bladder issues: No  Pap smear: no abnormalities (date: 2016)  Review of Systems: as noted in the History of Present Illness.  Medications PNV  Allergies Patient has no known allergies.  Physical Exam:  BP 119/82   Pulse 79   Wt 133 lb 3.2 oz (60.4 kg)   Breastfeeding? Yes   BMI 20.86 kg/m  Body mass index is 20.86 kg/m. General appearance: Well nourished, well developed female in no acute distress.  Respiratory: Normal respiratory effort Abdomen: no masses, hernias; diffusely non tender to palpation, non distended. Well healed LT skin inicision  Laboratory: none  PP Depression Screening:  8, #10: no  Assessment: pt doing well  Plan:  Okay for depo provera today. Pt getting some sleep and some help at home and feels better today since was able to get out of the house some. I encouraged to make sure she has some "me time" and not feel selfish about this at all.  Pt told to not expect a period with her breastfeeding so frequently and now starting depo.   RTC 3538m for depo provera only visit.   Cornelia Copaharlie Elynn Patteson, Jr MD Attending [facility](Faculty Practice)

## 2016-11-12 ENCOUNTER — Ambulatory Visit: Payer: Medicaid Other

## 2016-11-13 ENCOUNTER — Ambulatory Visit: Payer: Medicaid Other | Admitting: Advanced Practice Midwife

## 2016-12-02 ENCOUNTER — Ambulatory Visit: Payer: Medicaid Other | Admitting: Advanced Practice Midwife

## 2016-12-06 ENCOUNTER — Encounter (HOSPITAL_COMMUNITY): Payer: Self-pay

## 2016-12-06 ENCOUNTER — Emergency Department (HOSPITAL_COMMUNITY): Payer: Medicaid Other

## 2016-12-06 ENCOUNTER — Emergency Department (HOSPITAL_COMMUNITY)
Admission: EM | Admit: 2016-12-06 | Discharge: 2016-12-06 | Disposition: A | Discharge status: Home / Self Care | Payer: Medicaid Other | Attending: Emergency Medicine | Admitting: Emergency Medicine

## 2016-12-06 DIAGNOSIS — Z87891 Personal history of nicotine dependence: Secondary | ICD-10-CM | POA: Insufficient documentation

## 2016-12-06 DIAGNOSIS — Y9389 Activity, other specified: Secondary | ICD-10-CM | POA: Insufficient documentation

## 2016-12-06 DIAGNOSIS — X501XXA Overexertion from prolonged static or awkward postures, initial encounter: Secondary | ICD-10-CM | POA: Diagnosis not present

## 2016-12-06 DIAGNOSIS — S8991XA Unspecified injury of right lower leg, initial encounter: Secondary | ICD-10-CM | POA: Diagnosis present

## 2016-12-06 DIAGNOSIS — S82141A Displaced bicondylar fracture of right tibia, initial encounter for closed fracture: Secondary | ICD-10-CM | POA: Diagnosis not present

## 2016-12-06 DIAGNOSIS — Y999 Unspecified external cause status: Secondary | ICD-10-CM | POA: Insufficient documentation

## 2016-12-06 DIAGNOSIS — Y929 Unspecified place or not applicable: Secondary | ICD-10-CM | POA: Insufficient documentation

## 2016-12-06 MED ORDER — IBUPROFEN 600 MG PO TABS: 600.0000 mg | ORAL_TABLET | Freq: Four times a day (QID) | ORAL | 0 refills | Status: DC | PRN

## 2016-12-06 MED ORDER — ACETAMINOPHEN 500 MG PO TABS: 1000.0000 mg | ORAL_TABLET | Freq: Four times a day (QID) | ORAL | 0 refills | Status: DC | PRN

## 2016-12-06 MED ORDER — IBUPROFEN 200 MG PO TABS
600.0000 mg | ORAL_TABLET | Freq: Once | ORAL | Status: AC
  Administered 2016-12-06: 600 mg via ORAL
  Filled 2016-12-06: qty 3

## 2016-12-06 MED ORDER — ACETAMINOPHEN 500 MG PO TABS
1000.0000 mg | ORAL_TABLET | Freq: Once | ORAL | Status: AC
  Administered 2016-12-06: 1000 mg via ORAL
  Filled 2016-12-06: qty 2

## 2016-12-06 NOTE — Discharge Instructions (Signed)
Your knee x-ray showed a lateral tibial fracture. Please wear your knee immobilizer and crutches and avoid placing weight on your right leg. You may take 600 mg of ibuprofen and 1000 mg of Tylenol every 8 hours for pain. Ice your knee and elevate at least 3 times a day.  Please call Dr. Eliberto Ivory office and make an appointment for a reevaluation in one week. He will then give instructions on physical therapy and for additional treatment needed. Do not put weight on your right leg until you are evaluated by Dr. Magnus Ivan.  Tibial fractures put you at risk for developing compartment syndrome. Please monitor your pain. Return to the Emergency Department if you notice severe calf pain, calf cramping or tightness, numbness or tingling to your right foot, or coldness to your right lower extremity.

## 2016-12-06 NOTE — ED Notes (Signed)
Pt taken to XR.  

## 2016-12-06 NOTE — ED Triage Notes (Addendum)
Pt states that her R knee "popped out of place last night." She reports that it is back in place now, but she is experiencing pain and swelling. She states that this has happened before. A&Ox4. Ambulatory, but limping.

## 2016-12-06 NOTE — ED Provider Notes (Signed)
[facility]-EMERGENCY DEPT Provider Note   CSN: [id number] Arrival date & time: 12/06/16  1236   By signing my name below, I, Clarisse Gouge, attest that this documentation has been prepared under the direction and in the presence of Sharen Heck, PA-C.remind. Electronically Signed: Clarisse Gouge, Scribe. 12/06/16. 2:44 PM.   History   Chief Complaint Chief Complaint  Patient presents with  . Knee Pain    R   The history is provided by the patient and medical records. No language interpreter was used.    Linda Duffy is a 31 y.o. female with no pertinent PMHx, transported by a friend to the Emergency Department with concern for sudden R knee pain onset last night s/p twisting it. Pt notes her knee popped when she twisted it last night. Pt reports associated gait change, swelling and numbness that has subsided. She adds h/o 1 episode of less severe, similar symptoms in the L knee. Pt describes 8/10, constant, sharp R knee pain laterally, radiating to calves and toes that is improved with rest, certain positions; worsened with contact, movement, walking and extension of the foot; no other modifying factors noted. No knee trauma or surgeries noted.   Past Medical History:  Diagnosis Date  . Abnormal Pap smear    had cryo after 1st c-section and nl after that  . Anemia   . 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 Ovarian cyst 2012    Patient Active Problem List   Diagnosis Date Noted  . 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 Marijuana abuse 06/09/2016  . Anemia in pregnancy 06/09/2016  . Late prenatal care 06/02/2016  . S/P cesarean section 05/24/2015  . Supervision of normal pregnancy 04/11/2011    Past Surgical History:  Procedure Laterality Date  . CERVIX LESION DESTRUCTION     after 1st c-section and nl paps after that  . CESAREAN SECTION     x1  . CESAREAN SECTION N/A 05/24/2015   Procedure: REPEAT CESAREAN SECTION;  Surgeon: Kathreen Cosier, MD;  Location: [facility]  ORS;  Service: Obstetrics;  Laterality: N/A;  . CESAREAN SECTION N/A 07/04/2016   Procedure: CESAREAN SECTION;  Surgeon: Willodean Rosenthal, MD;  Location: [facility] BIRTHING SUITES;  Service: Obstetrics;  Laterality: N/A;  . DILATION AND CURETTAGE OF UTERUS     x2 for EABs    OB History    Gravida Para Term Preterm AB Living   0 2 3   SAB TAB Ectopic Multiple Live Births   0 2 0 0 3       Home Medications    Prior to Admission medications   Medication Sig Start Date End Date Taking? Authorizing Provider  acidophilus (RISAQUAD) CAPS capsule Take 1 capsule by mouth daily.    Historical Provider, MD  docusate sodium (COLACE) 100 MG capsule Take 1 capsule (100 mg total) by mouth 2 (two) times daily. Patient not taking: Reported on 08/14/2016 07/06/16   [facility] Mumaw, DO  ferrous sulfate (FERROUSUL) 325 (65 FE) MG tablet Take 1 tablet (325 mg total) by mouth 2 (two) times daily. Patient not taking: Reported on 08/14/2016 06/25/16   Tereso Newcomer, MD  ibuprofen (ADVIL,MOTRIN) 600 MG tablet Take 1 tablet (600 mg total) by mouth every 6 (six) hours. Patient not taking: Reported on 08/14/2016 07/06/16   [facility] Mumaw, DO  oxyCODONE (OXY IR/ROXICODONE) 5 MG immediate release tablet Take 1 tablet (5 mg total) by mouth every 4 (four) hours as needed for severe  pain or breakthrough pain. Patient not taking: Reported on 08/14/2016 07/07/16   Donette Larry, CNM  Prenatal Vit-Fe Fumarate-FA (PRENATAL MULTIVITAMIN) TABS tablet Take 1 tablet by mouth daily at 12 noon. 06/11/16   Aviva Signs, CNM    Family History Family History  Problem Relation Age of Onset  . Diabetes Mother   . Hypertension Mother   . Heart disease Mother     Social History Social History  Substance Use Topics  . Smoking status: Former Smoker    Packs/day: 0.25    Types: Cigarettes    Quit date: 09/18/2014  . Smokeless tobacco: Never Used  . Alcohol use No     Allergies     Patient has no known allergies.   Review of Systems Review of Systems  Musculoskeletal: Positive for arthralgias, gait problem and joint swelling.  Skin: Negative for wound.  Neurological: Negative for weakness and numbness.  Hematological: Does not bruise/bleed easily.  All other systems reviewed and are negative.    Physical Exam Updated Vital Signs BP 128/83 (BP Location: Left Arm)   Pulse (!) 101   Temp 99.7 F (37.6 C) (Oral)   Resp 16   Ht  (1.702 m)   Wt 69 lb 2 oz (31.4 kg)   LMP 09/07/2016   SpO2 100%   BMI 10.83 kg/m   Physical Exam  Constitutional: She is oriented to person, place, and time. She appears well-developed and well-nourished.  HENT:  Head: Normocephalic and atraumatic.  Eyes: Conjunctivae are normal. Right eye exhibits no discharge. Left eye exhibits no discharge. No scleral icterus.  Cardiovascular: Normal rate, regular rhythm and normal heart sounds.   2+ and symmetric popliteal, DP and PT pulses bilaterally  Pulmonary/Chest: Effort normal and breath sounds normal.  Abdominal: There is no tenderness.  Musculoskeletal: She exhibits edema and tenderness.  +Lateral and medial knee edema and tenderness, +tenderness of the patellar tendon +tenderness to the quadriceps tendon +valgus laxity +tenderness to the popliteal space +pain with knee extension +decreased extension d/t pain Normal patellar J tracking  No bony tenderness over patella or fibular head or tibial tuberosity.   Negative Lachman's.  Negative posterior drawer test. No crepitus with knee ROM.  Patient able to bear weight in ED (4+ steps)  Neurological: She is alert and oriented to person, place, and time. Coordination normal.  5/5 strength with knee flexion and extension, bilaterally.  5/5 strength with ankle dorsiflexion and plantar flexion, bilaterally.  Sensation to light touch intact in the distribution of the obturator nerve, lateral cutaneous nerve, femoral nerve,  common fibular nerve.  2/4 knee DTR bilaterally.    Foot: sensation to light touch intact in the distribution of the saphenous nerve, medial plantar nerve, lateral plantar nerve, bilaterally.   Skin:  Skin is warm in the right lower extremity Calf is supple  Psychiatric: She has a normal mood and affect. Her behavior is normal. Judgment and thought content normal.  Nursing note and vitals reviewed.    ED Treatments / Results  DIAGNOSTIC STUDIES: Oxygen Saturation is 100% on RA, NL by my interpretation.    COORDINATION OF CARE: 2:15 PM Discussed treatment plan with pt at bedside and pt agreed to plan. Will order imaging, refer to orthopedic specialist and medications. Pt prepared for d/c, advised of symptomatic care at home and return precautions.   Labs (all labs ordered are listed, but only abnormal results are displayed) Labs Reviewed - No data to display  EKG  EKG Interpretation  None       Radiology Dg Knee Complete 4 Views Right  Result Date: 12/06/2016 CLINICAL DATA:  Transient dislocation of the knee yesterday. Persistent pain. EXAM: RIGHT KNEE - COMPLETE 4+ VIEW COMPARISON:  None. FINDINGS: The knee is located. A a lateral tibial plateau fracture is minimally displaced. No additional fractures are present. A moderate-sized joint effusion is present. Soft tissues are otherwise unremarkable. IMPRESSION: 1. Minimally displaced lateral tibial plateau fracture, Schatzker type 1 2. Moderate joint effusion. Electronically Signed   By: Marin Roberts M.D.   On: 12/06/2016 14:09    Procedures Procedures (including critical care time)  Medications Ordered in ED Medications  acetaminophen (TYLENOL) tablet 1,000 mg (1,000 mg Oral Given 12/06/16 1517)  ibuprofen (ADVIL,MOTRIN) tablet 600 mg (600 mg Oral Given 12/06/16 1517)     Initial Impression / Assessment and Plan / ED Course  I have reviewed the triage vital signs and the nursing notes.  Pertinent labs & imaging  results that were available during my care of the patient were reviewed by me and considered in my medical decision making (see chart for details).    31 year old female presents with acute onset right knee pain. Knee x-ray shows lateral plateau fracture minimally displaced. Right lower extremity is neurovascularly intact. No symptoms or signs of compartment syndrome. Patient given Tylenol and ibuprofen, ice in the ED. Contacted orthopedic surgeon who recommended knee immobilizer, crutches, nonweightbearing and follow-up in clinic clinic in 1 week. Discussed x-ray findings with patient and plan to discharge with follow-up with Dr. Magnus Ivan. Patient is agreeable to plan. ED return precautions given.  Patient, ED treatment and discharge plan was discussed with supervising physician who is agreeable with plan.   Final Clinical Impressions(s) / ED Diagnoses   Final diagnoses:  Closed fracture of right tibial plateau, initial encounter    New Prescriptions New Prescriptions   No medications on file   I personally performed the services described in this documentation, which was scribed in my presence. The recorded information has been reviewed and is accurate.   Liberty Handy, PA-C 12/06/16 1527    Rolan Bucco, MD 12/06/16 1540

## 2016-12-15 ENCOUNTER — Ambulatory Visit (HOSPITAL_BASED_OUTPATIENT_CLINIC_OR_DEPARTMENT_OTHER)
Admission: RE | Admit: 2016-12-15 | Discharge: 2016-12-15 | Disposition: A | Discharge status: Home / Self Care | Payer: Medicaid Other | Source: Ambulatory Visit | Attending: Orthopaedic Surgery | Admitting: Orthopaedic Surgery

## 2016-12-15 ENCOUNTER — Ambulatory Visit (INDEPENDENT_AMBULATORY_CARE_PROVIDER_SITE_OTHER): Payer: Medicaid Other | Admitting: Orthopaedic Surgery

## 2016-12-15 ENCOUNTER — Other Ambulatory Visit (INDEPENDENT_AMBULATORY_CARE_PROVIDER_SITE_OTHER): Payer: Self-pay

## 2016-12-15 DIAGNOSIS — W19XXXA Unspecified fall, initial encounter: Secondary | ICD-10-CM | POA: Insufficient documentation

## 2016-12-15 DIAGNOSIS — S82121A Displaced fracture of lateral condyle of right tibia, initial encounter for closed fracture: Secondary | ICD-10-CM | POA: Diagnosis not present

## 2016-12-15 DIAGNOSIS — S82141A Displaced bicondylar fracture of right tibia, initial encounter for closed fracture: Secondary | ICD-10-CM | POA: Insufficient documentation

## 2016-12-15 DIAGNOSIS — M25561 Pain in right knee: Secondary | ICD-10-CM | POA: Insufficient documentation

## 2016-12-15 DIAGNOSIS — M25461 Effusion, right knee: Secondary | ICD-10-CM | POA: Diagnosis not present

## 2016-12-15 NOTE — Progress Notes (Signed)
Office Visit Note   Patient: Linda Duffy           Date of Birth: August 15, 1986           MRN: [id number] Visit Date: 12/15/2016              Requested by: [facility] [address] [place], [place] [address] PCP: [facility]   Assessment & Plan: Visit Diagnoses:  1. Closed fracture of lateral portion of right tibial plateau, initial encounter     Plan: Fortunately for now we can treat this nonoperatively based on the fracture pattern. Maximal or put weight on her knee in a hinged knee brace. We'll see her back in a month and I would like a repeat AP and lateral of her right knee at that visit. She is someone based on the clinical exam findings and the minimal trauma that I may consider an MRI down the road of her knee to evaluate the ligamentous structures. I did give her a prescription for hydrocodone for pain to take as needed counseled her about this. All questions were encouraged and answered.  Follow-Up Instructions: Return in about 4 weeks (around 01/12/2017).   Orders:  No orders of the defined types were placed in this encounter.  No orders of the defined types were placed in this encounter.     Procedures: No procedures performed   Clinical Data: No additional findings.   Subjective: No chief complaint on file. Is a very pleasant 31 year old female referred from emergency room for injury to her right knee. This occurred about 9 days ago. She said her knee just gave out on her and she fell. X-rays in the emergency room showed a nondisplaced far lateral tibial plateau fracture of the right knee and she is placed in a knee immobilizer and given crutches. This is her first follow-up with orthopedic since then. She denies any numbness and tingling in her foot or calf pain. 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  Objective: Vital Signs: There were no vitals taken for this visit.  Physical Exam She is alert or 3 in no acute distress Ortho Exam Examination of her right knee does show a mild to moderate effusion. The patella was hypermobile but her extension mechanism is intact. She hurts over the lateral femoral condyle and lateral tibial plateau. Her Lockman's appears negative. The knee did not open up to varus and valgus stressing. Specialty Comments:  No specialty comments available.  Imaging: No results found. X-rays of her right knee independently reviewed by me show a far lateral nondisplaced tibial plateau fracture was certainly an odd fracture pattern. It may be more of an avulsion with ligamentous injury. There is a mild effusion. The medial joint line appears normal. The patella was in a normal position.  PMFS History: Patient Active Problem List   Diagnosis Date Noted  . 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 Marijuana abuse 06/09/2016  . Anemia in pregnancy 06/09/2016  . Late prenatal care 06/02/2016  . S/P cesarean section 05/24/2015  . Supervision of normal pregnancy 04/11/2011   Past Medical History:  Diagnosis Date  . Abnormal Pap smear    had cryo after 1st c-section and nl after that  . Anemia   . 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 Ovarian cyst 2012  Family History  Problem Relation Age of Onset  . Diabetes Mother   . Hypertension Mother   . Heart disease Mother     Past Surgical History:  Procedure Laterality Date  . CERVIX LESION DESTRUCTION     after 1st c-section and nl paps after that  . CESAREAN SECTION     x1  . CESAREAN SECTION N/A 05/24/2015   Procedure: REPEAT CESAREAN SECTION;  Surgeon: Kathreen Cosier, MD;  Location: [facility] ORS;  Service: Obstetrics;  Laterality: N/A;  . CESAREAN SECTION N/A 07/04/2016   Procedure: CESAREAN SECTION;  Surgeon: Willodean Rosenthal, MD;  Location: [facility] BIRTHING SUITES;  Service:  Obstetrics;  Laterality: N/A;  . DILATION AND CURETTAGE OF UTERUS     x2 for EABs   Social History   Occupational History  . Not on file.   Social History Main Topics  . Smoking status: Former Smoker    Packs/day: 0.25    Types: Cigarettes    Quit date: 09/18/2014  . Smokeless tobacco: Never Used  . Alcohol use No  . Drug use: No  . Sexual activity: Yes    Birth control/ protection: None

## 2017-01-09 ENCOUNTER — Other Ambulatory Visit (INDEPENDENT_AMBULATORY_CARE_PROVIDER_SITE_OTHER): Payer: Self-pay

## 2017-01-09 DIAGNOSIS — M25561 Pain in right knee: Secondary | ICD-10-CM

## 2017-01-12 ENCOUNTER — Ambulatory Visit (HOSPITAL_BASED_OUTPATIENT_CLINIC_OR_DEPARTMENT_OTHER)
Admission: RE | Admit: 2017-01-12 | Discharge: 2017-01-12 | Disposition: A | Discharge status: Home / Self Care | Payer: Medicaid Other | Source: Ambulatory Visit | Attending: Orthopaedic Surgery | Admitting: Orthopaedic Surgery

## 2017-01-12 ENCOUNTER — Ambulatory Visit (INDEPENDENT_AMBULATORY_CARE_PROVIDER_SITE_OTHER): Payer: Medicaid Other | Admitting: Orthopaedic Surgery

## 2017-01-12 DIAGNOSIS — M25561 Pain in right knee: Secondary | ICD-10-CM | POA: Insufficient documentation

## 2017-01-12 DIAGNOSIS — S82121D Displaced fracture of lateral condyle of right tibia, subsequent encounter for closed fracture with routine healing: Secondary | ICD-10-CM

## 2017-01-12 NOTE — Progress Notes (Signed)
The patient is following up after having a closed tibial plateau fracture of her right knee at the lateral portion of the tibial plateau. This is outside the articular surface. She's been in a hinged knee brace now putting weight on the knee. She feels like it wants to "pop out of place" and feels loose but otherwise she is doing well. 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 [place] examination is no effusion of her right knee today. She does have appropriate pain along the lateral joint line but knee feels stable.  2 views of the obtained using can see the fracture line anymore on the far lateral tibial plateau and the knee is well located in good alignment.  Her knee does feel stable ligamentously.  We will continue her in the knee brace for support and protection. I will have her work on quad strengthening exercises. I like see her back in 4 weeks but no x-rays are needed. This will be for repeat exam stress in the ligamentously to determine whether or not she needs an MRI to better evaluate the ligaments. All questions regurg and answered.

## 2017-02-12 ENCOUNTER — Ambulatory Visit (INDEPENDENT_AMBULATORY_CARE_PROVIDER_SITE_OTHER): Payer: Medicaid Other | Admitting: Orthopaedic Surgery

## 2017-05-28 ENCOUNTER — Encounter (HOSPITAL_COMMUNITY): Payer: Self-pay | Admitting: Emergency Medicine

## 2017-05-28 DIAGNOSIS — Z79899 Other long term (current) drug therapy: Secondary | ICD-10-CM | POA: Insufficient documentation

## 2017-05-28 DIAGNOSIS — N10 Acute tubulo-interstitial nephritis: Secondary | ICD-10-CM | POA: Diagnosis not present

## 2017-05-28 DIAGNOSIS — Z87891 Personal history of nicotine dependence: Secondary | ICD-10-CM | POA: Insufficient documentation

## 2017-05-28 DIAGNOSIS — R109 Unspecified abdominal pain: Secondary | ICD-10-CM | POA: Diagnosis present

## 2017-05-28 LAB — URINALYSIS, ROUTINE W REFLEX MICROSCOPIC
BILIRUBIN URINE: NEGATIVE
Glucose, UA: NEGATIVE mg/dL
KETONES UR: 20 mg/dL — AB
Nitrite: POSITIVE — AB
PH: 5 (ref 5.0–8.0)
PROTEIN: 30 mg/dL — AB
Specific Gravity, Urine: 1.015 (ref 1.005–1.030)

## 2017-05-28 NOTE — ED Triage Notes (Signed)
Pt is c/o groin, back, bilateral flank pain, and chills  States the pain is radiating down the back of her legs to her knees  Pt states she decreased urine output, painful urination, and it has an odor

## 2017-05-29 ENCOUNTER — Emergency Department (HOSPITAL_COMMUNITY)
Admission: EM | Admit: 2017-05-29 | Discharge: 2017-05-29 | Disposition: A | Discharge status: Home / Self Care | Payer: Medicaid Other | Attending: Emergency Medicine | Admitting: Emergency Medicine

## 2017-05-29 DIAGNOSIS — N12 Tubulo-interstitial nephritis, not specified as acute or chronic: Secondary | ICD-10-CM

## 2017-05-29 LAB — CBC WITH DIFFERENTIAL/PLATELET
BASOS ABS: 0 10*3/uL (ref 0.0–0.1)
Basophils Relative: 1 %
Eosinophils Absolute: 0 10*3/uL (ref 0.0–0.7)
Eosinophils Relative: 0 %
HEMATOCRIT: 33.6 % — AB (ref 36.0–46.0)
HEMOGLOBIN: 11.5 g/dL — AB (ref 12.0–15.0)
LYMPHS ABS: 1.8 10*3/uL (ref 0.7–4.0)
Lymphocytes Relative: 38 %
MCH: 30.4 pg (ref 26.0–34.0)
MCHC: 34.2 g/dL (ref 30.0–36.0)
MCV: 88.9 fL (ref 78.0–100.0)
Monocytes Absolute: 0.3 10*3/uL (ref 0.1–1.0)
Monocytes Relative: 6 %
Neutro Abs: 2.6 10*3/uL (ref 1.7–7.7)
Neutrophils Relative %: 55 %
Platelets: 220 10*3/uL (ref 150–400)
RBC: 3.78 MIL/uL — AB (ref 3.87–5.11)
RDW: 14 % (ref 11.5–15.5)
WBC: 4.7 10*3/uL (ref 4.0–10.5)

## 2017-05-29 LAB — COMPREHENSIVE METABOLIC PANEL
ALBUMIN: 4.5 g/dL (ref 3.5–5.0)
ALT: 8 U/L — AB (ref 14–54)
AST: 13 U/L — AB (ref 15–41)
Alkaline Phosphatase: 88 U/L (ref 38–126)
Anion gap: 11 (ref 5–15)
BILIRUBIN TOTAL: 1.1 mg/dL (ref 0.3–1.2)
BUN: 8 mg/dL (ref 6–20)
CHLORIDE: 109 mmol/L (ref 101–111)
CO2: 20 mmol/L — ABNORMAL LOW (ref 22–32)
CREATININE: 0.71 mg/dL (ref 0.44–1.00)
Calcium: 9 mg/dL (ref 8.9–10.3)
GFR calc Af Amer: >60 mL/min (ref >60)
GLUCOSE: 95 mg/dL (ref 65–99)
Potassium: 3.5 mmol/L (ref 3.5–5.1)
Sodium: 140 mmol/L (ref 135–145)
Total Protein: 7.8 g/dL (ref 6.5–8.1)

## 2017-05-29 LAB — PREGNANCY, URINE: Preg Test, Ur: NEGATIVE

## 2017-05-29 MED ORDER — ONDANSETRON 4 MG PO TBDP: 4.0000 mg | ORAL_TABLET | Freq: Three times a day (TID) | ORAL | 0 refills | Status: DC | PRN

## 2017-05-29 MED ORDER — ONDANSETRON HCL 4 MG/2ML IJ SOLN
4.0000 mg | Freq: Once | INTRAMUSCULAR | Status: AC
  Administered 2017-05-29: 4 mg via INTRAVENOUS
  Filled 2017-05-29: qty 2

## 2017-05-29 MED ORDER — DEXTROSE 5 % IV SOLN
1.0000 g | Freq: Once | INTRAVENOUS | Status: AC
  Administered 2017-05-29: 1 g via INTRAVENOUS
  Filled 2017-05-29: qty 10

## 2017-05-29 MED ORDER — CEPHALEXIN 500 MG PO CAPS: 500.0000 mg | ORAL_CAPSULE | Freq: Three times a day (TID) | ORAL | 0 refills | Status: AC

## 2017-05-29 MED ORDER — SODIUM CHLORIDE 0.9 % IV BOLUS (SEPSIS)
1000.0000 mL | Freq: Once | INTRAVENOUS | Status: AC
Start: 2017-05-29 — End: 2017-05-29
  Administered 2017-05-29: 1000 mL via INTRAVENOUS

## 2017-05-29 MED ORDER — KETOROLAC TROMETHAMINE 15 MG/ML IJ SOLN
15.0000 mg | Freq: Once | INTRAMUSCULAR | Status: AC
  Administered 2017-05-29: 15 mg via INTRAVENOUS
  Filled 2017-05-29: qty 1

## 2017-05-29 NOTE — ED Provider Notes (Signed)
[facility]-EMERGENCY DEPT Provider Note   CSN: [id number] Arrival date & time: 05/28/17  2049     History   Chief Complaint Chief Complaint  Patient presents with  . Flank Pain    HPI Linda Duffy is a 31 y.o. female.  HPI  31 year old female presents with back pain, abdominal pain, and fever and chills. She's had vomiting yesterday. She states this feels like a prior kidney infection. She's been having foul-smelling odor. About a week or 2 ago she had symptoms of a UTI but took probiotics and it went away. Now is having these symptoms. She is currently having nausea. No cough or shortness of breath. The pain is diffuse and bilateral in her back as well as her lower abdomen. Occasionally the pain radiates down her legs when he gets bad. But no weakness or numbness in her legs.  Past Medical History:  Diagnosis Date  . Abnormal Pap smear    had cryo after 1st c-section and nl after that  . Anemia   . 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 Ovarian cyst 2012    Patient Active Problem List   Diagnosis Date Noted  . 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 Marijuana abuse 06/09/2016  . Anemia in pregnancy 06/09/2016  . Late prenatal care 06/02/2016  . S/P cesarean section 05/24/2015  . Supervision of normal pregnancy 04/11/2011    Past Surgical History:  Procedure Laterality Date  . CERVIX LESION DESTRUCTION     after 1st c-section and nl paps after that  . CESAREAN SECTION     x1  . CESAREAN SECTION N/A 05/24/2015   Procedure: REPEAT CESAREAN SECTION;  Surgeon: Kathreen Cosier, MD;  Location: [facility];  Service: Obstetrics;  Laterality: N/A;  . CESAREAN SECTION N/A 07/04/2016   Procedure: CESAREAN SECTION;  Surgeon: Willodean Rosenthal, MD;  Location: [facility] BIRTHING SUITES;  Service: Obstetrics;  Laterality: N/A;  . DILATION AND CURETTAGE OF UTERUS     x2 for EABs    OB History    Gravida Para Term Preterm AB Living   0 2 3   SAB TAB Ectopic Multiple Live  Births   0 2 0 0 3       Home Medications    Prior to Admission medications   Medication Sig Start Date End Date Taking? Authorizing Provider  acetaminophen (TYLENOL) 500 MG tablet Take 2 tablets (1,000 mg total) by mouth every 6 (six) hours as needed. Patient taking differently: Take 1,000 mg by mouth every 6 (six) hours as needed for moderate pain.  12/06/16  Yes Sharen Heck J, PA-C  acidophilus (RISAQUAD) CAPS capsule Take 1 capsule by mouth daily.   Yes [provider]  ibuprofen (ADVIL,MOTRIN) 600 MG tablet Take 1 tablet (600 mg total) by mouth every 6 (six) hours as needed. Patient taking differently: Take 600 mg by mouth every 6 (six) hours as needed for moderate pain.  12/06/16  Yes Liberty Handy, PA-C  cephALEXin (KEFLEX) 500 MG capsule Take 1 capsule (500 mg total) by mouth 3 (three) times daily. 05/29/17 06/08/17  Pricilla Loveless, MD  docusate sodium (COLACE) 100 MG capsule Take 1 capsule (100 mg total) by mouth 2 (two) times daily. Patient not taking: Reported on 08/14/2016 07/06/16   Michaele Offer, DO  ferrous sulfate (FERROUSUL) 325 (65 FE) MG tablet Take 1 tablet (325 mg total) by mouth 2 (two) times daily. Patient not taking: Reported on 08/14/2016 06/25/16   Jaynie Collins  A, MD  ondansetron (ZOFRAN ODT) 4 MG disintegrating tablet Take 1 tablet (4 mg total) by mouth every 8 (eight) hours as needed for nausea or vomiting. 05/29/17   Pricilla Loveless, MD  oxyCODONE (OXY IR/ROXICODONE) 5 MG immediate release tablet Take 1 tablet (5 mg total) by mouth every 4 (four) hours as needed for severe pain or breakthrough pain. Patient not taking: Reported on 08/14/2016 07/07/16   Donette Larry, CNM  Prenatal Vit-Fe Fumarate-FA (PRENATAL MULTIVITAMIN) TABS tablet Take 1 tablet by mouth daily at 12 noon. Patient not taking: Reported on 05/29/2017 06/11/16   Aviva Signs, CNM    Family History Family History  Problem Relation Age of Onset  . Diabetes  Mother   . Hypertension Mother   . Heart disease Mother     Social History Social History  Substance Use Topics  . Smoking status: Former Smoker    Packs/day: 0.25    Types: Cigarettes    Quit date: 09/18/2014  . Smokeless tobacco: Never Used  . Alcohol use No     Allergies   Patient has no known allergies.   Review of Systems Review of Systems  Constitutional: Positive for chills and fever (subjective).  Respiratory: Negative for cough and shortness of breath.   Gastrointestinal: Positive for abdominal pain, nausea and vomiting.  Genitourinary: Negative for hematuria, menstrual problem and vaginal bleeding.  Musculoskeletal: Positive for back pain.  Neurological: Negative for weakness and numbness.  All other systems reviewed and are negative.    Physical Exam Updated Vital Signs BP (!) 121/107   Pulse 71   Temp 98.2 F (36.8 C) (Oral)   Resp 14   Ht  (1.702 m)   Wt 77.1 kg (170 lb)   LMP 05/22/2017 Comment: Pt states it started late and only lasted 3 days   SpO2 100%   BMI 26.63 kg/m   Physical Exam  Constitutional: She is oriented to person, place, and time. She appears well-developed and well-nourished.  HENT:  Head: Normocephalic and atraumatic.  Right Ear: External ear normal.  Left Ear: External ear normal.  Nose: Nose normal.  Eyes: Right eye exhibits no discharge. Left eye exhibits no discharge.  Cardiovascular: Normal rate, regular rhythm and normal heart sounds.   Pulmonary/Chest: Effort normal and breath sounds normal.  Abdominal: Soft. There is tenderness in the right lower quadrant, suprapubic area and left lower quadrant. There is CVA tenderness (bilateral).  Musculoskeletal:       Lumbar back: She exhibits tenderness.  Diffuse lower back pain, including bilateral CVA  Neurological: She is alert and oriented to person, place, and time.  5/5 strength in BLE. Normal gross sensation  Skin: Skin is warm and dry.  Nursing note and vitals  reviewed.    ED Treatments / Results  Labs (all labs ordered are listed, but only abnormal results are displayed) Labs Reviewed  URINALYSIS, ROUTINE W REFLEX MICROSCOPIC - Abnormal; Notable for the following:       Result Value   APPearance CLOUDY (*)    Hgb urine dipstick MODERATE (*)    Ketones, ur 20 (*)    Protein, ur 30 (*)    Nitrite POSITIVE (*)    Leukocytes, UA LARGE (*)    Bacteria, UA MANY (*)    Squamous Epithelial / LPF 6-30 (*)    All other components within normal limits  COMPREHENSIVE METABOLIC PANEL - Abnormal; Notable for the following:    CO2 20 (*)    AST 13 (*)  ALT 8 (*)    All other components within normal limits  CBC WITH DIFFERENTIAL/PLATELET - Abnormal; Notable for the following:    RBC 3.78 (*)    Hemoglobin 11.5 (*)    HCT 33.6 (*)    All other components within normal limits  URINE CULTURE  PREGNANCY, URINE  POC URINE PREG, ED    EKG  EKG Interpretation None       Radiology No results found.  Procedures Procedures (including critical care time)  Medications Ordered in ED Medications  sodium chloride 0.9 % bolus 1,000 mL (0 mLs Intravenous Stopped 05/29/17 0407)  ondansetron (ZOFRAN) injection 4 mg (4 mg Intravenous Given 05/29/17 0242)  cefTRIAXone (ROCEPHIN) 1 g in dextrose 5 % 50 mL IVPB (0 g Intravenous Stopped 05/29/17 0407)  ketorolac (TORADOL) 15 MG/ML injection 15 mg (15 mg Intravenous Given 05/29/17 0257)     Initial Impression / Assessment and Plan / ED Course  I have reviewed the triage vital signs and the nursing notes.  Pertinent labs & imaging results that were available during my care of the patient were reviewed by me and considered in my medical decision making (see chart for details).     Patient's presentation is consistent with acute pyelonephritis. She is otherwise healthy patient and while she had vomiting yesterday, no current vomiting. Nausea is improved. wbc normal and metabolic panel without  significant abnormalities besides mildly low bicarbonate without acidosis. No vomiting in the ED. She is afebrile and her vital signs are unremarkable. Plan to discharge home with antibiotics. She was given a dose of fluids and IV Rocephin here. No unilateral symptoms to suggest kidney stone. Discussed strict return precautions.  Final Clinical Impressions(s) / ED Diagnoses   Final diagnoses:  Pyelonephritis    New Prescriptions Discharge Medication List as of 05/29/2017  4:02 AM    START taking these medications   Details  cephALEXin (KEFLEX) 500 MG capsule Take 1 capsule (500 mg total) by mouth 3 (three) times daily., Starting Fri 05/29/2017, Until Mon 06/08/2017, Print    ondansetron (ZOFRAN ODT) 4 MG disintegrating tablet Take 1 tablet (4 mg total) by mouth every 8 (eight) hours as needed for nausea or vomiting., Starting Fri 05/29/2017, Print         Pricilla Loveless, MD 05/29/17 [phone]

## 2017-05-31 LAB — URINE CULTURE: Culture: >=100000 — AB

## 2017-06-01 ENCOUNTER — Telehealth: Payer: Self-pay | Admitting: Emergency Medicine

## 2017-06-01 NOTE — Telephone Encounter (Signed)
Post ED Visit - Positive Culture Follow-up  Culture report reviewed by antimicrobial stewardship pharmacist:   Enzo Bi, Pharm.D.  Celedonio Miyamoto, Pharm.D., BCPS AQ-ID  Garvin Fila, Pharm.D., BCPS  Georgina Pillion, [address].D., BCPS  [place], [place].D., BCPS, AAHIVP  Estella Husk, Pharm.D., BCPS, AAHIVP  Lysle Pearl, PharmD, BCPS  Casilda Carls, PharmD, BCPS  Pollyann Samples, PharmD, BCPS  Positive urine culture Treated with cephalexin, organism sensitive to the same and no further patient follow-up is required at this time.  Berle Mull 06/01/2017, 11:51 AM

## 2017-11-25 ENCOUNTER — Ambulatory Visit (INDEPENDENT_AMBULATORY_CARE_PROVIDER_SITE_OTHER): Payer: Medicaid Other | Admitting: General Practice

## 2017-11-25 DIAGNOSIS — Z3A22 22 weeks gestation of pregnancy: Secondary | ICD-10-CM

## 2017-11-25 DIAGNOSIS — Z3201 Encounter for pregnancy test, result positive: Secondary | ICD-10-CM | POA: Diagnosis not present

## 2017-11-25 LAB — POCT PREGNANCY, URINE: Preg Test, Ur: POSITIVE — AB

## 2017-11-25 NOTE — Progress Notes (Signed)
Patient here for UPT today. UPT +. Patient reports first positive home test two months ago. LMP 06/19/17 EDD 03/26/18 5759w5d. Patient denies taking any meds only PNV and probiotics. Recommended to patient we can do prenatal labs today & scheduled ultrasound appt. Scheduled u/s for 4/17 & informed patient. Patient verbalized understanding to all & had no questions

## 2017-11-26 ENCOUNTER — Other Ambulatory Visit (HOSPITAL_COMMUNITY)
Admission: RE | Admit: 2017-11-26 | Discharge: 2017-11-26 | Disposition: A | Discharge status: Home / Self Care | Payer: Medicaid Other | Source: Ambulatory Visit | Attending: Certified Nurse Midwife | Admitting: Certified Nurse Midwife

## 2017-11-26 ENCOUNTER — Ambulatory Visit (INDEPENDENT_AMBULATORY_CARE_PROVIDER_SITE_OTHER): Payer: Medicaid Other | Admitting: Certified Nurse Midwife

## 2017-11-26 ENCOUNTER — Encounter: Payer: Self-pay | Admitting: Certified Nurse Midwife

## 2017-11-26 VITALS — BP 117/65 | HR 99 | Wt 150.3 lb

## 2017-11-26 DIAGNOSIS — D573 Sickle-cell trait: Secondary | ICD-10-CM

## 2017-11-26 DIAGNOSIS — Z98891 History of uterine scar from previous surgery: Secondary | ICD-10-CM

## 2017-11-26 DIAGNOSIS — Z3482 Encounter for supervision of other normal pregnancy, second trimester: Secondary | ICD-10-CM | POA: Diagnosis not present

## 2017-11-26 DIAGNOSIS — B9689 Other specified bacterial agents as the cause of diseases classified elsewhere: Secondary | ICD-10-CM

## 2017-11-26 DIAGNOSIS — O99012 Anemia complicating pregnancy, second trimester: Secondary | ICD-10-CM

## 2017-11-26 DIAGNOSIS — N760 Acute vaginitis: Secondary | ICD-10-CM

## 2017-11-26 LAB — POCT URINALYSIS DIP (DEVICE)
Bilirubin Urine: NEGATIVE
Glucose, UA: NEGATIVE mg/dL
Hgb urine dipstick: NEGATIVE
Ketones, ur: NEGATIVE mg/dL
Nitrite: NEGATIVE
Protein, ur: NEGATIVE mg/dL
Specific Gravity, Urine: 1.015 (ref 1.005–1.030)
Urobilinogen, UA: 0.2 mg/dL (ref 0.0–1.0)
pH: 7 (ref 5.0–8.0)

## 2017-11-26 MED ORDER — NITROFURANTOIN MONOHYD MACRO 100 MG PO CAPS: 100.0000 mg | ORAL_CAPSULE | Freq: Two times a day (BID) | ORAL | 0 refills | Status: DC

## 2017-11-26 NOTE — Patient Instructions (Signed)
Second Trimester of Pregnancy The second trimester is from week 13 through week 28, month 4 through 6. This is often the time in pregnancy that you feel your best. Often times, morning sickness has lessened or quit. You may have more energy, and you may get hungry more often. Your unborn baby (fetus) is growing rapidly. At the end of the sixth month, he or she is about 9 inches long and weighs about 1 pounds. You will likely feel the baby move (quickening) between 18 and 20 weeks of pregnancy. Follow these instructions at home:  Avoid all smoking, herbs, and alcohol. Avoid drugs not approved by your doctor.  Do not use any tobacco products, including cigarettes, chewing tobacco, and electronic cigarettes. If you need help quitting, ask your doctor. You may get counseling or other support to help you quit.  Only take medicine as told by your doctor. Some medicines are safe and some are not during pregnancy.  Exercise only as told by your doctor. Stop exercising if you start having cramps.  Eat regular, healthy meals.  Wear a good support bra if your breasts are tender.  Do not use hot tubs, steam rooms, or saunas.  Wear your seat belt when driving.  Avoid raw meat, uncooked cheese, and liter boxes and soil used by cats.  Take your prenatal vitamins.  Take 1500-2000 milligrams of calcium daily starting at the 20th week of pregnancy until you deliver your baby.  Try taking medicine that helps you poop (stool softener) as needed, and if your doctor approves. Eat more fiber by eating fresh fruit, vegetables, and whole grains. Drink enough fluids to keep your pee (urine) clear or pale yellow.  Take warm water baths (sitz baths) to soothe pain or discomfort caused by hemorrhoids. Use hemorrhoid cream if your doctor approves.  If you have puffy, bulging veins (varicose veins), wear support hose. Raise (elevate) your feet for 15 minutes, 3-4 times a day. Limit salt in your diet.  Avoid heavy  lifting, wear low heals, and sit up straight.  Rest with your legs raised if you have leg cramps or low back pain.  Visit your dentist if you have not gone during your pregnancy. Use a soft toothbrush to brush your teeth. Be gentle when you floss.  You can have sex (intercourse) unless your doctor tells you not to.  Go to your doctor visits. Get help if:  You feel dizzy.  You have mild cramps or pressure in your lower belly (abdomen).  You have a nagging pain in your belly area.  You continue to feel sick to your stomach (nauseous), throw up (vomit), or have watery poop (diarrhea).  You have bad smelling fluid coming from your vagina.  You have pain with peeing (urination). Get help right away if:  You have a fever.  You are leaking fluid from your vagina.  You have spotting or bleeding from your vagina.  You have severe belly cramping or pain.  You lose or gain weight rapidly.  You have trouble catching your breath and have chest pain.  You notice sudden or extreme puffiness (swelling) of your face, hands, ankles, feet, or legs.  You have not felt the baby move in over an hour.  You have severe headaches that do not go away with medicine.  You have vision changes. This information is not intended to replace advice given to you by your health care provider. Make sure you discuss any questions you have with your health care   provider. Document Released: 11/05/2009 Document Revised: 01/17/2016 Document Reviewed: 10/12/2012 Elsevier Interactive Patient Education  2017 Elsevier Inc.  

## 2017-11-26 NOTE — Progress Notes (Signed)
Subjective:   Linda Duffy is a 32 y.o. [id number] at 3390w6d by LMP being seen today for her first obstetrical visit.  Her obstetrical history is significant for late to prenatal care. Patient does intend to breast feed. Pregnancy history fully reviewed.  Patient reports no complaints.  HISTORY: OB History  Gravida Para Term Preterm AB Living  6 3 3  0 2 3  SAB TAB Ectopic Multiple Live Births  0 2 0 0 3    # Outcome Date GA Lbr Len/2nd Weight Sex Delivery Anes PTL Lv  6 Current           5 Term 07/04/16 456w2d  7 lb 3 oz (3.26 kg) M CS-LTranv Spinal  LIV     Name: COLEMAN,JEFFREY JR.     Apgar1: 9  Apgar5: 9  4 Term 05/24/15 7374w0d  5 lb 14 oz (2.665 kg) F CS-Vac Spinal  LIV     Birth Comments: Normal exam.     Name: Brantley FlingLYNCH,GIRL Aislee     Apgar1: 9  Apgar5: 10  3 Term 07/04/03     CS-LTranv None N LIV  2 TAB           1 TAB             Last pap smear was done 2017-per patient and was normal  Past Medical History:  Diagnosis Date  . Abnormal Pap smear    had cryo after 1st c-section and nl after that  . Anemia   . 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 Ovarian cyst 2012   Past Surgical History:  Procedure Laterality Date  . CERVIX LESION DESTRUCTION     after 1st c-section and nl paps after that  . CESAREAN SECTION     x1  . CESAREAN SECTION N/A 05/24/2015   Procedure: REPEAT CESAREAN SECTION;  Surgeon: Kathreen CosierBernard A Marshall, MD;  Location: [facility];  Service: Obstetrics;  Laterality: N/A;  . CESAREAN SECTION N/A 07/04/2016   Procedure: CESAREAN SECTION;  Surgeon: Willodean Rosenthalarolyn Harraway-Smith, MD;  [facility];  Service: Obstetrics;  Laterality: N/A;  . DILATION AND CURETTAGE OF UTERUS     x2 for EABs   Family History  Problem Relation Age of Onset  . Diabetes Mother   . Hypertension Mother   . Heart disease Mother    Social History   Tobacco Use  . Smoking status: Former Smoker    Packs/day: 0.25    Types: Cigarettes    Last attempt to quit:  09/18/2014    Years since quitting: 3.1  . Smokeless tobacco: Never Used  Substance Use Topics  . Alcohol use: No  . Drug use: Yes    Types: Marijuana    Comment: not since she found out she was pregnant   No Known Allergies Current Outpatient Medications on File Prior to Visit  Medication Sig Dispense Refill  . acidophilus (RISAQUAD) CAPS capsule Take 1 capsule by mouth daily.    . Prenatal Vit-Fe Fumarate-FA (PRENATAL MULTIVITAMIN) TABS tablet Take 1 tablet by mouth daily at 12 noon. 30 tablet 6  . prenatal vitamin w/FE, FA (PRENATAL 1 + 1) 27-1 MG TABS tablet Take 1 tablet by mouth daily at 12 noon.    Marland Kitchen. acetaminophen (TYLENOL) 500 MG tablet Take 2 tablets (1,000 mg total) by mouth every 6 (six) hours as needed. (Patient not taking: Reported on 11/26/2017) 30 tablet 0  . docusate sodium (COLACE) 100 MG capsule Take 1 capsule (100 mg  total) by mouth 2 (two) times daily. (Patient not taking: Reported on 08/14/2016) 30 capsule 0  . ferrous sulfate (FERROUSUL) 325 (65 FE) MG tablet Take 1 tablet (325 mg total) by mouth 2 (two) times daily. (Patient not taking: Reported on 08/14/2016) 60 tablet 1  . ibuprofen (ADVIL,MOTRIN) 600 MG tablet Take 1 tablet (600 mg total) by mouth every 6 (six) hours as needed. (Patient taking differently: Take 600 mg by mouth every 6 (six) hours as needed for moderate pain. ) 30 tablet 0  . ondansetron (ZOFRAN ODT) 4 MG disintegrating tablet Take 1 tablet (4 mg total) by mouth every 8 (eight) hours as needed for nausea or vomiting. 10 tablet 0  . oxyCODONE (OXY IR/ROXICODONE) 5 MG immediate release tablet Take 1 tablet (5 mg total) by mouth every 4 (four) hours as needed for severe pain or breakthrough pain. (Patient not taking: Reported on 08/14/2016) 30 tablet 0   No current facility-administered medications on file prior to visit.     Review of Systems Pertinent items noted in HPI and remainder of comprehensive ROS otherwise negative.  Exam   Vitals:    11/26/17 1401  BP: 117/65  Pulse: 99  Weight: 150 lb 4.8 oz (68.2 kg)   Fetal Heart Rate (bpm): 153  Uterus:  Fundal Height: 23 cm  Pelvic Exam: Perineum: no hemorrhoids, normal perineum   Vulva: normal external genitalia, no lesions   Vagina:  normal mucosa, normal discharge   Bony Pelvis: average  System: General: well-developed, well-nourished female in no acute distress   Breast:  normal appearance, no masses or tenderness   Skin: normal coloration and turgor, no rashes   Neurologic: oriented, normal, negative, normal mood   Extremities: normal strength, tone, and muscle mass, ROM of all joints is normal   HEENT PERRLA, extraocular movement intact and sclera clear   Mouth/Teeth mucous membranes moist, pharynx normal without lesions and dental hygiene good   Neck supple and no masses   Cardiovascular: regular rate and rhythm   Respiratory:  no respiratory distress, normal breath sounds   Abdomen: soft, non-tender; gravid appropriate for gestational age      Assessment:   Pregnancy: [id number] Patient Active Problem List   Diagnosis Date Noted  . History of C-section 11/26/2017  . 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 Anemia in pregnancy 06/09/2016  . Late prenatal care 06/02/2016  . Supervision of normal pregnancy 04/11/2011     Plan:  1. Encounter for supervision of other normal pregnancy in second trimester -patient doing well, no complaints  -Lab appointment yesterday- lab results pending  -Anticipatory guidance on upcoming appointments with GTT around 28 weeks.  - POCT urinalysis dip (device) - prenatal vitamin w/FE, FA (PRENATAL 1 + 1) 27-1 MG TABS tablet; Take 1 tablet by mouth daily at 12 noon. - CHL AMB BABYSCRIPTS OPT IN - Cervicovaginal ancillary only  2. History of C-section H/o C-section x3, plans for repeat.   Initial labs drawn yesterday. Continue prenatal vitamins. Genetic Screening discussed, Quad screen and NIPS: declined. Ultrasound  discussed; fetal anatomic survey: requested. Problem list reviewed and updated. The nature of [facility] with multiple MDs and other Advanced Practice Providers was explained to patient; also emphasized that residents, students are part of our team. Routine obstetric precautions reviewed. Return in about 1 month (around 12/24/2017) for ROB.   Sharyon Cable, CNM 11/26/17, 3:52 PM

## 2017-11-26 NOTE — Progress Notes (Signed)
I have reviewed this chart and agree with the RN/CMA assessment and management.    Cardelia Sassano C Chaylee Ehrsam, MD, FACOG Attending Physician, Faculty [facility]  

## 2017-11-27 LAB — CERVICOVAGINAL ANCILLARY ONLY
Bacterial vaginitis: POSITIVE — AB
Candida vaginitis: NEGATIVE
Chlamydia: NEGATIVE
Neisseria Gonorrhea: NEGATIVE
Trichomonas: NEGATIVE

## 2017-12-01 MED ORDER — METRONIDAZOLE 0.75 % VA GEL: 1.0000 | Freq: Every day | VAGINAL | 1 refills | Status: DC

## 2017-12-01 NOTE — Addendum Note (Signed)
Addended by: Sharyon CableOGERS, Shlomie Romig C on: 12/01/2017 08:40 AM   Modules accepted: Orders

## 2017-12-02 LAB — SMN1 COPY NUMBER ANALYSIS (SMA CARRIER SCREENING)

## 2017-12-02 LAB — OBSTETRIC PANEL, INCLUDING HIV
Antibody Screen: NEGATIVE
BASOS ABS: 0 10*3/uL (ref 0.0–0.2)
BASOS: 0 %
EOS (ABSOLUTE): 0 10*3/uL (ref 0.0–0.4)
Eos: 0 %
HEMATOCRIT: 27.3 % — AB (ref 34.0–46.6)
HEP B S AG: NEGATIVE
HIV Screen 4th Generation wRfx: NONREACTIVE
Hemoglobin: 9 g/dL — ABNORMAL LOW (ref 11.1–15.9)
IMMATURE GRANS (ABS): 0 10*3/uL (ref 0.0–0.1)
Immature Granulocytes: 0 %
Lymphocytes Absolute: 2.1 10*3/uL (ref 0.7–3.1)
Lymphs: 21 %
MCH: 31.6 pg (ref 26.6–33.0)
MCHC: 33 g/dL (ref 31.5–35.7)
MCV: 96 fL (ref 79–97)
MONOCYTES: 6 %
Monocytes Absolute: 0.6 10*3/uL (ref 0.1–0.9)
Neutrophils Absolute: 7.4 10*3/uL — ABNORMAL HIGH (ref 1.4–7.0)
Neutrophils: 73 %
PLATELETS: 201 10*3/uL (ref 150–379)
RBC: 2.85 x10E6/uL — ABNORMAL LOW (ref 3.77–5.28)
RDW: 14.4 % (ref 12.3–15.4)
RPR: NONREACTIVE
RUBELLA: 1.5 {index} (ref >0.99)
Rh Factor: POSITIVE
WBC: 10.1 10*3/uL (ref 3.4–10.8)

## 2017-12-03 ENCOUNTER — Telehealth: Payer: Self-pay | Admitting: General Practice

## 2017-12-03 NOTE — Telephone Encounter (Signed)
-----   Message from Sharyon CableVeronica C Rogers, CNM sent at 12/01/2017  8:40 AM EDT ----- Please call patient to notify of bacterial vaginosis. Rx for Metrogel sent to pharmacy on file.

## 2017-12-03 NOTE — Telephone Encounter (Signed)
Called patient & informed her of results & medication sent to pharmacy. Patient verbalized understanding & had no questions

## 2017-12-09 ENCOUNTER — Ambulatory Visit (HOSPITAL_COMMUNITY)
Admission: RE | Admit: 2017-12-09 | Discharge: 2017-12-09 | Disposition: A | Discharge status: Home / Self Care | Payer: Medicaid Other | Source: Ambulatory Visit | Attending: Obstetrics & Gynecology | Admitting: Obstetrics & Gynecology

## 2017-12-09 DIAGNOSIS — Z363 Encounter for antenatal screening for malformations: Secondary | ICD-10-CM | POA: Diagnosis not present

## 2017-12-09 DIAGNOSIS — Z3A27 27 weeks gestation of pregnancy: Secondary | ICD-10-CM | POA: Insufficient documentation

## 2017-12-09 DIAGNOSIS — Z3A22 22 weeks gestation of pregnancy: Secondary | ICD-10-CM

## 2017-12-24 ENCOUNTER — Ambulatory Visit (INDEPENDENT_AMBULATORY_CARE_PROVIDER_SITE_OTHER): Payer: Medicaid Other | Admitting: Student

## 2017-12-24 VITALS — BP 105/43 | HR 79 | Wt 150.8 lb

## 2017-12-24 DIAGNOSIS — Z98891 History of uterine scar from previous surgery: Secondary | ICD-10-CM

## 2017-12-24 DIAGNOSIS — D573 Sickle-cell trait: Secondary | ICD-10-CM

## 2017-12-24 DIAGNOSIS — Z3483 Encounter for supervision of other normal pregnancy, third trimester: Secondary | ICD-10-CM

## 2017-12-24 NOTE — Progress Notes (Signed)
   PRENATAL VISIT NOTE  Subjective:  Linda Duffy is a 32 y.o. [id number] at [redacted]w[redacted]d being seen today for ongoing prenatal care.  She is currently monitored for the following issues for this high-risk pregnancy and has Supervision of normal pregnancy; Late prenatal care; Anemia in pregnancy; Hemoglobin A-S genotype (HCC); and History of C-section on their problem list.  Patient reports no complaints.  Contractions: Not present. Vag. Bleeding: None.  Movement: Present. Denies leaking of fluid.   The following portions of the patient's history were reviewed and updated as appropriate: allergies, current medications, past family history, past medical history, past social history, past surgical history and problem list. Problem list updated.  Objective:   Vitals:   12/24/17 1416  BP: (!) 105/43  Pulse: 79  Weight: 150 lb 12.8 oz (68.4 kg)    Fetal Status: Fetal Heart Rate (bpm): 140 Fundal Height: 30 cm Movement: Present     General:  Alert, oriented and cooperative. Patient is in no acute distress.  Skin: Skin is warm and dry. No rash noted.   Cardiovascular: Normal heart rate noted  Respiratory: Normal respiratory effort, no problems with respiration noted  Abdomen: Soft, gravid, appropriate for gestational age.  Pain/Pressure: Present     Pelvic: Cervical exam deferred        Extremities: Normal range of motion.  Edema: None  Mental Status: Normal mood and affect. Normal behavior. Normal judgment and thought content.   Assessment and Plan:  Pregnancy: [id number] at [redacted]w[redacted]d  1. Encounter for supervision of other normal pregnancy in third trimester -doing well -wants depo for contraception  2. History of C-section - c/s x 3 --- will schedule with MD next visit to discuss RCS  3. Hemoglobin A-S genotype (HCC) -discussed testing FOB. Has not been tested before but states other children do not have trait with same FOB. Discussed that we still would recommend being checked.   Preterm labor  symptoms and general obstetric precautions including but not limited to vaginal bleeding, contractions, leaking of fluid and fetal movement were reviewed in detail with the patient. Please refer to After Visit Summary for other counseling recommendations.  Return in about 2 weeks (around 01/07/2018) for Routine OB with MD to discuss repeat c/section.  Future Appointments  Date Time Provider Department Center  12/30/2017  8:50 AM [facility]  01/07/2018  1:55 PM Levie Heritage, DO [facility]    Judeth Horn, NP

## 2017-12-24 NOTE — Patient Instructions (Signed)

## 2017-12-29 ENCOUNTER — Other Ambulatory Visit: Payer: Self-pay | Admitting: General Practice

## 2017-12-29 DIAGNOSIS — Z3483 Encounter for supervision of other normal pregnancy, third trimester: Secondary | ICD-10-CM

## 2017-12-30 ENCOUNTER — Other Ambulatory Visit: Payer: Medicaid Other

## 2017-12-30 DIAGNOSIS — Z3483 Encounter for supervision of other normal pregnancy, third trimester: Secondary | ICD-10-CM

## 2017-12-31 LAB — GLUCOSE TOLERANCE, 2 HOURS W/ 1HR
GLUCOSE, 1 HOUR: 127 mg/dL (ref 65–179)
GLUCOSE, 2 HOUR: 113 mg/dL (ref 65–152)
Glucose, Fasting: 83 mg/dL (ref 65–91)

## 2018-01-07 ENCOUNTER — Ambulatory Visit (INDEPENDENT_AMBULATORY_CARE_PROVIDER_SITE_OTHER): Payer: Medicaid Other | Admitting: Family Medicine

## 2018-01-07 VITALS — BP 110/61 | HR 90 | Wt 154.0 lb

## 2018-01-07 DIAGNOSIS — D573 Sickle-cell trait: Secondary | ICD-10-CM

## 2018-01-07 DIAGNOSIS — O0930 Supervision of pregnancy with insufficient antenatal care, unspecified trimester: Secondary | ICD-10-CM

## 2018-01-07 DIAGNOSIS — O99013 Anemia complicating pregnancy, third trimester: Secondary | ICD-10-CM | POA: Diagnosis not present

## 2018-01-07 DIAGNOSIS — Z98891 History of uterine scar from previous surgery: Secondary | ICD-10-CM

## 2018-01-07 DIAGNOSIS — Z23 Encounter for immunization: Secondary | ICD-10-CM

## 2018-01-07 DIAGNOSIS — Z3483 Encounter for supervision of other normal pregnancy, third trimester: Secondary | ICD-10-CM

## 2018-01-07 NOTE — Progress Notes (Signed)
   PRENATAL VISIT NOTE  Subjective:  Linda Duffy is a 32 y.o. [id number] at [redacted]w[redacted]d being seen today for ongoing prenatal care.  She is currently monitored for the following issues for this low-risk pregnancy and has Supervision of normal pregnancy; Late prenatal care; Anemia in pregnancy; Hemoglobin A-S genotype (HCC); and History of C-section on their problem list.  Patient reports no complaints.  Contractions: Not present. Vag. Bleeding: None.  Movement: Present. Denies leaking of fluid.   The following portions of the patient's history were reviewed and updated as appropriate: allergies, current medications, past family history, past medical history, past social history, past surgical history and problem list. Problem list updated.  Objective:   Vitals:   01/07/18 1416  BP: 110/61  Pulse: 90  Weight: 154 lb (69.9 kg)    Fetal Status: Fetal Heart Rate (bpm): 140   Movement: Present     General:  Alert, oriented and cooperative. Patient is in no acute distress.  Skin: Skin is warm and dry. No rash noted.   Cardiovascular: Normal heart rate noted  Respiratory: Normal respiratory effort, no problems with respiration noted  Abdomen: Soft, gravid, appropriate for gestational age.  Pain/Pressure: Present     Pelvic: Cervical exam deferred        Extremities: Normal range of motion.  Edema: None  Mental Status: Normal mood and affect. Normal behavior. Normal judgment and thought content.   Assessment and Plan:  Pregnancy: [id number] at [redacted]w[redacted]d  1. Encounter for supervision of other normal pregnancy in third trimester FHT and FH normal - Tdap vaccine greater than or equal to 7yo IM  2. Hemoglobin A-S genotype (HCC) FOB still needs testing  3. Anemia during pregnancy in third trimester Is picking up iron tabs today. Discussed constipation with iron and pregnancy.   4. History of C-section Discussed RLTCS. Will schedule for 39 weeks.  5. Late prenatal care  Preterm labor symptoms and  general obstetric precautions including but not limited to vaginal bleeding, contractions, leaking of fluid and fetal movement were reviewed in detail with the patient. Please refer to After Visit Summary for other counseling recommendations.  No follow-ups on file.  No future appointments.  Levie Heritage, DO

## 2018-01-12 ENCOUNTER — Encounter (HOSPITAL_COMMUNITY): Payer: Self-pay

## 2018-01-21 ENCOUNTER — Ambulatory Visit (INDEPENDENT_AMBULATORY_CARE_PROVIDER_SITE_OTHER): Payer: Medicaid Other | Admitting: Student

## 2018-01-21 VITALS — BP 116/60 | HR 116 | Wt 153.8 lb

## 2018-01-21 DIAGNOSIS — Z98891 History of uterine scar from previous surgery: Secondary | ICD-10-CM

## 2018-01-21 DIAGNOSIS — Z3480 Encounter for supervision of other normal pregnancy, unspecified trimester: Secondary | ICD-10-CM

## 2018-01-21 DIAGNOSIS — O99013 Anemia complicating pregnancy, third trimester: Secondary | ICD-10-CM

## 2018-01-21 NOTE — Progress Notes (Signed)
   PRENATAL VISIT NOTE  Subjective:  Linda Duffy is a 32 y.o. [id number] at [redacted]w[redacted]d being seen today for ongoing prenatal care.  She is currently monitored for the following issues for this low-risk pregnancy and has Supervision of normal pregnancy; Late prenatal care; Anemia in pregnancy; Hemoglobin A-S genotype (HCC); and History of C-section on their problem list.  Patient reports no complaints.  Contractions: Irregular. Vag. Bleeding: None.  Movement: Present. Denies leaking of fluid.   The following portions of the patient's history were reviewed and updated as appropriate: allergies, current medications, past family history, past medical history, past social history, past surgical history and problem list. Problem list updated.  Objective:   Vitals:   01/21/18 1420  BP: 116/60  Pulse: (!) 116  Weight: 153 lb 12.8 oz (69.8 kg)    Fetal Status: Fetal Heart Rate (bpm): 147 Fundal Height: 33 cm Movement: Present     General:  Alert, oriented and cooperative. Patient is in no acute distress.  Skin: Skin is warm and dry. No rash noted.   Cardiovascular: Normal heart rate noted  Respiratory: Normal respiratory effort, no problems with respiration noted  Abdomen: Soft, gravid, appropriate for gestational age.  Pain/Pressure: Present     Pelvic: Cervical exam deferred        Extremities: Normal range of motion.  Edema: None  Mental Status: Normal mood and affect. Normal behavior. Normal judgment and thought content.   Assessment and Plan:  Pregnancy: [id number] at [redacted]w[redacted]d  1. Supervision of other normal pregnancy, antepartum  - CBC - RPR - HIV antibody  2. History of C-section -scheduled for repeat c/section 03/02/18  3. Anemia during pregnancy in third trimester  - CBC  Preterm labor symptoms and general obstetric precautions including but not limited to vaginal bleeding, contractions, leaking of fluid and fetal movement were reviewed in detail with the patient. Please refer to After  Visit Summary for other counseling recommendations.  Return in about 2 weeks (around 02/04/2018) for Routine OB.  Future Appointments  Date Time Provider Department Center  02/04/2018  2:15 PM Judeth Horn, NP [facility]  02/10/2018 10:35 AM Judeth Horn, NP [facility]  02/17/2018 10:55 AM Judeth Horn, NP [facility]    Judeth Horn, NP

## 2018-01-21 NOTE — Patient Instructions (Signed)
Before Baby Comes Home  Before your baby arrives it is important to:   Have all of the supplies that you will need to care for your baby.   Know where to go if there is an emergency.   Discuss the baby's arrival with other family members.    What supplies will I need?    It is recommended that you have the following supplies:  Large Items   Crib.   Crib mattress.   Rear-facing infant car seat. If possible, have a trained professional check to make sure that it is installed correctly.    Feeding   6-8 bottles that are 4-5 oz in size.   6-8 nipples.   Bottle brush.   Sterilizer, or a large pan or kettle with a lid.   A way to boil and cool water.   If you will be breastfeeding:  ? Breast pump.  ? Nipple cream.  ? Nursing bra.  ? Breast pads.  ? Breast shields.   If you will be formula feeding:  ? Formula.  ? Measuring cups.  ? Measuring spoons.    Bathing   Mild baby soap and baby shampoo.   Petroleum jelly.   Soft cloth towel and washcloth.   Hooded towel.   Cotton balls.   Bath basin.    Other Supplies   Rectal thermometer.   Bulb syringe.   Baby wipes or washcloths for diaper changes.   Diaper bag.   Changing pad.   Clothing, including one-piece outfits and pajamas.   Baby nail clippers.   Receiving blankets.   Mattress pad and sheets for the crib.   Night-light for the baby's room.   Baby monitor.   2 or 3 pacifiers.   Either 24-36 cloth diapers and waterproof diaper covers or a box of disposable diapers. You may need to use as many as 10-12 diapers per day.    How do I prepare for an emergency?  Prepare for an emergency by:   Knowing how to get to the nearest hospital.   Listing the phone numbers of your baby's health care providers near your home phone and in your cell phone.    How do I prepare my family?   Decide how to handle visitors.   If you have other children:  ? Talk with them about the baby coming home. Ask them how they feel about it.  ? Read a book together about  being a new big brother or sister.  ? Find ways to let them help you prepare for the new baby.  ? Have someone ready to care for them while you are in the hospital.  This information is not intended to replace advice given to you by your health care provider. Make sure you discuss any questions you have with your health care provider.  Document Released: 07/24/2008 Document Revised: 01/17/2016 Document Reviewed: 07/19/2014  Elsevier Interactive Patient Education  2018 Elsevier Inc.

## 2018-01-22 ENCOUNTER — Telehealth: Payer: Self-pay | Admitting: *Deleted

## 2018-01-22 LAB — CBC
HEMATOCRIT: 26.3 % — AB (ref 34.0–46.6)
HEMOGLOBIN: 8.5 g/dL — AB (ref 11.1–15.9)
MCH: 30.8 pg (ref 26.6–33.0)
MCHC: 32.3 g/dL (ref 31.5–35.7)
MCV: 95 fL (ref 79–97)
Platelets: 234 10*3/uL (ref 150–450)
RBC: 2.76 x10E6/uL — ABNORMAL LOW (ref 3.77–5.28)
RDW: 15 % (ref 12.3–15.4)
WBC: 11 10*3/uL — ABNORMAL HIGH (ref 3.4–10.8)

## 2018-01-22 LAB — HIV ANTIBODY (ROUTINE TESTING W REFLEX): HIV SCREEN 4TH GENERATION: NONREACTIVE

## 2018-01-22 LAB — RPR: RPR Ser Ql: NONREACTIVE

## 2018-01-22 NOTE — Telephone Encounter (Signed)
Called Dr. Emelda Fear to clarify dosage orders. Orders completed and faxed to [facility].   Called Mayana and notified her we have sent the orders and she should get a call next week early in the week and if she doesn't - to please call us back. She voices understanding.

## 2018-01-22 NOTE — Telephone Encounter (Signed)
-----   Message from Judeth Horn, NP sent at 01/22/2018  9:55 AM EDT ----- I spoke with patient and informed her need for feraheme infusion (per discussion with Dr. Adrian Blackwater) -- she is agreeable with plan.  Please get her scheduled for feraheme infusion, she needs 2 doses a week apart.  Thanks!

## 2018-01-25 ENCOUNTER — Encounter: Payer: Self-pay | Admitting: *Deleted

## 2018-01-26 ENCOUNTER — Telehealth: Payer: Self-pay | Admitting: General Practice

## 2018-01-26 NOTE — Telephone Encounter (Signed)
Patient called into office stating she hasn't received a call regarding an appt for iron infusion. Scheduled 6/10 @ 8am & I informed patient. Patient verbalized understanding & had no questions.

## 2018-01-27 ENCOUNTER — Other Ambulatory Visit: Payer: Self-pay | Admitting: Family Medicine

## 2018-01-29 ENCOUNTER — Other Ambulatory Visit (HOSPITAL_COMMUNITY): Payer: Self-pay | Admitting: *Deleted

## 2018-02-01 ENCOUNTER — Ambulatory Visit (HOSPITAL_COMMUNITY)
Admission: RE | Admit: 2018-02-01 | Discharge: 2018-02-01 | Disposition: A | Discharge status: Home / Self Care | Payer: Medicaid Other | Source: Ambulatory Visit | Attending: Obstetrics and Gynecology | Admitting: Obstetrics and Gynecology

## 2018-02-01 DIAGNOSIS — O99013 Anemia complicating pregnancy, third trimester: Secondary | ICD-10-CM | POA: Insufficient documentation

## 2018-02-01 DIAGNOSIS — Z3A33 33 weeks gestation of pregnancy: Secondary | ICD-10-CM | POA: Insufficient documentation

## 2018-02-01 MED ORDER — SODIUM CHLORIDE 0.9 % IV SOLN
510.0000 mg | INTRAVENOUS | Status: DC
  Administered 2018-02-01: 510 mg via INTRAVENOUS
  Filled 2018-02-01: qty 17

## 2018-02-01 NOTE — Discharge Instructions (Signed)

## 2018-02-04 ENCOUNTER — Other Ambulatory Visit (HOSPITAL_COMMUNITY)
Admission: RE | Admit: 2018-02-04 | Discharge: 2018-02-04 | Disposition: A | Discharge status: Home / Self Care | Payer: Medicaid Other | Source: Ambulatory Visit | Attending: Student | Admitting: Student

## 2018-02-04 ENCOUNTER — Ambulatory Visit (INDEPENDENT_AMBULATORY_CARE_PROVIDER_SITE_OTHER): Payer: Medicaid Other | Admitting: Student

## 2018-02-04 VITALS — BP 135/60 | HR 93 | Wt 152.0 lb

## 2018-02-04 DIAGNOSIS — O99013 Anemia complicating pregnancy, third trimester: Secondary | ICD-10-CM

## 2018-02-04 DIAGNOSIS — Z3480 Encounter for supervision of other normal pregnancy, unspecified trimester: Secondary | ICD-10-CM

## 2018-02-04 DIAGNOSIS — O2243 Hemorrhoids in pregnancy, third trimester: Secondary | ICD-10-CM

## 2018-02-04 NOTE — Progress Notes (Signed)
   PRENATAL VISIT NOTE  Subjective:  Linda Duffy is a 32 y.o. [id number] at 259w4d being seen today for ongoing prenatal care.  She is currently monitored for the following issues for this low-risk pregnancy and has Supervision of normal pregnancy; Late prenatal care; Anemia in pregnancy; Hemoglobin A-S genotype (HCC); and History of C-section on their problem list.  Patient reports hemorrhoid. First noticed yesterday. Denies constipation. No rectal bleeding. Has not treated symptoms.  Contractions: Irregular. Vag. Bleeding: None.  Movement: Present. Denies leaking of fluid.   The following portions of the patient's history were reviewed and updated as appropriate: allergies, current medications, past family history, past medical history, past social history, past surgical history and problem list. Problem list updated.  Objective:   Vitals:   02/04/18 1418  BP: 135/60  Pulse: 93  Weight: 152 lb (68.9 kg)    Fetal Status: Fetal Heart Rate (bpm): 145 Fundal Height: 36 cm Movement: Present     General:  Alert, oriented and cooperative. Patient is in no acute distress.  Skin: Skin is warm and dry. No rash noted.   Cardiovascular: Normal heart rate noted  Respiratory: Normal respiratory effort, no problems with respiration noted  Abdomen: Soft, gravid, appropriate for gestational age.  Pain/Pressure: Present     Pelvic: Cervical exam deferred        Extremities: Normal range of motion.  Edema: None  Rectal:  small (<1cm) flesh colored hemorrhoid; no evidence of thrombus  Mental Status: Normal mood and affect. Normal behavior. Normal judgment and thought content.   Assessment and Plan:  Pregnancy: [id number] at 549w4d  1. Supervision of other normal pregnancy, antepartum  - Culture, beta strep (group b only) - GC/Chlamydia probe amp ([place]  2. Hemorrhoids during pregnancy in third trimester -discussed treatment, OTC prep H, tucks, sitz baths.   3. Anemia during  pregnancy in third trimester -next feraheme infusion scheduled on Monday -- will recheck CBC  Preterm labor symptoms and general obstetric precautions including but not limited to vaginal bleeding, contractions, leaking of fluid and fetal movement were reviewed in detail with the patient. Please refer to After Visit Summary for other counseling recommendations.  Return in about 1 week (around 02/11/2018) for Routine OB.  Future Appointments  Date Time Provider Department Center  02/08/2018  8:00 AM [facility] ROOM 8 [facility] None  02/10/2018 10:35 AM Judeth HornLawrence, Chloe Miyoshi, NP [facility]  02/17/2018 10:55 AM Judeth HornLawrence, Sally[facility] Birkhead[facility]    Judeth HornErin Azreal Stthomas, NP

## 2018-02-04 NOTE — Patient Instructions (Addendum)
Hemorrhoids Hemorrhoids are swollen veins in and around the rectum or anus. There are two types of hemorrhoids:  Internal hemorrhoids. These occur in the veins that are just inside the rectum. They may poke through to the outside and become irritated and painful.  External hemorrhoids. These occur in the veins that are outside of the anus and can be felt as a painful swelling or hard lump near the anus.  Most hemorrhoids do not cause serious problems, and they can be managed with home treatments such as diet and lifestyle changes. If home treatments do not help your symptoms, procedures can be done to shrink or remove the hemorrhoids. What are the causes? This condition is caused by increased pressure in the anal area. This pressure may result from various things, including:  Constipation.  Straining to have a bowel movement.  Diarrhea.  Pregnancy.  Obesity.  Sitting for long periods of time.  Heavy lifting or other activity that causes you to strain.  Anal sex.  What are the signs or symptoms? Symptoms of this condition include:  Pain.  Anal itching or irritation.  Rectal bleeding.  Leakage of stool (feces).  Anal swelling.  One or more lumps around the anus.  How is this diagnosed? This condition can often be diagnosed through a visual exam. Other exams or tests may also be done, such as:  Examination of the rectal area with a gloved hand (digital rectal exam).  Examination of the anal canal using a small tube (anoscope).  A blood test, if you have lost a significant amount of blood.  A test to look inside the colon (sigmoidoscopy or colonoscopy).  How is this treated? This condition can usually be treated at home. However, various procedures may be done if dietary changes, lifestyle changes, and other home treatments do not help your symptoms. These procedures can help make the hemorrhoids smaller or remove them completely. Some of these procedures  involve surgery, and others do not. Common procedures include:  Rubber band ligation. Rubber bands are placed at the base of the hemorrhoids to cut off the blood supply to them.  Sclerotherapy. Medicine is injected into the hemorrhoids to shrink them.  Infrared coagulation. A type of light energy is used to get rid of the hemorrhoids.  Hemorrhoidectomy surgery. The hemorrhoids are surgically removed, and the veins that supply them are tied off.  Stapled hemorrhoidopexy surgery. A circular stapling device is used to remove the hemorrhoids and use staples to cut off the blood supply to them.  Follow these instructions at home: Eating and drinking  Eat foods that have a lot of fiber in them, such as whole grains, beans, nuts, fruits, and vegetables. Ask your health care provider about taking products that have added fiber (fiber supplements).  Drink enough fluid to keep your urine clear or pale yellow. Managing pain and swelling  Take warm sitz baths for 20 minutes, 3-4 times a day to ease pain and discomfort.  If directed, apply ice to the affected area. Using ice packs between sitz baths may be helpful. ? Put ice in a plastic bag. ? Place a towel between your skin and the bag. ? Leave the ice on for 20 minutes, 2-3 times a day. General instructions  Take over-the-counter and prescription medicines only as told by your health care provider.  Use medicated creams or suppositories as told.  Exercise regularly.  Go to the bathroom when you have the urge to have a bowel  movement. Do not wait.  Avoid straining to have bowel movements.  Keep the anal area dry and clean. Use wet toilet paper or moist towelettes after a bowel movement.  Do not sit on the toilet for long periods of time. This increases blood pooling and pain. Contact a health care provider if:  You have increasing pain and swelling that are not controlled by treatment or medicine.  You have uncontrolled  bleeding.  You have difficulty having a bowel movement, or you are unable to have a bowel movement.  You have pain or inflammation outside the area of the hemorrhoids. This information is not intended to replace advice given to you by your health care provider. Make sure you discuss any questions you have with your health care provider. Document Released: 08/08/2000 Document Revised: 01/09/2016 Document Reviewed: 04/25/2015 Elsevier Interactive Patient Education  2018 [employer] Before Baby Comes Home Before your baby arrives it is important to:  Have all of the supplies that you will need to care for your baby.  Know where to go if there is an emergency.  Discuss the baby's arrival with other family members.  What supplies will I need?  It is recommended that you have the following supplies: Large Items  Crib.  Crib mattress.  Rear-facing infant car seat. If possible, have a trained professional check to make sure that it is installed correctly.  Feeding  6-8 bottles that are 4-5 oz in size.  6-8 nipples.  Bottle brush.  Sterilizer, or a large pan or kettle with a lid.  A way to boil and cool water.  If you will be breastfeeding: ? Breast pump. ? Nipple cream. ? Nursing bra. ? Breast pads. ? Breast shields.  If you will be formula feeding: ? Formula. ? Measuring cups. ? Measuring spoons.  Bathing  Mild baby soap and baby shampoo.  Petroleum jelly.  Soft cloth towel and washcloth.  Hooded towel.  Cotton balls.  Bath basin.  Other Supplies  Rectal thermometer.  Bulb syringe.  Baby wipes or washcloths for diaper changes.  Diaper bag.  Changing pad.  Clothing, including one-piece outfits and pajamas.  Baby nail clippers.  Receiving blankets.  Mattress pad and sheets for the crib.  Night-light for the baby's room.  Baby monitor.  2 or 3 pacifiers.  Either 24-36 cloth diapers and waterproof diaper covers or a box of disposable  diapers. You may need to use as many as 10-12 diapers per day.  How do I prepare for an emergency? Prepare for an emergency by:  Knowing how to get to the nearest hospital.  Listing the phone numbers of your baby's health care providers near your home phone and in your cell phone.  How do I prepare my family?  Decide how to handle visitors.  If you have other children: ? Talk with them about the baby coming home. Ask them how they feel about it. ? Read a book together about being a new big brother or sister. ? Find ways to let them help you prepare for the new baby. ? Have someone ready to care for them while you are in the hospital. This information is not intended to replace advice given to you by your health care provider. Make sure you discuss any questions you have with your health care provider. Document Released: 07/24/2008 Document Revised: 01/17/2016 Document Reviewed: 07/19/2014 Elsevier Interactive Patient Education  [employer]2018 Elsevier Inc.

## 2018-02-04 NOTE — Progress Notes (Signed)
r 

## 2018-02-05 LAB — GC/CHLAMYDIA PROBE AMP (~~LOC~~) NOT AT ARMC
CHLAMYDIA, DNA PROBE: NEGATIVE
NEISSERIA GONORRHEA: NEGATIVE

## 2018-02-07 LAB — CULTURE, BETA STREP (GROUP B ONLY): Strep Gp B Culture: POSITIVE — AB

## 2018-02-08 ENCOUNTER — Other Ambulatory Visit: Payer: Self-pay | Admitting: Family Medicine

## 2018-02-08 ENCOUNTER — Ambulatory Visit (HOSPITAL_COMMUNITY)
Admission: RE | Admit: 2018-02-08 | Discharge: 2018-02-08 | Disposition: A | Discharge status: Home / Self Care | Payer: Medicaid Other | Source: Ambulatory Visit | Attending: Obstetrics and Gynecology | Admitting: Obstetrics and Gynecology

## 2018-02-08 DIAGNOSIS — Z3A35 35 weeks gestation of pregnancy: Secondary | ICD-10-CM | POA: Insufficient documentation

## 2018-02-08 DIAGNOSIS — O99013 Anemia complicating pregnancy, third trimester: Secondary | ICD-10-CM | POA: Diagnosis present

## 2018-02-08 MED ORDER — SODIUM CHLORIDE 0.9 % IV SOLN
510.0000 mg | INTRAVENOUS | Status: AC
  Administered 2018-02-08: 510 mg via INTRAVENOUS
  Filled 2018-02-08: qty 17

## 2018-02-10 ENCOUNTER — Encounter: Payer: Self-pay | Admitting: Nurse Practitioner

## 2018-02-15 ENCOUNTER — Telehealth (HOSPITAL_COMMUNITY): Payer: Self-pay | Admitting: *Deleted

## 2018-02-15 NOTE — Telephone Encounter (Signed)
Preadmission screen  

## 2018-02-16 ENCOUNTER — Telehealth (HOSPITAL_COMMUNITY): Payer: Self-pay | Admitting: *Deleted

## 2018-02-16 NOTE — Telephone Encounter (Signed)
Preadmission screen  

## 2018-02-17 ENCOUNTER — Encounter: Payer: Self-pay | Admitting: Student

## 2018-02-17 ENCOUNTER — Inpatient Hospital Stay (HOSPITAL_COMMUNITY)
Admission: AD | Admit: 2018-02-17 | Discharge: 2018-02-19 | DRG: 788 | Disposition: A | Discharge status: Home / Self Care | Payer: Medicaid Other | Attending: Obstetrics and Gynecology | Admitting: Obstetrics and Gynecology

## 2018-02-17 ENCOUNTER — Inpatient Hospital Stay (HOSPITAL_COMMUNITY): Payer: Medicaid Other | Admitting: Registered Nurse

## 2018-02-17 ENCOUNTER — Encounter (HOSPITAL_COMMUNITY)
Admission: AD | Disposition: A | Discharge status: Home / Self Care | Payer: Self-pay | Source: Home / Self Care | Attending: Obstetrics and Gynecology

## 2018-02-17 ENCOUNTER — Encounter (HOSPITAL_COMMUNITY): Payer: Self-pay | Admitting: *Deleted

## 2018-02-17 DIAGNOSIS — O4202 Full-term premature rupture of membranes, onset of labor within 24 hours of rupture: Secondary | ICD-10-CM | POA: Diagnosis not present

## 2018-02-17 DIAGNOSIS — Z3A37 37 weeks gestation of pregnancy: Secondary | ICD-10-CM

## 2018-02-17 DIAGNOSIS — O9902 Anemia complicating childbirth: Secondary | ICD-10-CM | POA: Diagnosis present

## 2018-02-17 DIAGNOSIS — Z87891 Personal history of nicotine dependence: Secondary | ICD-10-CM | POA: Diagnosis not present

## 2018-02-17 DIAGNOSIS — D573 Sickle-cell trait: Secondary | ICD-10-CM | POA: Diagnosis present

## 2018-02-17 DIAGNOSIS — O34211 Maternal care for low transverse scar from previous cesarean delivery: Secondary | ICD-10-CM | POA: Diagnosis present

## 2018-02-17 DIAGNOSIS — Z3480 Encounter for supervision of other normal pregnancy, unspecified trimester: Secondary | ICD-10-CM

## 2018-02-17 DIAGNOSIS — O99824 Streptococcus B carrier state complicating childbirth: Secondary | ICD-10-CM | POA: Diagnosis present

## 2018-02-17 DIAGNOSIS — Z98891 History of uterine scar from previous surgery: Secondary | ICD-10-CM

## 2018-02-17 LAB — CBC
HCT: 25.5 % — ABNORMAL LOW (ref 36.0–46.0)
HEMATOCRIT: 27.2 % — AB (ref 36.0–46.0)
HEMOGLOBIN: 8.9 g/dL — AB (ref 12.0–15.0)
Hemoglobin: 9.4 g/dL — ABNORMAL LOW (ref 12.0–15.0)
MCH: 32.5 pg (ref 26.0–34.0)
MCH: 32.8 pg (ref 26.0–34.0)
MCHC: 34.6 g/dL (ref 30.0–36.0)
MCHC: 34.9 g/dL (ref 30.0–36.0)
MCV: 94.1 fL (ref 78.0–100.0)
MCV: 94.1 fL (ref 78.0–100.0)
PLATELETS: 166 10*3/uL (ref 150–400)
Platelets: 183 10*3/uL (ref 150–400)
RBC: 2.89 MIL/uL — ABNORMAL LOW (ref 3.87–5.11)
RBC: 2.71 MIL/uL — AB (ref 3.87–5.11)
RDW: 15 % (ref 11.5–15.5)
RDW: 15 % (ref 11.5–15.5)
WBC: 8.9 10*3/uL (ref 4.0–10.5)
WBC: 13.2 10*3/uL — AB (ref 4.0–10.5)

## 2018-02-17 LAB — CREATININE, SERUM
Creatinine, Ser: 0.46 mg/dL (ref 0.44–1.00)
GFR calc non Af Amer: >60 mL/min (ref >60)

## 2018-02-17 LAB — RAPID HIV SCREEN (HIV 1/2 AB+AG)
HIV 1/2 Antibodies: NONREACTIVE
HIV-1 P24 ANTIGEN - HIV24: NONREACTIVE

## 2018-02-17 LAB — POCT FERN TEST: POCT FERN TEST: POSITIVE

## 2018-02-17 LAB — TYPE AND SCREEN
ABO/RH(D): O POS
Antibody Screen: NEGATIVE

## 2018-02-17 SURGERY — Surgical Case
Anesthesia: Spinal

## 2018-02-17 MED ORDER — NALBUPHINE HCL 10 MG/ML IJ SOLN: 5.0000 mg | INTRAMUSCULAR | Status: DC | PRN

## 2018-02-17 MED ORDER — MORPHINE SULFATE (PF) 0.5 MG/ML IJ SOLN
INTRAMUSCULAR | Status: DC | PRN
  Administered 2018-02-17: .2 mg via INTRATHECAL

## 2018-02-17 MED ORDER — METHYLERGONOVINE MALEATE 0.2 MG/ML IJ SOLN
INTRAMUSCULAR | Status: DC | PRN
  Administered 2018-02-17: 0.2 mg via INTRAMUSCULAR

## 2018-02-17 MED ORDER — DIPHENHYDRAMINE HCL 50 MG/ML IJ SOLN: 12.5000 mg | INTRAMUSCULAR | Status: DC | PRN

## 2018-02-17 MED ORDER — CEFAZOLIN SODIUM-DEXTROSE 2-4 GM/100ML-% IV SOLN
2.0000 g | INTRAVENOUS | Status: AC
  Administered 2018-02-17: 2 g via INTRAVENOUS
  Filled 2018-02-17: qty 100

## 2018-02-17 MED ORDER — SCOPOLAMINE 1 MG/3DAYS TD PT72: 1.0000 | MEDICATED_PATCH | Freq: Once | TRANSDERMAL | Status: DC

## 2018-02-17 MED ORDER — SCOPOLAMINE 1 MG/3DAYS TD PT72
MEDICATED_PATCH | TRANSDERMAL | Status: AC
  Filled 2018-02-17: qty 1

## 2018-02-17 MED ORDER — ONDANSETRON HCL 4 MG/2ML IJ SOLN
INTRAMUSCULAR | Status: AC
  Filled 2018-02-17: qty 2

## 2018-02-17 MED ORDER — LACTATED RINGERS IV SOLN
125.0000 mL/h | INTRAVENOUS | Status: DC
  Administered 2018-02-17: 13:00:00 via INTRAVENOUS
  Administered 2018-02-17: 125 mL/h via INTRAVENOUS
  Administered 2018-02-17: 14:00:00 via INTRAVENOUS

## 2018-02-17 MED ORDER — TETANUS-DIPHTH-ACELL PERTUSSIS 5-2.5-18.5 LF-MCG/0.5 IM SUSP: 0.5000 mL | Freq: Once | INTRAMUSCULAR | Status: DC

## 2018-02-17 MED ORDER — DIBUCAINE 1 % RE OINT: 1.0000 "application " | TOPICAL_OINTMENT | RECTAL | Status: DC | PRN

## 2018-02-17 MED ORDER — KETOROLAC TROMETHAMINE 30 MG/ML IJ SOLN: 30.0000 mg | Freq: Once | INTRAMUSCULAR | Status: DC | PRN

## 2018-02-17 MED ORDER — PHENYLEPHRINE 8 MG IN D5W 100 ML (0.08MG/ML) PREMIX OPTIME
INJECTION | INTRAVENOUS | Status: DC | PRN
  Administered 2018-02-17: 60 ug/min via INTRAVENOUS

## 2018-02-17 MED ORDER — MENTHOL 3 MG MT LOZG: 1.0000 | LOZENGE | OROMUCOSAL | Status: DC | PRN

## 2018-02-17 MED ORDER — DEXAMETHASONE SODIUM PHOSPHATE 4 MG/ML IJ SOLN
INTRAMUSCULAR | Status: DC | PRN
  Administered 2018-02-17: 4 mg via INTRAVENOUS

## 2018-02-17 MED ORDER — WITCH HAZEL-GLYCERIN EX PADS: 1.0000 "application " | MEDICATED_PAD | CUTANEOUS | Status: DC | PRN

## 2018-02-17 MED ORDER — METHYLERGONOVINE MALEATE 0.2 MG/ML IJ SOLN
INTRAMUSCULAR | Status: AC
  Filled 2018-02-17: qty 1

## 2018-02-17 MED ORDER — OXYTOCIN 10 UNIT/ML IJ SOLN
INTRAVENOUS | Status: DC | PRN
  Administered 2018-02-17: 40 [IU] via INTRAVENOUS

## 2018-02-17 MED ORDER — ENOXAPARIN SODIUM 40 MG/0.4ML ~~LOC~~ SOLN
40.0000 mg | SUBCUTANEOUS | Status: DC
  Administered 2018-02-18 – 2018-02-19 (×2): 40 mg via SUBCUTANEOUS
  Filled 2018-02-17 (×2): qty 0.4

## 2018-02-17 MED ORDER — SENNOSIDES-DOCUSATE SODIUM 8.6-50 MG PO TABS
2.0000 | ORAL_TABLET | ORAL | Status: DC
  Administered 2018-02-17 – 2018-02-18 (×2): 2 via ORAL
  Filled 2018-02-17 (×2): qty 2

## 2018-02-17 MED ORDER — BUPIVACAINE IN DEXTROSE 0.75-8.25 % IT SOLN
INTRATHECAL | Status: DC | PRN
  Administered 2018-02-17: 1.4 mL via INTRATHECAL

## 2018-02-17 MED ORDER — OXYCODONE HCL 5 MG PO TABS
5.0000 mg | ORAL_TABLET | ORAL | Status: DC | PRN
  Administered 2018-02-18 – 2018-02-19 (×6): 5 mg via ORAL
  Filled 2018-02-17 (×6): qty 1

## 2018-02-17 MED ORDER — PHENYLEPHRINE 40 MCG/ML (10ML) SYRINGE FOR IV PUSH (FOR BLOOD PRESSURE SUPPORT)
PREFILLED_SYRINGE | INTRAVENOUS | Status: AC
  Filled 2018-02-17: qty 10

## 2018-02-17 MED ORDER — TRANEXAMIC ACID 1000 MG/10ML IV SOLN
1000.0000 mg | INTRAVENOUS | Status: AC
  Administered 2018-02-17: 1000 mg via INTRAVENOUS
  Filled 2018-02-17: qty 1100

## 2018-02-17 MED ORDER — DEXAMETHASONE SODIUM PHOSPHATE 4 MG/ML IJ SOLN
INTRAMUSCULAR | Status: AC
  Filled 2018-02-17: qty 1

## 2018-02-17 MED ORDER — PRENATAL MULTIVITAMIN CH
1.0000 | ORAL_TABLET | Freq: Every day | ORAL | Status: DC
  Administered 2018-02-18 – 2018-02-19 (×2): 1 via ORAL
  Filled 2018-02-17 (×2): qty 1

## 2018-02-17 MED ORDER — MEPERIDINE HCL 25 MG/ML IJ SOLN
INTRAMUSCULAR | Status: DC | PRN
  Administered 2018-02-17 (×2): 12.5 mg via INTRAVENOUS

## 2018-02-17 MED ORDER — HYDROMORPHONE HCL 1 MG/ML IJ SOLN: 0.2500 mg | INTRAMUSCULAR | Status: DC | PRN

## 2018-02-17 MED ORDER — SODIUM CHLORIDE 0.9% FLUSH: 3.0000 mL | INTRAVENOUS | Status: DC | PRN

## 2018-02-17 MED ORDER — SIMETHICONE 80 MG PO CHEW
80.0000 mg | CHEWABLE_TABLET | ORAL | Status: DC
  Administered 2018-02-17 – 2018-02-18 (×2): 80 mg via ORAL
  Filled 2018-02-17 (×2): qty 1

## 2018-02-17 MED ORDER — SODIUM CHLORIDE 0.9 % IR SOLN
Status: DC | PRN
  Administered 2018-02-17: 1

## 2018-02-17 MED ORDER — MEPERIDINE HCL 25 MG/ML IJ SOLN: 6.2500 mg | INTRAMUSCULAR | Status: DC | PRN

## 2018-02-17 MED ORDER — ONDANSETRON HCL 4 MG/2ML IJ SOLN
INTRAMUSCULAR | Status: DC | PRN
  Administered 2018-02-17: 4 mg via INTRAVENOUS

## 2018-02-17 MED ORDER — OXYCODONE HCL 5 MG PO TABS: 10.0000 mg | ORAL_TABLET | ORAL | Status: DC | PRN

## 2018-02-17 MED ORDER — DIPHENHYDRAMINE HCL 25 MG PO CAPS: 25.0000 mg | ORAL_CAPSULE | Freq: Four times a day (QID) | ORAL | Status: DC | PRN

## 2018-02-17 MED ORDER — ACETAMINOPHEN 500 MG PO TABS
1000.0000 mg | ORAL_TABLET | Freq: Four times a day (QID) | ORAL | Status: DC
  Administered 2018-02-18 (×3): 1000 mg via ORAL
  Filled 2018-02-17 (×3): qty 2

## 2018-02-17 MED ORDER — OXYTOCIN 10 UNIT/ML IJ SOLN
INTRAMUSCULAR | Status: AC
Start: 2018-02-17 — End: ?
  Filled 2018-02-17: qty 1

## 2018-02-17 MED ORDER — METOCLOPRAMIDE HCL 5 MG/ML IJ SOLN
INTRAMUSCULAR | Status: DC | PRN
  Administered 2018-02-17: 5 mg via INTRAVENOUS

## 2018-02-17 MED ORDER — SCOPOLAMINE 1 MG/3DAYS TD PT72
MEDICATED_PATCH | TRANSDERMAL | Status: DC | PRN
  Administered 2018-02-17: 1 via TRANSDERMAL

## 2018-02-17 MED ORDER — METOCLOPRAMIDE HCL 5 MG/ML IJ SOLN
INTRAMUSCULAR | Status: AC
  Filled 2018-02-17: qty 2

## 2018-02-17 MED ORDER — CEFAZOLIN SODIUM-DEXTROSE 2-4 GM/100ML-% IV SOLN: 2.0000 g | INTRAVENOUS | Status: DC

## 2018-02-17 MED ORDER — MORPHINE SULFATE (PF) 0.5 MG/ML IJ SOLN
INTRAMUSCULAR | Status: AC
  Filled 2018-02-17: qty 10

## 2018-02-17 MED ORDER — NALBUPHINE HCL 10 MG/ML IJ SOLN: 5.0000 mg | Freq: Once | INTRAMUSCULAR | Status: DC | PRN

## 2018-02-17 MED ORDER — SODIUM CHLORIDE 0.9 % IV SOLN
500.0000 mg | Freq: Once | INTRAVENOUS | Status: AC
  Administered 2018-02-17: 500 mg via INTRAVENOUS
  Filled 2018-02-17: qty 500

## 2018-02-17 MED ORDER — ZOLPIDEM TARTRATE 5 MG PO TABS: 5.0000 mg | ORAL_TABLET | Freq: Every evening | ORAL | Status: DC | PRN

## 2018-02-17 MED ORDER — PHENYLEPHRINE HCL 10 MG/ML IJ SOLN
INTRAMUSCULAR | Status: DC | PRN
  Administered 2018-02-17 (×2): 120 ug via INTRAVENOUS

## 2018-02-17 MED ORDER — ONDANSETRON HCL 4 MG/2ML IJ SOLN: 4.0000 mg | Freq: Three times a day (TID) | INTRAMUSCULAR | Status: DC | PRN

## 2018-02-17 MED ORDER — DIPHENHYDRAMINE HCL 25 MG PO CAPS
25.0000 mg | ORAL_CAPSULE | ORAL | Status: DC | PRN
  Filled 2018-02-17: qty 1

## 2018-02-17 MED ORDER — COCONUT OIL OIL: 1.0000 "application " | TOPICAL_OIL | Status: DC | PRN

## 2018-02-17 MED ORDER — LACTATED RINGERS IV SOLN
INTRAVENOUS | Status: DC
  Administered 2018-02-17: 11:00:00 via INTRAVENOUS

## 2018-02-17 MED ORDER — LACTATED RINGERS IV SOLN: INTRAVENOUS | Status: DC

## 2018-02-17 MED ORDER — NALOXONE HCL 4 MG/10ML IJ SOLN
1.0000 ug/kg/h | INTRAVENOUS | Status: DC | PRN
  Filled 2018-02-17: qty 5

## 2018-02-17 MED ORDER — FENTANYL CITRATE (PF) 100 MCG/2ML IJ SOLN
INTRAMUSCULAR | Status: AC
  Filled 2018-02-17: qty 2

## 2018-02-17 MED ORDER — FENTANYL CITRATE (PF) 100 MCG/2ML IJ SOLN
INTRAMUSCULAR | Status: DC | PRN
  Administered 2018-02-17: 20 ug via INTRATHECAL

## 2018-02-17 MED ORDER — KETOROLAC TROMETHAMINE 30 MG/ML IJ SOLN
30.0000 mg | Freq: Four times a day (QID) | INTRAMUSCULAR | Status: DC | PRN
  Administered 2018-02-17: 30 mg via INTRAMUSCULAR

## 2018-02-17 MED ORDER — FERROUS SULFATE 325 (65 FE) MG PO TABS
325.0000 mg | ORAL_TABLET | Freq: Two times a day (BID) | ORAL | Status: DC
  Administered 2018-02-17 – 2018-02-19 (×4): 325 mg via ORAL
  Filled 2018-02-17 (×4): qty 1

## 2018-02-17 MED ORDER — SOD CITRATE-CITRIC ACID 500-334 MG/5ML PO SOLN
30.0000 mL | ORAL | Status: AC
  Administered 2018-02-17: 30 mL via ORAL
  Filled 2018-02-17: qty 15

## 2018-02-17 MED ORDER — IBUPROFEN 600 MG PO TABS
600.0000 mg | ORAL_TABLET | Freq: Four times a day (QID) | ORAL | Status: DC
  Administered 2018-02-17 – 2018-02-19 (×7): 600 mg via ORAL
  Filled 2018-02-17 (×7): qty 1

## 2018-02-17 MED ORDER — SIMETHICONE 80 MG PO CHEW
80.0000 mg | CHEWABLE_TABLET | Freq: Three times a day (TID) | ORAL | Status: DC
  Administered 2018-02-18 – 2018-02-19 (×5): 80 mg via ORAL
  Filled 2018-02-17 (×5): qty 1

## 2018-02-17 MED ORDER — OXYTOCIN 40 UNITS IN LACTATED RINGERS INFUSION - SIMPLE MED: 2.5000 [IU]/h | INTRAVENOUS | Status: DC

## 2018-02-17 MED ORDER — MEPERIDINE HCL 25 MG/ML IJ SOLN
INTRAMUSCULAR | Status: AC
  Filled 2018-02-17: qty 1

## 2018-02-17 MED ORDER — PHENYLEPHRINE 8 MG IN D5W 100 ML (0.08MG/ML) PREMIX OPTIME
INJECTION | INTRAVENOUS | Status: AC
  Filled 2018-02-17: qty 100

## 2018-02-17 MED ORDER — PROMETHAZINE HCL 25 MG/ML IJ SOLN: 6.2500 mg | INTRAMUSCULAR | Status: DC | PRN

## 2018-02-17 MED ORDER — OXYTOCIN 10 UNIT/ML IJ SOLN
INTRAMUSCULAR | Status: AC
  Filled 2018-02-17: qty 1

## 2018-02-17 MED ORDER — NALOXONE HCL 0.4 MG/ML IJ SOLN: 0.4000 mg | INTRAMUSCULAR | Status: DC | PRN

## 2018-02-17 MED ORDER — BUPIVACAINE IN DEXTROSE 0.75-8.25 % IT SOLN: INTRATHECAL | Status: DC | PRN

## 2018-02-17 MED ORDER — KETOROLAC TROMETHAMINE 30 MG/ML IJ SOLN
INTRAMUSCULAR | Status: AC
  Administered 2018-02-17: 30 mg via INTRAMUSCULAR
  Filled 2018-02-17: qty 1

## 2018-02-17 MED ORDER — ACETAMINOPHEN 325 MG PO TABS
650.0000 mg | ORAL_TABLET | ORAL | Status: DC | PRN
  Administered 2018-02-18: 650 mg via ORAL
  Filled 2018-02-17: qty 2

## 2018-02-17 MED ORDER — SIMETHICONE 80 MG PO CHEW: 80.0000 mg | CHEWABLE_TABLET | ORAL | Status: DC | PRN

## 2018-02-17 MED ORDER — KETOROLAC TROMETHAMINE 30 MG/ML IJ SOLN: 30.0000 mg | Freq: Four times a day (QID) | INTRAMUSCULAR | Status: DC | PRN

## 2018-02-17 SURGICAL SUPPLY — 40 items
APL SKNCLS STERI-STRIP NONHPOA (GAUZE/BANDAGES/DRESSINGS) ×1
BENZOIN TINCTURE PRP APPL 2/3 (GAUZE/BANDAGES/DRESSINGS) ×3 IMPLANT
CHLORAPREP W/TINT 26ML (MISCELLANEOUS) ×3 IMPLANT
CLAMP CORD UMBIL (MISCELLANEOUS) IMPLANT
CLOSURE STERI-STRIP 1/2X4 (GAUZE/BANDAGES/DRESSINGS) ×1
CLOSURE WOUND 1/2 X4 (GAUZE/BANDAGES/DRESSINGS) ×1
CLOTH BEACON ORANGE TIMEOUT ST (SAFETY) ×3 IMPLANT
CLSR STERI-STRIP ANTIMIC 1/2X4 (GAUZE/BANDAGES/DRESSINGS) ×1 IMPLANT
DRSG OPSITE POSTOP 4X10 (GAUZE/BANDAGES/DRESSINGS) ×3 IMPLANT
ELECT REM PT RETURN 9FT ADLT (ELECTROSURGICAL) ×3
ELECTRODE REM PT RTRN 9FT ADLT (ELECTROSURGICAL) ×1 IMPLANT
EXTRACTOR VACUUM M CUP 4 TUBE (SUCTIONS) IMPLANT
EXTRACTOR VACUUM M CUP 4' TUBE (SUCTIONS)
GLOVE BIOGEL PI IND STRL 7.0 (GLOVE) ×2 IMPLANT
GLOVE BIOGEL PI IND STRL 7.5 (GLOVE) ×2 IMPLANT
GLOVE BIOGEL PI INDICATOR 7.0 (GLOVE) ×4
GLOVE BIOGEL PI INDICATOR 7.5 (GLOVE) ×4
GLOVE ECLIPSE 7.5 STRL STRAW (GLOVE) ×3 IMPLANT
GOWN STRL REUS W/TWL LRG LVL3 (GOWN DISPOSABLE) ×9 IMPLANT
HEMOSTAT ARISTA ABSORB 3G PWDR (MISCELLANEOUS) ×2 IMPLANT
KIT ABG SYR 3ML LUER SLIP (SYRINGE) IMPLANT
NDL HYPO 25X5/8 SAFETYGLIDE (NEEDLE) IMPLANT
NEEDLE HYPO 25X5/8 SAFETYGLIDE (NEEDLE) IMPLANT
NS IRRIG 1000ML POUR BTL (IV SOLUTION) ×3 IMPLANT
PACK C SECTION WH (CUSTOM PROCEDURE TRAY) ×3 IMPLANT
PAD ABD 7.5X8 STRL (GAUZE/BANDAGES/DRESSINGS) ×4 IMPLANT
PAD OB MATERNITY 4.3X12.25 (PERSONAL CARE ITEMS) ×3 IMPLANT
PENCIL SMOKE EVAC W/HOLSTER (ELECTROSURGICAL) ×3 IMPLANT
RTRCTR C-SECT PINK 25CM LRG (MISCELLANEOUS) ×3 IMPLANT
SPONGE LAP 18X18 RF (DISPOSABLE) ×4 IMPLANT
STRIP CLOSURE SKIN 1/2X4 (GAUZE/BANDAGES/DRESSINGS) ×2 IMPLANT
SUT MON AB 4-0 PS1 27 (SUTURE) ×2 IMPLANT
SUT PDS AB 0 CTX 60 (SUTURE) ×2 IMPLANT
SUT VIC AB 0 CTX 36 (SUTURE) ×15
SUT VIC AB 0 CTX36XBRD ANBCTRL (SUTURE) ×3 IMPLANT
SUT VIC AB 2-0 CT1 27 (SUTURE) ×3
SUT VIC AB 2-0 CT1 TAPERPNT 27 (SUTURE) ×1 IMPLANT
SUT VIC AB 4-0 KS 27 (SUTURE) ×3 IMPLANT
TOWEL OR 17X24 6PK STRL BLUE (TOWEL DISPOSABLE) ×3 IMPLANT
TRAY FOLEY W/BAG SLVR 14FR LF (SET/KITS/TRAYS/PACK) ×3 IMPLANT

## 2018-02-17 NOTE — MAU Note (Signed)
Pt presents to MAU with complaints of ROM clear fluid this morning at 0748. +FM

## 2018-02-17 NOTE — Anesthesia Procedure Notes (Signed)
Spinal  Patient location during procedure: OR Start time: 02/17/2018 1:15 PM End time: 02/17/2018 1:18 PM Staffing Anesthesiologist: Leilani AbleHatchett, Justyn Langham, MD Performed: anesthesiologist  Preanesthetic Checklist Completed: patient identified, site marked, surgical consent, pre-op evaluation, timeout performed, IV checked, risks and benefits discussed and monitors and equipment checked Spinal Block Patient position: sitting Prep: site prepped and draped and DuraPrep Patient monitoring: continuous pulse ox and blood pressure Approach: midline Location: L3-4 Injection technique: single-shot Needle Needle type: Pencan  Needle gauge: 24 G Needle length: 10 cm Needle insertion depth: 4 cm Assessment Sensory level: T4

## 2018-02-17 NOTE — Transfer of Care (Signed)
Immediate Anesthesia Transfer of Care Note  Patient: Linda Duffy  Procedure(s) Performed: REPEAT CESAREAN SECTION (N/A )  Patient Location: PACU  Anesthesia Type:Spinal  Level of Consciousness: awake, alert , oriented and patient cooperative  Airway & Oxygen Therapy: Patient Spontanous Breathing  Post-op Assessment: Report given to RN  Post vital signs: Reviewed and stable  Last Vitals:  Vitals Value Taken Time  BP    Temp    Pulse 25 02/17/2018  2:58 PM  Resp 9 02/17/2018  2:58 PM  SpO2 82 % 02/17/2018  2:58 PM  Vitals shown include unvalidated device data.  Last Pain: There were no vitals filed for this visit.       Complications: No apparent anesthesia complications

## 2018-02-17 NOTE — Op Note (Signed)
Linda Duffy PROCEDURE DATE: 02/17/2018  PREOPERATIVE DIAGNOSES: Intrauterine pregnancy at 3078w3d weeks gestation; prior c-section x3, rupture of membranes  POSTOPERATIVE DIAGNOSES: The same  PROCEDURE: Repeat Low Transverse Cesarean Section  SURGEON:  Dr. Shonna ChockNoah Wouk  ASSISTANT:  Dr. Caryl AdaJazma Sherice Ijames (OB Fellow)  ANESTHESIOLOGY TEAM: Anesthesiologist: Leilani AbleHatchett, Franklin, MD CRNA: Trellis PaganiniBrewer, Suzanne N, CRNA  INDICATIONS: Linda Duffy is a 32 y.o. [phone] at 6878w3d here for cesarean section secondary to the indications listed under preoperative diagnoses; please see preoperative note for further details.  The risks of cesarean section were discussed with the patient including but were not limited to: bleeding which may require transfusion or reoperation; infection which may require antibiotics; injury to bowel, bladder, ureters or other surrounding organs; injury to the fetus; need for additional procedures including hysterectomy in the event of a life-threatening hemorrhage; placental abnormalities wth subsequent pregnancies, incisional problems, thromboembolic phenomenon and other postoperative/anesthesia complications.   The patient concurred with the proposed plan, giving informed written consent for the procedure.    FINDINGS:  Viable female infant in cephalic presentation.  Apgars 8 and 9.  Clear amniotic fluid.  Intact placenta, three vessel cord.  Normal uterus, fallopian tubes and ovaries bilaterally. Moderate dense adhesions.  ANESTHESIA: Spinal INTRAVENOUS FLUIDS: 2000 ml   ESTIMATED BLOOD LOSS: 886 ml URINE OUTPUT:  350 ml SPECIMENS: Placenta sent to L&D COMPLICATIONS: None immediate  PROCEDURE IN DETAIL:  The patient preoperatively received intravenous antibiotics and had sequential compression devices applied to her lower extremities.  She was then taken to the operating room where spinal anesthesia was administered and was found to be adequate. She was then placed in a dorsal supine  position with a leftward tilt, and prepped and draped in a sterile manner.  A foley catheter was placed into her bladder and attached to constant gravity.  After an adequate timeout was performed, a Pfannenstiel skin incision was made with scalpel over her preexisting scar and carried through to the underlying layer of fascia. The fascia was incised in the midline, and this incision was extended bilaterally using the Mayo scissors.  Kocher clamps were applied to the superior aspect of the fascial incision and the underlying rectus muscles were dissected off bluntly.  A similar process was carried out on the inferior aspect of the fascial incision. The rectus muscles were separated in the midline bluntly and the peritoneum was entered bluntly. Alexis retractor used for visualization. The utero-vesical peritoneal reflection was incised transversely and the bladder flap was bluntly freed from the lower uterine segment.  Attention was turned to the lower uterine segment where a low transverse hysterotomy was made with a scalpel and extended bilaterally bluntly. The infant was successfully delivered, the cord was clamped and cut after one minute, and the infant was handed over to the awaiting neonatology team. Uterine massage was then administered, and the placenta delivered intact with a three-vessel cord. The uterus was then cleared of clots and debris.  The hysterotomy was closed with 0 Vicryl in a running locked fashion, and an imbricating layer was also placed with 0 Vicryl.  Figure-of-eight 0 Vicryl serosal stitches were placed to help with hemostasis.  The pelvis was cleared of all clot and debris. Hemostasis was confirmed on all surfaces. Alexis retractor removed. The peritoneum was closed with a 0 Vicryl running stitch. The fascia was then closed using 0 PDS in a running fashion.  The subcutaneous layer was irrigated.  The skin was closed with a 4-0 Monocryl subcuticular stitch. The patient  tolerated the  procedure well. Sponge, lap, instrument and needle counts were correct x 3.  She was taken to the recovery room in stable condition.    Caryl Ada, DO OB Fellow Center for [facility], [facility]

## 2018-02-17 NOTE — Anesthesia Postprocedure Evaluation (Signed)
Anesthesia Post Note  Patient: OceanographerTiara Chauca  Procedure(s) Performed: REPEAT CESAREAN SECTION (N/A )     Patient location during evaluation: PACU Anesthesia Type: Spinal Level of consciousness: awake Pain management: pain level controlled Vital Signs Assessment: post-procedure vital signs reviewed and stable Respiratory status: spontaneous breathing Cardiovascular status: stable Postop Assessment: no headache, no backache, spinal receding, patient able to bend at knees and no apparent nausea or vomiting Anesthetic complications: no    Last Vitals:  Vitals:   02/17/18 1500 02/17/18 1515  BP: 100/63 107/66  Pulse: (!) 58 67  Resp: 16 16  Temp: 36.6 C   SpO2: 100% 100%    Last Pain:  Vitals:   02/17/18 1500  TempSrc: Oral  PainSc: 5    Pain Goal:                 Keionte Swicegood JR,JOHN Yasenia Reedy

## 2018-02-17 NOTE — Anesthesia Preprocedure Evaluation (Signed)
Anesthesia Evaluation  Patient identified by MRN, date of birth, ID band Patient awake    Reviewed: Allergy & Precautions, NPO status , Patient's Chart, lab work & pertinent test results  Airway Mallampati: I  TM Distance: >3 FB Neck ROM: Full    Dental no notable dental hx. (+) Teeth Intact   Pulmonary former smoker,    Pulmonary exam normal breath sounds clear to auscultation       Cardiovascular negative cardio ROS Normal cardiovascular exam Rhythm:Regular Rate:Normal     Neuro/Psych negative neurological ROS  negative psych ROS   GI/Hepatic negative GI ROS, Neg liver ROS,   Endo/Other  negative endocrine ROS  Renal/GU negative Renal ROS  negative genitourinary   Musculoskeletal negative musculoskeletal ROS (+)   Abdominal Normal abdominal exam  (+)   Peds  Hematology  (+) anemia ,   Anesthesia Other Findings   Reproductive/Obstetrics (+) Pregnancy Previous C/section                             Anesthesia Physical  Anesthesia Plan  ASA: II  Anesthesia Plan: Spinal   Post-op Pain Management:    Induction:   PONV Risk Score and Plan: 3 and Ondansetron, Dexamethasone and Scopolamine patch - Pre-op  Airway Management Planned: Natural Airway and Nasal Cannula  Additional Equipment:   Intra-op Plan:   Post-operative Plan:   Informed Consent: I have reviewed the patients History and Physical, chart, labs and discussed the procedure including the risks, benefits and alternatives for the proposed anesthesia with the patient or authorized representative who has indicated his/her understanding and acceptance.     Plan Discussed with: CRNA and Surgeon  Anesthesia Plan Comments:         Anesthesia Quick Evaluation

## 2018-02-17 NOTE — H&P (Signed)
Obstetric Preoperative History and Physical  Linda Duffy is a 32 y.o. [id number] with IUP at 254w3d presenting for repeat cesarean section due to SROM at 0748.  No acute concerns.   [facility]  Pregnancy complications or risks: -Anemia: received x2 IV Feraheme transfusions   Patient Active Problem List   Diagnosis Date Noted  . History of C-section 11/26/2017  . 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 Anemia in pregnancy 06/09/2016  . Late prenatal care 06/02/2016  . Supervision of normal pregnancy 04/11/2011   She plans to breastfeed She desires Depo-Provera for postpartum contraception.   Prenatal labs and studies: ABO, Rh: O/Positive/-- (04/03 1539) Antibody: Negative (04/03 1539) Rubella: 1.50 (04/03 1539) RPR: Non Reactive (05/30 1442)  HBsAg: Negative (04/03 1539)  HIV: Non Reactive (05/30 1442)  GBS: Positive 2 hr Glucola normal Genetic screening declined Anatomy US normal  Prenatal Transfer Tool  Maternal Diabetes: No Genetic Screening: Declined Maternal Ultrasounds/Referrals: Normal Fetal Ultrasounds or other Referrals:  None Maternal Substance Abuse:  No Significant Maternal Medications:  None Significant Maternal Lab Results: Lab values include: Group B Strep positive  Past Medical History:  Diagnosis Date  . Abnormal Pap smear    had cryo after 1st c-section and nl after that  . Anemia   . 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 Ovarian cyst 2012    Past Surgical History:  Procedure Laterality Date  . CERVIX LESION DESTRUCTION     after 1st c-section and nl paps after that  . CESAREAN SECTION     x1  . CESAREAN SECTION N/A 05/24/2015   Procedure: REPEAT CESAREAN SECTION;  Surgeon: Kathreen CosierBernard A Marshall, MD;  Location: [facility];  Service: Obstetrics;  Laterality: N/A;  . CESAREAN SECTION N/A 07/04/2016   Procedure: CESAREAN SECTION;  Surgeon: Willodean Rosenthalarolyn Harraway-Smith, MD;  [facility];  Service: Obstetrics;   Laterality: N/A;  . DILATION AND CURETTAGE OF UTERUS     x2 for EABs    OB History  Gravida Para Term Preterm AB Living  6 3 3  0 2 3  SAB TAB Ectopic Multiple Live Births  0 2 0 0 3    # Outcome Date GA Lbr Len/2nd Weight Sex Delivery Anes PTL Lv  6 Current           5 Term 07/04/16 465w2d  3.26 kg (7 lb 3 oz) M CS-LTranv Spinal  LIV  4 Term 05/24/15 4868w0d  2.665 kg (5 lb 14 oz) F CS-Vac Spinal  LIV     Birth Comments: Normal exam.  3 Term 07/04/03     CS-LTranv None N LIV  2 TAB           1 TAB             Social History   Socioeconomic History  . Marital status: Single    Spouse name: Not on file  . Number of children: Not on file  . Years of education: Not on file  . Highest education level: Not on file  Occupational History  . Not on file  Social Needs  . Financial resource strain: Not on file  . Food insecurity:    Worry: Not on file    Inability: Not on file  . Transportation needs:    Medical: Not on file    Non-medical: Not on file  Tobacco Use  . Smoking status: Former Smoker    Packs/day: 0.25    Types: Cigarettes  Last attempt to quit: 09/18/2014    Years since quitting: 3.4  . Smokeless tobacco: Never Used  Substance and Sexual Activity  . Alcohol use: No  . Drug use: Yes    Types: Marijuana    Comment: not since she found out she was pregnant  . Sexual activity: Yes    Birth control/protection: None  Lifestyle  . Physical activity:    Days per week: Not on file    Minutes per session: Not on file  . Stress: Not on file  Relationships  . Social connections:    Talks on phone: Not on file    Gets together: Not on file    Attends religious service: Not on file    Active member of club or organization: Not on file    Attends meetings of clubs or organizations: Not on file    Relationship status: Not on file  Other Topics Concern  . Not on file  Social History Narrative  . Not on file    Family History  Problem Relation Age of Onset  .  Diabetes Mother   . Hypertension Mother   . Heart disease Mother     Medications Prior to Admission  Medication Sig Dispense Refill Last Dose  . acidophilus (RISAQUAD) CAPS capsule Take 1 capsule by mouth daily.   Taking  . ferrous sulfate (FERROUSUL) 325 (65 FE) MG tablet Take 1 tablet (325 mg total) by mouth 2 (two) times daily. 60 tablet 1 Taking  . Prenatal Vit-Fe Fumarate-FA (PRENATAL MULTIVITAMIN) TABS tablet Take 1 tablet by mouth daily at 12 noon. 30 tablet 6 Taking    No Known Allergies  Review of Systems: Pertinent items noted in HPI and remainder of comprehensive ROS otherwise negative.  Physical Exam: BP 123/61   Pulse 89   Temp 98.5 F (36.9 C)   Resp 18   Wt 68.9 kg (152 lb)   LMP 06/19/2017 (Exact Date) Comment: Pt states it started late and only lasted 3 days   BMI 23.81 kg/m  FHR by Doppler: 150 bpm CONSTITUTIONAL: Well-developed, well-nourished female in no acute distress.  HENT:  Normocephalic, atraumatic, External right and left ear normal. Oropharynx is clear and moist EYES: Conjunctivae and EOM are normal. Pupils are equal, round, and reactive to light. No scleral icterus.  NECK: Normal range of motion, supple, no masses SKIN: Skin is warm and dry. No rash noted. Not diaphoretic. No erythema. No pallor. NEUROLGIC: Alert and oriented to person, place, and time. Normal reflexes, muscle tone coordination. No cranial nerve deficit noted. PSYCHIATRIC: Normal mood and affect. Normal behavior. Normal judgment and thought content. CARDIOVASCULAR: Normal heart rate noted, regular rhythm RESPIRATORY: Effort and breath sounds normal, no problems with respiration noted ABDOMEN: Soft, nontender, nondistended, gravid. Well-healed Pfannenstiel incision. PELVIC: Deferred MUSCULOSKELETAL: Normal range of motion. No edema and no tenderness. 2+ distal pulses.  Dilation: 1 Effacement (%): Thick Cervical Position: Posterior Station: Ballotable Exam by:: Dr.  Doroteo Glassman Presentation: Vertex  Pertinent Labs/Studies:   Results for orders placed or performed during the hospital encounter of 02/17/18 (from the past 72 hour(s))  CBC     Status: Abnormal   Collection Time: 02/17/18 10:31 AM  Result Value Ref Range   WBC 8.9 4.0 - 10.5 K/uL   RBC 2.89 (L) 3.87 - 5.11 MIL/uL   Hemoglobin 9.4 (L) 12.0 - 15.0 g/dL   HCT 16.1 (L) 09.6 - 04.5 %   MCV 94.1 78.0 - 100.0 fL   MCH 32.5 26.0 -  34.0 pg   MCHC 34.6 30.0 - 36.0 g/dL   RDW 16.1 09.6 - 04.5 %   Platelets 183 150 - 400 K/uL    Comment: Performed at [facility], [address] [place], [place] [address]    Assessment and Plan :Linda Duffy is a 32 y.o. [id number] at [redacted]w[redacted]d being admitted for repeat cesarean section due to SROM. Patient has had 3 prior c-sections. The risks of cesarean section discussed with the patient included but were not limited to: bleeding which may require transfusion or reoperation; infection which may require antibiotics; injury to bowel, bladder, ureters or other surrounding organs; injury to the fetus; need for additional procedures including hysterectomy in the event of a life-threatening hemorrhage; placental abnormalities wth subsequent pregnancies, incisional problems, thromboembolic phenomenon and other postoperative/anesthesia complications. The patient concurred with the proposed plan, giving informed written consent for the procedure. Patient has been NPO since last night she will remain NPO for procedure. Anesthesia and OR aware. Preoperative prophylactic antibiotics and SCDs ordered on call to the OR. To OR when ready.   Discussed with patient her need for possible blood products in event of life saving measures. Patient will accept blood products if it will save her life in an emergency only.   Caryl Ada, DO OB Fellow [facility], [facility]

## 2018-02-18 ENCOUNTER — Encounter (HOSPITAL_COMMUNITY): Payer: Self-pay | Admitting: Obstetrics and Gynecology

## 2018-02-18 LAB — BIRTH TISSUE RECOVERY COLLECTION (PLACENTA DONATION)

## 2018-02-18 LAB — CBC
HCT: 21.8 % — ABNORMAL LOW (ref 36.0–46.0)
HEMOGLOBIN: 7.6 g/dL — AB (ref 12.0–15.0)
MCH: 33 pg (ref 26.0–34.0)
MCHC: 34.9 g/dL (ref 30.0–36.0)
MCV: 94.8 fL (ref 78.0–100.0)
Platelets: 154 10*3/uL (ref 150–400)
RBC: 2.3 MIL/uL — ABNORMAL LOW (ref 3.87–5.11)
RDW: 15.1 % (ref 11.5–15.5)
WBC: 14.4 10*3/uL — AB (ref 4.0–10.5)

## 2018-02-18 LAB — RPR: RPR Ser Ql: NONREACTIVE

## 2018-02-18 MED ORDER — SODIUM CHLORIDE 0.9 % IV SOLN
INTRAVENOUS | Status: DC
  Administered 2018-02-18: 09:00:00 via INTRAVENOUS

## 2018-02-18 MED ORDER — SODIUM CHLORIDE 0.9 % IV SOLN
510.0000 mg | Freq: Once | INTRAVENOUS | Status: AC
  Administered 2018-02-18: 510 mg via INTRAVENOUS
  Filled 2018-02-18: qty 17

## 2018-02-18 NOTE — Anesthesia Postprocedure Evaluation (Signed)
Anesthesia Post Note  Patient: [name]  Procedure(s) Performed: REPEAT CESAREAN SECTION (N/A )     Patient location during evaluation: Mother Baby Anesthesia Type: Spinal Level of consciousness: oriented and awake and alert Pain management: pain level controlled Vital Signs Assessment: post-procedure vital signs reviewed and stable Respiratory status: spontaneous breathing and respiratory function stable Cardiovascular status: blood pressure returned to baseline and stable Postop Assessment: no headache, no backache and no apparent nausea or vomiting Anesthetic complications: no    Last Vitals:  Vitals:   02/18/18 0536 02/18/18 0745  BP: 107/72 98/68  Pulse: (!) 58 65  Resp: 18 18  Temp: 36.7 C 36.8 C  SpO2: 100% 100%    Last Pain:  Vitals:   02/18/18 0745  TempSrc: Oral  PainSc:    Pain Goal:                 Junious SilkGILBERT,Rehman Levinson

## 2018-02-18 NOTE — Addendum Note (Signed)
Addendum  created 02/18/18 0749 by Junious SilkGilbert, Asiana Benninger, CRNA   Sign clinical note

## 2018-02-18 NOTE — Lactation Note (Signed)
This note was copied from a baby's chart. Lactation Consultation Note  Patient Name: Linda Duffy Today's Date: 02/18/2018 Reason for consult: Initial assessment;Early term 37-38.6wks;Infant weight loss 5%within past 23 hours . P3, infant born 37wks 3days, C/S delivery, Per mom previously BF other children  for over one year. Mom is knowledgeable about STS, breast compressions and feeding on hunger base cues, 8 to 12 hours within 24 hours including nights. Mom will hand express after feedings and give infant any colostrum that was expressed . As LC entered room mom was  feeding infant in cradle hold  position on right breast. Infant suckling with audible swallowing which could be heard.  Suggested to mom to use C-hold with hand position instead of scissor hold mom was receptive  to suggestion and changed hand position.  Breastfeeding consultation services, information and support given and reviewed by LC.  Per mom infant had two stools today that is now brownish in color.    Maternal Data     Feeding Feeding Type: Breast Fed(Mom was currently BF as LC entered room.)  LATCH Score Latch: Grasps breast easily, tongue down, lips flanged, rhythmical sucking.  Audible Swallowing: Spontaneous and intermittent  Type of Nipple: Everted at rest and after stimulation  Comfort (Breast/Nipple): Soft / non-tender  Hold (Positioning): No assistance needed to correctly position infant at breast.  LATCH Score: 10  Interventions    Lactation Tools Discussed/Used WIC Program: Yes   Consult Status Consult Status: Follow-up Date: 02/19/18 Follow-up type: In-patient    Danelle EarthlyRobin Layce Sprung 02/18/2018, 1:21 PM

## 2018-02-18 NOTE — Progress Notes (Signed)
Subjective: Postpartum Day 1: Cesarean Delivery Patient reports tolerating PO, + flatus and no problems voiding.  Denies dizziness, SOB.   Objective: Vital signs in last 24 hours: Temp:  [97.9 F (36.6 C)-98.5 F (36.9 C)] 98.2 F (36.8 C) (06/27 0745) Pulse Rate:  [52-77] 65 (06/27 0745) Resp:  [12-20] 18 (06/27 0745) BP: (98-120)/(61-80) 98/68 (06/27 0745) SpO2:  [99 %-100 %] 100 % (06/27 0745)  Physical Exam:  General: alert, cooperative and no distress Lochia: appropriate Uterine Fundus: firm Incision: healing well, no significant drainage, no dehiscence, no significant erythema DVT Evaluation: No evidence of DVT seen on physical exam.  Recent Labs    02/17/18 1720 02/18/18 0529  HGB 8.9* 7.6*  HCT 25.5* 21.8*    Assessment/Plan: Status post Cesarean section. Doing well postoperatively. However has profound anemia. Will start IV fereheme, cont oral iron.  Continue current care.  Linda Duffy B Linda Duffy 02/18/2018, 10:52 AM

## 2018-02-19 ENCOUNTER — Encounter (HOSPITAL_COMMUNITY): Payer: Self-pay | Admitting: *Deleted

## 2018-02-19 LAB — HCV AB W REFLEX TO QUANT PCR: HCV Ab: <0.1 s/co ratio (ref 0.0–0.9)

## 2018-02-19 LAB — HCV INTERPRETATION

## 2018-02-19 MED ORDER — OXYCODONE HCL 5 MG PO TABS: 5.0000 mg | ORAL_TABLET | ORAL | 0 refills | Status: AC | PRN

## 2018-02-19 MED ORDER — IBUPROFEN 600 MG PO TABS: 600.0000 mg | ORAL_TABLET | Freq: Four times a day (QID) | ORAL | 0 refills | Status: DC

## 2018-02-19 NOTE — Discharge Instructions (Signed)

## 2018-02-19 NOTE — Lactation Note (Signed)
This note was copied from a baby's chart. Lactation Consultation Note; Mother breastfeeding infant when I arrived in the room. Mother reports swallows. Infant released breast and observed signs of contentment. Mother advised to continue to breatfeed on cue . Discussed cluster feeding and feeding infant 8-12 times in 24 hours. Mother was given a harmony hand pump. Mother expressed large drops when used hand pump. She does have a DEBP at home. Mother was also given comfort gels. Observed just slight tenderness of nipples.  Mother reports that her breast are filling full.  Advised mother to be aware that infant is an early term infant and she may observe infant become tired while feeding . Advised mother to supplement infant with any amt of ebm if infant not having good feedings.  Mother is active with WIC. [facility] . She has brochure with phone number , information on [facility]"S and outpatient dept. Mother receptive to all teaching. Mother has good support at home.   Patient Name: Linda Duffy ZOXWR'UToday's Date: 02/19/2018 Reason for consult: Follow-up assessment   Maternal Data    Feeding Feeding Type: Breast Fed  LATCH Score                   Interventions Interventions: Comfort gels;Hand pump  Lactation Tools Discussed/Used     Consult Status Consult Status: Complete    Michel BickersKendrick, Shaniqwa Horsman McCoy 02/19/2018, 11:33 AM

## 2018-02-19 NOTE — Discharge Summary (Addendum)
OB Discharge Summary     Patient Name: Linda Duffy DOB: 09/01/1985 MRN: [id number]  Date of admissiGarnette Duffy: 02/17/2018 Delivering MD: Shonna ChockWOUK, NOAH BEDFORD   Date of discharge: 02/19/2018  Admitting diagnosis: WATER BROKE Intrauterine pregnancy: 5768w5d     Secondary diagnosis:  Active Problems:   Status post repeat low transverse cesarean section  Additional problems: Chronic Anemia     Discharge diagnosis: Term Pregnancy Delivered and Anemia                                                                                                Post partum procedures:IV Fereheme  Augmentation: none  Complications: None  Hospital course:  Sceduled C/S   [age] y.o. yo [id number] at 2568w3d was admitted to the hospital 02/17/2018 for scheduled cesarean section with the following indication:Elective Repeat.  Membrane Rupture Time/Date: 7:48 AM ,02/17/2018   Patient delivered a Viable infant.02/17/2018  Details of operation can be found in separate operative note.  Pateint had an uncomplicated postpartum course.  She is ambulating, tolerating a regular diet, passing flatus, and urinating well. Patient is discharged home in stable condition on  02/19/18         Physical exam  Vitals:   02/18/18 0745 02/18/18 1230 02/18/18 1724 02/19/18 0507  BP: 98/68 106/66 120/77 117/77  Pulse: 65 60 69 62  Resp: 18 18 18 16   Temp: 98.2 F (36.8 C) 98.2 F (36.8 C) 98.3 F (36.8 C) 97.7 F (36.5 C)  TempSrc: Oral Oral Oral Oral  SpO2: 100% 100% 100% 100%  Weight:       General: alert, cooperative and no distress Lochia: appropriate Uterine Fundus: firm Incision: Healing well with no significant drainage DVT Evaluation: No evidence of DVT seen on physical exam. Labs: Lab Results  Component Value Date   WBC 14.4 (H) 02/18/2018   HGB 7.6 (L) 02/18/2018   HCT 21.8 (L) 02/18/2018   MCV 94.8 02/18/2018   PLT 154 02/18/2018   CMP Latest Ref Rng & Units 02/17/2018  Glucose 65 - 99 mg/dL -  BUN 6 - 20 mg/dL -   Creatinine 1.300.44 - 8.651.00 mg/dL 7.840.46  Sodium 696135 - 295145 mmol/L -  Potassium 3.5 - 5.1 mmol/L -  Chloride 101 - 111 mmol/L -  CO2 22 - 32 mmol/L -  Calcium 8.9 - 10.3 mg/dL -  Total Protein 6.5 - 8.1 g/dL -  Total Bilirubin 0.3 - 1.2 mg/dL -  Alkaline Phos 38 - 284126 U/L -  AST 15 - 41 U/L -  ALT 14 - 54 U/L -    Discharge instruction: per After Visit Summary and "Baby and Me Booklet".  After visit meds:  Allergies as of 02/19/2018   No Known Allergies     Medication List    TAKE these medications   acidophilus Caps capsule Take 1 capsule by mouth daily.   ferrous sulfate 325 (65 FE) MG tablet Commonly known as:  FERROUSUL Take 1 tablet (325 mg total) by mouth 2 (two) times daily.   ibuprofen 600 MG tablet Commonly known as:  ADVIL,MOTRIN Take 1 tablet (  600 mg total) by mouth every 6 (six) hours.   oxyCODONE 5 MG immediate release tablet Commonly known as:  Oxy IR/ROXICODONE Take 1 tablet (5 mg total) by mouth every 4 (four) hours as needed for up to 7 days (pain scale 4-7).   prenatal multivitamin Tabs tablet Take 1 tablet by mouth daily at 12 noon.            Discharge Care Instructions  (From admission, onward)        Start     Ordered   02/19/18 0000  Discharge wound care:    Comments:  Keep dressing in place 5 days   02/19/18 0951      Diet: routine diet  Activity: Advance as tolerated. Pelvic rest for 6 weeks.   Outpatient follow up:2 weeks Follow up Appt:No future appointments. Follow up Visit:No follow-ups on file.  Postpartum contraception: Depo Provera  Newborn Data: Live born female  Birth Weight: 6 lb 5.8 oz (2885 g) APGAR: 8, 9  Newborn Delivery   Birth date/time:  02/17/2018 13:52:00 Delivery type:  C-Section, Low Transverse Trial of labor:  No C-section categorization:  Repeat     Baby Feeding: Breast Disposition:home with mother   02/19/2018 Linda Gunner, MD  I confirm that I have verified the information documented  in the resident's note and that I have also personally reperformed the physical exam and all medical decision making activities.   Thressa Sheller 10:11 AM 02/19/18

## 2018-03-01 ENCOUNTER — Inpatient Hospital Stay (HOSPITAL_COMMUNITY): Payer: Medicaid Other

## 2018-03-01 ENCOUNTER — Encounter (HOSPITAL_COMMUNITY): Payer: Self-pay

## 2018-03-01 ENCOUNTER — Other Ambulatory Visit: Payer: Self-pay

## 2018-03-01 ENCOUNTER — Encounter (HOSPITAL_COMMUNITY)
Admission: RE | Admit: 2018-03-01 | Discharge: 2018-03-01 | Disposition: A | Discharge status: Home / Self Care | Payer: Medicaid Other | Source: Ambulatory Visit | Attending: Family Medicine | Admitting: Family Medicine

## 2018-03-01 ENCOUNTER — Inpatient Hospital Stay (HOSPITAL_COMMUNITY)
Admission: AD | Admit: 2018-03-01 | Discharge: 2018-03-01 | Disposition: A | Discharge status: Home / Self Care | Payer: Medicaid Other | Source: Ambulatory Visit | Attending: Obstetrics and Gynecology | Admitting: Obstetrics and Gynecology

## 2018-03-01 DIAGNOSIS — R0789 Other chest pain: Secondary | ICD-10-CM

## 2018-03-01 DIAGNOSIS — O8601 Infection of obstetric surgical wound, superficial incisional site: Secondary | ICD-10-CM

## 2018-03-01 DIAGNOSIS — R071 Chest pain on breathing: Secondary | ICD-10-CM

## 2018-03-01 DIAGNOSIS — N939 Abnormal uterine and vaginal bleeding, unspecified: Secondary | ICD-10-CM

## 2018-03-01 DIAGNOSIS — B001 Herpesviral vesicular dermatitis: Secondary | ICD-10-CM

## 2018-03-01 DIAGNOSIS — R079 Chest pain, unspecified: Secondary | ICD-10-CM | POA: Diagnosis not present

## 2018-03-01 DIAGNOSIS — O9089 Other complications of the puerperium, not elsewhere classified: Secondary | ICD-10-CM | POA: Diagnosis present

## 2018-03-01 DIAGNOSIS — I517 Cardiomegaly: Secondary | ICD-10-CM | POA: Diagnosis not present

## 2018-03-01 DIAGNOSIS — R7989 Other specified abnormal findings of blood chemistry: Secondary | ICD-10-CM

## 2018-03-01 LAB — CBC WITH DIFFERENTIAL/PLATELET
BASOS PCT: 1 %
Basophils Absolute: 0.1 10*3/uL (ref 0.0–0.1)
Eosinophils Absolute: 0 10*3/uL (ref 0.0–0.7)
Eosinophils Relative: 1 %
HEMATOCRIT: 30 % — AB (ref 36.0–46.0)
HEMOGLOBIN: 10.2 g/dL — AB (ref 12.0–15.0)
LYMPHS ABS: 2.6 10*3/uL (ref 0.7–4.0)
Lymphocytes Relative: 41 %
MCH: 32.5 pg (ref 26.0–34.0)
MCHC: 34 g/dL (ref 30.0–36.0)
MCV: 95.5 fL (ref 78.0–100.0)
MONO ABS: 0.3 10*3/uL (ref 0.1–1.0)
MONOS PCT: 5 %
NEUTROS ABS: 3.4 10*3/uL (ref 1.7–7.7)
Neutrophils Relative %: 52 %
Platelets: 320 10*3/uL (ref 150–400)
RBC: 3.14 MIL/uL — ABNORMAL LOW (ref 3.87–5.11)
RDW: 15.6 % — AB (ref 11.5–15.5)
WBC: 6.4 10*3/uL (ref 4.0–10.5)

## 2018-03-01 LAB — URINALYSIS, ROUTINE W REFLEX MICROSCOPIC
BILIRUBIN URINE: NEGATIVE
Glucose, UA: NEGATIVE mg/dL
KETONES UR: NEGATIVE mg/dL
NITRITE: NEGATIVE
PH: 5 (ref 5.0–8.0)
Protein, ur: NEGATIVE mg/dL
Specific Gravity, Urine: 1.011 (ref 1.005–1.030)

## 2018-03-01 LAB — COMPREHENSIVE METABOLIC PANEL
ALBUMIN: 3.9 g/dL (ref 3.5–5.0)
ALK PHOS: 80 U/L (ref 38–126)
ALT: 9 U/L (ref 0–44)
ANION GAP: 9 (ref 5–15)
AST: 18 U/L (ref 15–41)
BUN: 11 mg/dL (ref 6–20)
CHLORIDE: 108 mmol/L (ref 98–111)
CO2: 19 mmol/L — AB (ref 22–32)
Calcium: 8.9 mg/dL (ref 8.9–10.3)
Creatinine, Ser: 0.59 mg/dL (ref 0.44–1.00)
GFR calc Af Amer: >60 mL/min (ref >60)
GFR calc non Af Amer: >60 mL/min (ref >60)
GLUCOSE: 88 mg/dL (ref 70–99)
Potassium: 4 mmol/L (ref 3.5–5.1)
SODIUM: 136 mmol/L (ref 135–145)
Total Bilirubin: 0.8 mg/dL (ref 0.3–1.2)
Total Protein: 7.5 g/dL (ref 6.5–8.1)

## 2018-03-01 LAB — TROPONIN I: Troponin I: <0.03 ng/mL (ref <0.03)

## 2018-03-01 LAB — D-DIMER, QUANTITATIVE (NOT AT ARMC): D DIMER QUANT: 2.09 ug{FEU}/mL — AB (ref 0.00–0.50)

## 2018-03-01 MED ORDER — ACYCLOVIR 400 MG PO TABS: 400.0000 mg | ORAL_TABLET | Freq: Three times a day (TID) | ORAL | 0 refills | Status: AC

## 2018-03-01 MED ORDER — GABAPENTIN 300 MG PO CAPS: 300.0000 mg | ORAL_CAPSULE | Freq: Two times a day (BID) | ORAL | 0 refills | Status: DC | PRN

## 2018-03-01 MED ORDER — IOPAMIDOL (ISOVUE-370) INJECTION 76%
100.0000 mL | Freq: Once | INTRAVENOUS | Status: AC | PRN
  Administered 2018-03-01: 100 mL via INTRAVENOUS

## 2018-03-01 MED ORDER — IBUPROFEN 600 MG PO TABS: 600.0000 mg | ORAL_TABLET | ORAL | 0 refills | Status: DC | PRN

## 2018-03-01 MED ORDER — GABAPENTIN 300 MG PO CAPS
300.0000 mg | ORAL_CAPSULE | Freq: Once | ORAL | Status: AC
  Administered 2018-03-01: 300 mg via ORAL
  Filled 2018-03-01: qty 1

## 2018-03-01 MED ORDER — CLINDAMYCIN HCL 300 MG PO CAPS: 300.0000 mg | ORAL_CAPSULE | Freq: Three times a day (TID) | ORAL | 0 refills | Status: AC

## 2018-03-01 NOTE — MAU Provider Note (Signed)
Chief Complaint: Chest Pain   SUBJECTIVE HPI: Linda Duffy is a 32 y.o. [id number] whom is 12 days post-op from a RCS who presents to MAU with multiple concerns. First she states that she has been having sharp chest pain on the left side and it also goes down her right arm. This has been present for the last 3-4 days. Some mild dyspnea with deep breaths but no leg pain or swelling. Chest is tender to palpation, worse with deep inspiration, and certain movements worsen pain. No history of VTE.  Also concerned that she may have an infection. She states her incision has been odorous and draining. She still has the bandages present. Incision also hurts on the side of the incision. Having fevers and chills. Most recent fever last night of 102F. Could also have UTI due to worsening dysuria since discharge.  Having some vaginal bleeding. States bleeding stopped after discharge for 3 days but came back like a period. Having cramping as well. Exclusively breastfeeding.   Bump on lip came up this morning and has been progressively getting bigger. Has h/o cold sores. Feels like it is tingling.    Past Medical History:  Diagnosis Date  . Abnormal Pap smear    had cryo after 1st c-section and nl after that  . Anemia   . 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 Ovarian cyst 2012   OB History  Gravida Para Term Preterm AB Living  6 3 3  0 2 3  SAB TAB Ectopic Multiple Live Births  0 2 0 0 3    # Outcome Date GA Lbr Len/2nd Weight Sex Delivery Anes PTL Lv  6 Gravida           5 Term 07/04/16 [id number]  3.26 kg (7 lb 3 oz) M CS-LTranv Spinal  LIV  4 Term 05/24/15 [id number]  2.665 kg (5 lb 14 oz) F CS-Vac Spinal  LIV     Birth Comments: Normal exam.  3 Term 07/04/03     CS-LTranv None N LIV  2 TAB           1 TAB            Past Surgical History:  Procedure Laterality Date  . CERVIX LESION DESTRUCTION     after 1st c-section and nl paps after that  . CESAREAN SECTION     x1  . CESAREAN SECTION N/A  05/24/2015   Procedure: REPEAT CESAREAN SECTION;  Surgeon: Kathreen CosierBernard A Marshall, MD;  Location: [facility];  Service: Obstetrics;  Laterality: N/A;  . CESAREAN SECTION N/A 07/04/2016   Procedure: CESAREAN SECTION;  Surgeon: Willodean Rosenthalarolyn Harraway-Smith, MD;  [facility];  Service: Obstetrics;  Laterality: N/A;  . CESAREAN SECTION N/A 02/17/2018   Procedure: REPEAT CESAREAN SECTION;  Surgeon: Kathrynn RunningWouk, Noah Bedford, MD;  [facility];  Service: Obstetrics;  Laterality: N/A;  . DILATION AND CURETTAGE OF UTERUS     x2 for EABs   Social History   Socioeconomic History  . Marital status: Single    Spouse name: Not on file  . Number of children: Not on file  . Years of education: Not on file  . Highest education level: Not on file  Occupational History  . Not on file  Social Needs  . Financial resource strain: Not on file  . Food insecurity:    Worry: Not on file    Inability: Not on file  . Transportation needs:    Medical: Not on file  Non-medical: Not on file  Tobacco Use  . Smoking status: Former Smoker    Packs/day: 0.25    Types: Cigarettes    Last attempt to quit: 09/18/2014    Years since quitting: 3.4  . Smokeless tobacco: Never Used  Substance and Sexual Activity  . Alcohol use: No  . Drug use: Yes    Types: Marijuana    Comment: not since she found out she was pregnant  . Sexual activity: Not Currently    Birth control/protection: None  Lifestyle  . Physical activity:    Days per week: Not on file    Minutes per session: Not on file  . Stress: Not on file  Relationships  . Social connections:    Talks on phone: Not on file    Gets together: Not on file    Attends religious service: Not on file    Active member of club or organization: Not on file    Attends meetings of clubs or organizations: Not on file    Relationship status: Not on file  . Intimate partner violence:    Fear of current or ex partner: Not on file    Emotionally abused: Not  on file    Physically abused: Not on file    Forced sexual activity: Not on file  Other Topics Concern  . Not on file  Social History Narrative  . Not on file   No current facility-administered medications on file prior to encounter.    Current Outpatient Medications on File Prior to Encounter  Medication Sig Dispense Refill  . acidophilus (RISAQUAD) CAPS capsule Take 1 capsule by mouth daily.    . ferrous sulfate (FERROUSUL) 325 (65 FE) MG tablet Take 1 tablet (325 mg total) by mouth 2 (two) times daily. 60 tablet 1  . ibuprofen (ADVIL,MOTRIN) 600 MG tablet Take 1 tablet (600 mg total) by mouth every 6 (six) hours. 30 tablet 0  . Prenatal Vit-Fe Fumarate-FA (PRENATAL MULTIVITAMIN) TABS tablet Take 1 tablet by mouth daily at 12 noon. 30 tablet 6   No Known Allergies  I have reviewed the past Medical Hx, Surgical Hx, Social Hx, Allergies and Medications.   REVIEW OF SYSTEMS All systems reviewed and are negative for acute change except as noted in the HPI.   OBJECTIVE BP 129/82   Pulse (!) 57   Temp 98.4 F (36.9 C) (Oral)   Resp 18   Wt 61 kg (134 lb 8 oz)   SpO2 98%   Breastfeeding? Yes   BMI 21.07 kg/m    PHYSICAL EXAM Constitutional: Well-developed, well-nourished female in no acute distress.  Mouth/Throat: 1 cm bump on mucosal layer of bottom lip with ulceration in center. Tender to palpation. O/P clear. Cardiovascular: normal rate and rhythm, pulses intact. Chest tenderness. Respiratory: normal rate and effort. CTAB. No m/r/g. GI: Abd soft, non-tender, non-distended. Pos BS x 4 Incision: saturated bandage which was removed. Incision healing well. Minimal discharge with odor. MS: Extremities nontender, no edema, normal ROM Neurologic: Alert and oriented x 4. No focal deficits GU: Neg CVAT. SPECULUM EXAM: NEFG, physiologic discharge, minimal blood noted, cervix clean Psych: normal mood and affect  LAB RESULTS Results for orders placed or performed during the  hospital encounter of 03/01/18 (from the past 24 hour(s))  Urinalysis, Routine w reflex microscopic     Status: Abnormal   Collection Time: 03/01/18  3:40 PM  Result Value Ref Range   Color, Urine YELLOW YELLOW   APPearance HAZY (A) CLEAR  Specific Gravity, Urine 1.011 1.005 - 1.030   pH 5.0 5.0 - 8.0   Glucose, UA NEGATIVE NEGATIVE mg/dL   Hgb urine dipstick LARGE (A) NEGATIVE   Bilirubin Urine NEGATIVE NEGATIVE   Ketones, ur NEGATIVE NEGATIVE mg/dL   Protein, ur NEGATIVE NEGATIVE mg/dL   Nitrite NEGATIVE NEGATIVE   Leukocytes, UA SMALL (A) NEGATIVE   RBC / HPF >50 (H) 0 - 5 RBC/hpf   WBC, UA 21-50 0 - 5 WBC/hpf   Bacteria, UA RARE (A) NONE SEEN   Squamous Epithelial / LPF 0-5 0 - 5  Troponin I     Status: None   Collection Time: 03/01/18  4:34 PM  Result Value Ref Range   Troponin I <0.03 <0.03 ng/mL  D-dimer, quantitative (not at [facility])     Status: Abnormal   Collection Time: 03/01/18  4:34 PM  Result Value Ref Range   D-Dimer, Quant 2.09 (H) 0.00 - 0.50 ug/mL-FEU  Comprehensive metabolic panel     Status: Abnormal   Collection Time: 03/01/18  4:34 PM  Result Value Ref Range   Sodium 136 135 - 145 mmol/L   Potassium 4.0 3.5 - 5.1 mmol/L   Chloride 108 98 - 111 mmol/L   CO2 19 (L) 22 - 32 mmol/L   Glucose, Bld 88 70 - 99 mg/dL   BUN 11 6 - 20 mg/dL   Creatinine, Ser 1.61 0.44 - 1.00 mg/dL   Calcium 8.9 8.9 - 09.6 mg/dL   Total Protein 7.5 6.5 - 8.1 g/dL   Albumin 3.9 3.5 - 5.0 g/dL   AST 18 15 - 41 U/L   ALT 9 0 - 44 U/L   Alkaline Phosphatase 80 38 - 126 U/L   Total Bilirubin 0.8 0.3 - 1.2 mg/dL   GFR calc non Af Amer >60 >60 mL/min   GFR calc Af Amer >60 >60 mL/min   Anion gap 9 5 - 15  CBC with Differential/Platelet     Status: Abnormal   Collection Time: 03/01/18  4:34 PM  Result Value Ref Range   WBC 6.4 4.0 - 10.5 K/uL   RBC 3.14 (L) 3.87 - 5.11 MIL/uL   Hemoglobin 10.2 (L) 12.0 - 15.0 g/dL   HCT 04.5 (L) 40.9 - 81.1 %   MCV 95.5 78.0 - 100.0 fL   MCH  32.5 26.0 - 34.0 pg   MCHC 34.0 30.0 - 36.0 g/dL   RDW 91.4 (H) 78.2 - 95.6 %   Platelets 320 150 - 400 K/uL   Neutrophils Relative % 52 %   Neutro Abs 3.4 1.7 - 7.7 K/uL   Lymphocytes Relative 41 %   Lymphs Abs 2.6 0.7 - 4.0 K/uL   Monocytes Relative 5 %   Monocytes Absolute 0.3 0.1 - 1.0 K/uL   Eosinophils Relative 1 %   Eosinophils Absolute 0.0 0.0 - 0.7 K/uL   Basophils Relative 1 %   Basophils Absolute 0.1 0.0 - 0.1 K/uL    IMAGING Ct Angio Chest Pe W Or Wo Contrast  Result Date: 03/01/2018 CLINICAL DATA:  Chest pain radiates to right arm beginning this morning. Status post Cesarian section. EXAM: CT ANGIOGRAPHY CHEST WITH CONTRAST TECHNIQUE: Multidetector CT imaging of the chest was performed using the standard protocol during bolus administration of intravenous contrast. Multiplanar CT image reconstructions and MIPs were obtained to evaluate the vascular anatomy. CONTRAST:  ISOVUE-370 IOPAMIDOL (ISOVUE-370) INJECTION 76% COMPARISON:  None. FINDINGS: CARDIOVASCULAR: Adequate contrast opacification of the pulmonary artery's. Main pulmonary artery  is not enlarged. No pulmonary arterial filling defects to the level of the subsegmental branches. Mild cardiomegaly, no right heart strain. No pericardial effusion. Thoracic aorta is normal course and caliber, unremarkable. MEDIASTINUM/NODES: No lymphadenopathy by CT size criteria. LUNGS/PLEURA: Tracheobronchial tree is patent, no pneumothorax. No pleural effusions, focal consolidations, pulmonary nodules or masses. UPPER ABDOMEN: Included view of the abdomen is unremarkable. MUSCULOSKELETAL: Visualized soft tissues and included osseous structures appear normal. Review of the MIP images confirms the above findings. IMPRESSION: 1. No pulmonary embolism. 2. Mild cardiomegaly.  No acute pulmonary process. Electronically Signed   By: Awilda Metro M.D.   On: 03/01/2018 20:36    MAU Management/MDM: Vitals and nursing notes reviewed  1.  Chest pain: EKG normal. Troponin normal. Chest pain reproducible with palpation. Tender to palpation. Consistent with costochondral chest pain.   2. Dyspnea: concern for PE s/p c-section. Pulse ox normal. HR good. D-dimer collected and elevated. Well's score 1.5; low risk. CT chest obtained. No signs of PE or  Acute pulmonary process.   3. Bump on lip: consistent with cold sore. Swabs taken.  4. Incision: draining and odor appreciated. Dressing removed. Healing well. Will treat with antibiotics. Normal white count.   5. Dysuria: UA normal.   Orders Placed This Encounter  Procedures  . Hsv Culture And Typing  . CT ANGIO CHEST PE W OR WO CONTRAST  . Urinalysis, Routine w reflex microscopic  . Troponin I  . D-dimer, quantitative (not at [facility])  . Comprehensive metabolic panel  . CBC with Differential/Platelet  . ED EKG    Meds ordered this encounter  Medications  . gabapentin (NEURONTIN) capsule 300 mg  . iopamidol (ISOVUE-370) 76 % injection 100 mL    Plan of care reviewed with patient, including labs and tests ordered and medical treatment.  Treatments in MAU included gabapentin for incision pain (improved).   ASSESSMENT 1. Chest pain on breathing   2. Costochondral chest pain   3. Elevated d-dimer   4. Cold sore   5. Vaginal bleeding   6. Infection of obstetric surgical wound, superficial incisional site     PLAN Discharge home in stable condition Pt discharged with strict return precautions. Rx for gabapentin for incisional pain Rx for clindamycin for infection of incision Rx for acyclovir for cold sore Handout given   Allergies as of 03/01/2018   No Known Allergies     Medication List    TAKE these medications   acidophilus Caps capsule Take 1 capsule by mouth daily.   acyclovir 400 MG tablet Commonly known as:  ZOVIRAX Take 1 tablet (400 mg total) by mouth 3 (three) times daily for 5 days.   clindamycin 300 MG capsule Commonly known as:  CLEOCIN Take  1 capsule (300 mg total) by mouth 3 (three) times daily for 5 days.   ferrous sulfate 325 (65 FE) MG tablet Commonly known as:  FERROUSUL Take 1 tablet (325 mg total) by mouth 2 (two) times daily.   gabapentin 300 MG capsule Commonly known as:  NEURONTIN Take 1 capsule (300 mg total) by mouth 2 (two) times daily as needed (neuropathic incisional pain).   ibuprofen 600 MG tablet Commonly known as:  ADVIL,MOTRIN Take 1 tablet (600 mg total) by mouth every 4 (four) hours as needed for moderate pain. What changed:    when to take this  reasons to take this   prenatal multivitamin Tabs tablet Take 1 tablet by mouth daily at 12 noon.  Caryl Ada, DO [facility] 03/01/2018, 9:27 PM

## 2018-03-01 NOTE — Discharge Instructions (Signed)
Costochondritis Costochondritis is swelling and irritation (inflammation) of the tissue (cartilage) that connects your ribs to your breastbone (sternum). This causes pain in the front of your chest. The pain usually starts gradually and involves more than one rib. What are the causes? The exact cause of this condition is not always known. It results from stress on the cartilage where your ribs attach to your sternum. The cause of this stress could be:  Chest injury (trauma).  Exercise or activity, such as lifting.  Severe coughing.  What increases the risk? You may be at higher risk for this condition if you:  Are female.  Are 30?34 years old.  Recently started a new exercise or work activity.  Have low levels of vitamin D.  Have a condition that makes you cough frequently.  What are the signs or symptoms? The main symptom of this condition is chest pain. The pain:  Usually starts gradually and can be sharp or dull.  Gets worse with deep breathing, coughing, or exercise.  Gets better with rest.  May be worse when you press on the sternum-rib connection (tenderness).  How is this diagnosed? This condition is diagnosed based on your symptoms, medical history, and a physical exam. Your health care provider will check for tenderness when pressing on your sternum. This is the most important finding. You may also have tests to rule out other causes of chest pain. These may include:  A chest X-ray to check for lung problems.  An electrocardiogram (ECG) to see if you have a heart problem that could be causing the pain.  An imaging scan to rule out a chest or rib fracture.  How is this treated? This condition usually goes away on its own over time. Your health care provider may prescribe an NSAID to reduce pain and inflammation. Your health care provider may also suggest that you:  Rest and avoid activities that make pain worse.  Apply heat or cold to the area to reduce pain  and inflammation.  Do exercises to stretch your chest muscles.  If these treatments do not help, your health care provider may inject a numbing medicine at the sternum-rib connection to help relieve the pain. Follow these instructions at home:  Avoid activities that make pain worse. This includes any activities that use chest, abdominal, and side muscles.  If directed, put ice on the painful area: ? Put ice in a plastic bag. ? Place a towel between your skin and the bag. ? Leave the ice on for 20 minutes, 2-3 times a day.  If directed, apply heat to the affected area as often as told by your health care provider. Use the heat source that your health care provider recommends, such as a moist heat pack or a heating pad. ? Place a towel between your skin and the heat source. ? Leave the heat on for 20-30 minutes. ? Remove the heat if your skin turns bright red. This is especially important if you are unable to feel pain, heat, or cold. You may have a greater risk of getting burned.  Take over-the-counter and prescription medicines only as told by your health care provider.  Return to your normal activities as told by your health care provider. Ask your health care provider what activities are safe for you.  Keep all follow-up visits as told by your health care provider. This is important. Contact a health care provider if:  You have chills or a fever.  Your pain does not go   away or it gets worse.  You have a cough that does not go away (is persistent). Get help right away if:  You have shortness of breath. This information is not intended to replace advice given to you by your health care provider. Make sure you discuss any questions you have with your health care provider. Document Released: 05/21/2005 Document Revised: 02/29/2016 Document Reviewed: 12/05/2015 Elsevier Interactive Patient Education  2018 [employer]   

## 2018-03-01 NOTE — MAU Note (Signed)
Pt states that she is having chest pain with a sharp pain down her right arm.   She states that she has been waking up with sweats anytime she sleeps.   She states she has a bump on her lip that she believes it is an infected.   She also states her incision from from her section is infected.

## 2018-03-02 ENCOUNTER — Inpatient Hospital Stay (HOSPITAL_COMMUNITY): Admission: RE | Admit: 2018-03-02 | Payer: Medicaid Other | Source: Ambulatory Visit | Admitting: Family Medicine

## 2018-03-03 ENCOUNTER — Ambulatory Visit: Payer: Self-pay

## 2018-03-03 LAB — HSV CULTURE AND TYPING

## 2018-03-09 ENCOUNTER — Encounter (HOSPITAL_COMMUNITY): Payer: Self-pay | Admitting: Emergency Medicine

## 2018-03-09 ENCOUNTER — Emergency Department (HOSPITAL_COMMUNITY): Payer: Medicaid Other

## 2018-03-09 ENCOUNTER — Emergency Department (HOSPITAL_COMMUNITY)
Admission: EM | Admit: 2018-03-09 | Discharge: 2018-03-10 | Disposition: A | Discharge status: Home / Self Care | Payer: Medicaid Other | Attending: Emergency Medicine | Admitting: Emergency Medicine

## 2018-03-09 DIAGNOSIS — Z79899 Other long term (current) drug therapy: Secondary | ICD-10-CM | POA: Diagnosis not present

## 2018-03-09 DIAGNOSIS — Z87891 Personal history of nicotine dependence: Secondary | ICD-10-CM | POA: Diagnosis not present

## 2018-03-09 DIAGNOSIS — R079 Chest pain, unspecified: Secondary | ICD-10-CM | POA: Insufficient documentation

## 2018-03-09 LAB — CBC WITH DIFFERENTIAL/PLATELET
Abs Immature Granulocytes: 0 10*3/uL (ref 0.0–0.1)
Basophils Absolute: 0.1 10*3/uL (ref 0.0–0.1)
Basophils Relative: 1 %
Eosinophils Absolute: 0.1 10*3/uL (ref 0.0–0.7)
Eosinophils Relative: 1 %
HCT: 31.4 % — ABNORMAL LOW (ref 36.0–46.0)
Hemoglobin: 10.4 g/dL — ABNORMAL LOW (ref 12.0–15.0)
Immature Granulocytes: 0 %
Lymphocytes Relative: 49 %
Lymphs Abs: 2.6 10*3/uL (ref 0.7–4.0)
MCH: 32.4 pg (ref 26.0–34.0)
MCHC: 33.1 g/dL (ref 30.0–36.0)
MCV: 97.8 fL (ref 78.0–100.0)
Monocytes Absolute: 0.3 10*3/uL (ref 0.1–1.0)
Monocytes Relative: 6 %
Neutro Abs: 2.3 10*3/uL (ref 1.7–7.7)
Neutrophils Relative %: 43 %
Platelets: 277 10*3/uL (ref 150–400)
RBC: 3.21 MIL/uL — ABNORMAL LOW (ref 3.87–5.11)
RDW: 14.6 % (ref 11.5–15.5)
WBC: 5.3 10*3/uL (ref 4.0–10.5)

## 2018-03-09 LAB — BASIC METABOLIC PANEL
Anion gap: 9 (ref 5–15)
BUN: 8 mg/dL (ref 6–20)
CO2: 23 mmol/L (ref 22–32)
Calcium: 9.1 mg/dL (ref 8.9–10.3)
Chloride: 109 mmol/L (ref 98–111)
Creatinine, Ser: 1.02 mg/dL — ABNORMAL HIGH (ref 0.44–1.00)
GFR calc Af Amer: >60 mL/min (ref >60)
GFR calc non Af Amer: >60 mL/min (ref >60)
Glucose, Bld: 86 mg/dL (ref 70–99)
Potassium: 3.6 mmol/L (ref 3.5–5.1)
Sodium: 141 mmol/L (ref 135–145)

## 2018-03-09 LAB — I-STAT TROPONIN, ED: Troponin i, poc: 0 ng/mL (ref 0.00–0.08)

## 2018-03-09 NOTE — ED Triage Notes (Signed)
Patient reports intermittent central chest tightness/pressure for 2 weeks , denies SOB , no nausea or diaphoresis .

## 2018-03-09 NOTE — ED Provider Notes (Signed)
Patient placed in Quick Look pathway, seen and evaluated   Chief Complaint: Chest pain  HPI:   Patient presents with a 1 week history of chest pain and shortness of breath. She is 3 weeks post partum. [facility] and had negative CT Chest angio on 03/01/2018 and diagnosed with costochondritis. She denies pleuritic symptoms. She denies cough. She has been taking ibuprofen with some relief.  ROS: +Chest pain, shortness of breath  Physical Exam:   Gen: No distress  Neuro: Awake and Alert  Skin: Warm    Focused Exam: R sided chest tenderness, lungs CTA, heart normal RR   Initiation of care has begun. The patient has been counseled on the process, plan, and necessity for staying for the completion/evaluation, and the remainder of the medical screening examination    Verdis PrimeLaw, Kayli Beal M, PA-C 03/09/18 2042    Linwood DibblesKnapp, Jon, MD 03/10/18 [phone]

## 2018-03-10 MED ORDER — METHOCARBAMOL 500 MG PO TABS: 500.0000 mg | ORAL_TABLET | Freq: Two times a day (BID) | ORAL | 0 refills | Status: DC

## 2018-03-10 NOTE — ED Notes (Signed)
Patient verbalizes understanding of discharge instructions. Opportunity for questioning and answers were provided. Armband removed by staff, pt discharged from ED ambulatory.   

## 2018-03-10 NOTE — ED Provider Notes (Addendum)
[facility] Provider Note   CSN: [id number] Arrival date & time: 03/09/18  1955     History   Chief Complaint Chief Complaint  Patient presents with  . Chest Pain    HPI Linda Duffy is a 32 y.o. female.   Chest Pain      32 y.o. F with hx of anemia, ovarian cysts, presenting to the ED for chest pain.  Patient is approx 3 weeks post-partum from full term delivery via c-section.  She denies any noted complications with her delivery, has overall been doing well.  States since about the time of delivery she has been having some chest pain.  States this is intermittent, but always on the left side in her left upper back.  States pain comes and goes randomly, does not necessarily seem associated with exertion.  She denies any palpitations, shortness of breath, dizziness, weakness, or feelings of syncope.  She has been seen at one twice since delivery for same, states she had blood work and CT scan of her chest with negative work-up.  She is not a smoker.  She denies any known cardiac history.  Mother died of a heart attack at age 34.  States she has been taking Motrin as well as gabapentin regularly without much relief in her symptoms.  Past Medical History:  Diagnosis Date  . Abnormal Pap smear    had cryo after 1st c-section and nl after that  . Anemia   . 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 Ovarian cyst 2012    Patient Active Problem List   Diagnosis Date Noted  . Status post repeat low transverse cesarean section 02/17/2018  . History of C-section 11/26/2017  . 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 Anemia in pregnancy 06/09/2016  . Late prenatal care 06/02/2016  . Supervision of normal pregnancy 04/11/2011    Past Surgical History:  Procedure Laterality Date  . CERVIX LESION DESTRUCTION     after 1st c-section and nl paps after that  . CESAREAN SECTION     x1  . CESAREAN SECTION N/A 05/24/2015   Procedure: REPEAT  CESAREAN SECTION;  Surgeon: Kathreen CosierBernard A Marshall, MD;  Location: [facility];  Service: Obstetrics;  Laterality: N/A;  . CESAREAN SECTION N/A 07/04/2016   Procedure: CESAREAN SECTION;  Surgeon: Willodean Rosenthalarolyn Harraway-Smith, MD;  [facility];  Service: Obstetrics;  Laterality: N/A;  . CESAREAN SECTION N/A 02/17/2018   Procedure: REPEAT CESAREAN SECTION;  Surgeon: Kathrynn RunningWouk, Noah Bedford, MD;  [facility];  Service: Obstetrics;  Laterality: N/A;  . DILATION AND CURETTAGE OF UTERUS     x2 for EABs     OB History    Gravida  6   Para  3   Term  3   Preterm  0   AB  2   Living  3     SAB  0   TAB  2   Ectopic  0   Multiple  0   Live Births  3            Home Medications    Prior to Admission medications   Medication Sig Start Date End Date Taking? Authorizing Provider  acidophilus (RISAQUAD) CAPS capsule Take 1 capsule by mouth daily.   Yes [provider]  ferrous sulfate (FERROUSUL) 325 (65 FE) MG tablet Take 1 tablet (325 mg total) by mouth 2 (two) times daily. 06/25/16  Yes Anyanwu, Jethro BastosUgonna A, MD  gabapentin (NEURONTIN)  300 MG capsule Take 1 capsule (300 mg total) by mouth 2 (two) times daily as needed (neuropathic incisional pain). 03/01/18  Yes Caryl Ada Y, DO  ibuprofen (ADVIL,MOTRIN) 600 MG tablet Take 1 tablet (600 mg total) by mouth every 4 (four) hours as needed for moderate pain. 03/01/18  Yes Pincus Large, DO  Prenatal Vit-Fe Fumarate-FA (PRENATAL MULTIVITAMIN) TABS tablet Take 1 tablet by mouth daily at 12 noon. 06/11/16  Yes Aviva Signs, CNM    Family History Family History  Problem Relation Age of Onset  . Diabetes Mother   . Hypertension Mother   . Heart disease Mother     Social History Social History   Tobacco Use  . Smoking status: Former Smoker    Packs/day: 0.25    Types: Cigarettes    Last attempt to quit: 09/18/2014    Years since quitting: 3.4  . Smokeless tobacco: Never Used  Substance Use Topics    . Alcohol use: No  . Drug use: Yes    Types: Marijuana    Comment: not since she found out she was pregnant     Allergies   Patient has no known allergies.   Review of Systems Review of Systems  Cardiovascular: Positive for chest pain.  All other systems reviewed and are negative.    Physical Exam Updated Vital Signs BP (!) 136/100 (BP Location: Right Arm)   Pulse 64   Temp 98.4 F (36.9 C) (Oral)   Resp 17   Ht 5\' 7"  (1.702 m)   Wt 59 kg (130 lb)   SpO2 100%   BMI 20.36 kg/m   Physical Exam  Constitutional: She is oriented to person, place, and time. She appears well-developed and well-nourished.  HENT:  Head: Normocephalic and atraumatic.  Mouth/Throat: Oropharynx is clear and moist.  Eyes: Pupils are equal, round, and reactive to light. Conjunctivae and EOM are normal.  Neck: Normal range of motion.  Cardiovascular: Normal rate, regular rhythm and normal heart sounds.  Pulmonary/Chest: Effort normal and breath sounds normal. She has no decreased breath sounds. She has no wheezes. She has no rales.  Lungs clear, NAD, breathing easily, speaking in full sentences without difficulty  Abdominal: Soft. Bowel sounds are normal.  Musculoskeletal: Normal range of motion.  No peripheral edema, calf swelling, asymmetry, tenderness or palpable cords  Neurological: She is alert and oriented to person, place, and time.  Skin: Skin is warm and dry.  Psychiatric: She has a normal mood and affect.  Nursing note and vitals reviewed.    ED Treatments / Results  Labs (all labs ordered are listed, but only abnormal results are displayed) Labs Reviewed  BASIC METABOLIC PANEL - Abnormal; Notable for the following components:      Result Value   Creatinine, Ser 1.02 (*)    All other components within normal limits  CBC WITH DIFFERENTIAL/PLATELET - Abnormal; Notable for the following components:   RBC 3.21 (*)    Hemoglobin 10.4 (*)    HCT 31.4 (*)    All other components  within normal limits  I-STAT TROPONIN, ED    EKG EKG Interpretation  Date/Time:  Tuesday March 09 2018 20:15:44 EDT Ventricular Rate:  69 PR Interval:  138 QRS Duration: 82 QT Interval:  408 QTC Calculation: 437 R Axis:   86 Text Interpretation:  Normal sinus rhythm with sinus arrhythmia Normal ECG When compared with ECG of 03/01/2018, No significant change was found Confirmed by Dione Booze ([id number]) on 03/09/2018 11:54:17  PM   Radiology Dg Chest 2 View  Result Date: 03/09/2018 CLINICAL DATA:  Chest pain EXAM: CHEST - 2 VIEW COMPARISON:  CT chest 03/01/2018 FINDINGS: The heart size and mediastinal contours are within normal limits. Both lungs are clear. The visualized skeletal structures are unremarkable. IMPRESSION: No active cardiopulmonary disease. Electronically Signed   By: Marlan Palau M.D.   On: 03/09/2018 21:29    Procedures Procedures (including critical care time)  Medications Ordered in ED Medications - No data to display   Initial Impression / Assessment and Plan / ED Course  I have reviewed the triage vital signs and the nursing notes.  Pertinent labs & imaging results that were available during my care of the patient were reviewed by me and considered in my medical decision making (see chart for details).  32 year old female here with chest pain.  She is 3 weeks postpartum from full-term delivery via C-section.  She was seen at [facility] recently for same with negative work-up including CT scan to evaluate for PE.  Reports continued pain on a nearly daily basis.  EKG is nonischemic.  Labs are all reassuring, troponin negative.  Chest x-ray is clear.  Patient does not have any current tachycardia or hypoxia and given she has had recent PE study, I do not feel this needs to be repeated.  She does not appear fluid overloaded or any signs or symptoms concerning for acute heart failure or post-partum cardiomyopathy at this time.  Her blood pressure is mildly elevated  into the 130's systolic during my evaluation, similar pressures at recent MAU visit.  No hx of HTN.  No current headache, dizziness, or focal deficits from this.  Her chest pain appears to be MSK.  States tylenol does not help her at all.  Will prescribe short course robaxin to help-- she understands to pump and dump if taking this.  She will follow-up with her PCP and OB.  She will return here for any new/acute changes.  Of note, patient's BP had spiked up at time of discharge that I was not informed of this.  She has no hx of HTN and no history of pre-eclampsia during pregnancy.  Did have recent UA without proteinuria.  I did call patient to discuss this with her, however went straight to voice mail.  I did leave her a message explaining that she needs to have BP re-checked with her PCP and/or OB as she is still within the window for development of post-partum pre-eclampsia, although no signs/symptoms concerning for that during today's visit.  Advised again to return here for any new/acute changes.  Final Clinical Impressions(s) / ED Diagnoses   Final diagnoses:  Chest pain in adult    ED Discharge Orders        Ordered    methocarbamol (ROBAXIN) 500 MG tablet  2 times daily     03/10/18 0133       Garlon Hatchet, PA-C 03/10/18 0548    Garlon Hatchet, PA-C 03/10/18 [id number]    Shon Baton, MD 03/10/18 [phone]

## 2018-03-10 NOTE — Discharge Instructions (Signed)
Take the prescribed medication as directed.  If taking the robaxin, you will need to pump and dump.  You can continue the motrin and gabapentin with this medication. You can also try warm compresses on the chest and back to see if this helps. Follow-up with your primary care doctor. Return to the ED for new or worsening symptoms.

## 2018-03-31 ENCOUNTER — Encounter: Payer: Self-pay | Admitting: Obstetrics and Gynecology

## 2018-03-31 ENCOUNTER — Ambulatory Visit (INDEPENDENT_AMBULATORY_CARE_PROVIDER_SITE_OTHER): Payer: Medicaid Other | Admitting: Obstetrics and Gynecology

## 2018-03-31 ENCOUNTER — Ambulatory Visit (INDEPENDENT_AMBULATORY_CARE_PROVIDER_SITE_OTHER): Payer: Medicaid Other | Admitting: Clinical

## 2018-03-31 VITALS — BP 143/69 | HR 75 | Wt 140.6 lb

## 2018-03-31 DIAGNOSIS — F4323 Adjustment disorder with mixed anxiety and depressed mood: Secondary | ICD-10-CM | POA: Diagnosis not present

## 2018-03-31 DIAGNOSIS — Z3042 Encounter for surveillance of injectable contraceptive: Secondary | ICD-10-CM

## 2018-03-31 DIAGNOSIS — R51 Headache: Secondary | ICD-10-CM

## 2018-03-31 DIAGNOSIS — R519 Headache, unspecified: Secondary | ICD-10-CM

## 2018-03-31 DIAGNOSIS — Z3202 Encounter for pregnancy test, result negative: Secondary | ICD-10-CM | POA: Diagnosis not present

## 2018-03-31 DIAGNOSIS — Z1389 Encounter for screening for other disorder: Secondary | ICD-10-CM

## 2018-03-31 DIAGNOSIS — O165 Unspecified maternal hypertension, complicating the puerperium: Secondary | ICD-10-CM | POA: Diagnosis not present

## 2018-03-31 DIAGNOSIS — Z1331 Encounter for screening for depression: Secondary | ICD-10-CM

## 2018-03-31 LAB — POCT URINALYSIS DIP (DEVICE)
BILIRUBIN URINE: NEGATIVE
Glucose, UA: NEGATIVE mg/dL
KETONES UR: NEGATIVE mg/dL
LEUKOCYTES UA: NEGATIVE
NITRITE: NEGATIVE
PH: 5.5 (ref 5.0–8.0)
Protein, ur: NEGATIVE mg/dL
SPECIFIC GRAVITY, URINE: 1.015 (ref 1.005–1.030)
Urobilinogen, UA: 0.2 mg/dL (ref 0.0–1.0)

## 2018-03-31 LAB — POCT PREGNANCY, URINE: Preg Test, Ur: NEGATIVE

## 2018-03-31 MED ORDER — MAGNESIUM MALATE 1250 (141.7 MG) MG PO TABS: 1.0000 | ORAL_TABLET | Freq: Every day | ORAL | 0 refills | Status: DC

## 2018-03-31 MED ORDER — MEDROXYPROGESTERONE ACETATE 150 MG/ML IM SUSP
150.0000 mg | Freq: Once | INTRAMUSCULAR | Status: AC
  Administered 2018-03-31: 150 mg via INTRAMUSCULAR

## 2018-03-31 MED ORDER — ASPIRIN-ACETAMINOPHEN-CAFFEINE 250-250-65 MG PO TABS: ORAL_TABLET | ORAL | 0 refills | Status: DC

## 2018-03-31 MED ORDER — PREPLUS 27-1 MG PO TABS: 1.0000 | ORAL_TABLET | Freq: Every day | ORAL | 6 refills | Status: DC

## 2018-03-31 NOTE — BH Specialist Note (Signed)
Integrated Behavioral Health Follow Up Visit  MRN: [id number] Name: Linda Duffy  Number of Integrated Behavioral Health Clinician visits: 2/6 Session Start time: 2:20 Session End time: 2:44 Total time: 20 minutes  Type of Service: Integrated Behavioral Health- Individual/Family Interpretor:No. Interpretor Name and Language: n/a  SUBJECTIVE: Linda Duffy is a 32 y.o. female accompanied by newborn daughter Patient was referred by  Bingharlie Pickens, MD for symptoms of depression. Patient reports the following symptoms/concerns: Pt states that her primary symptom is crying spells, that she attributes to "hormonal changes" and worry about elevated blood pressure, headaches, and lack of support  postpartum. Pt feels best when she is able to get out of the house with the children, and feels she is coping well.  Duration of problem: Postpartum increase; Severity of problem: moderate  OBJECTIVE: Mood: Anxious and Depressed and Affect: Tearful Risk of harm to self or others: No plan to harm self or others  LIFE CONTEXT: Family and Social: Pt lives with her four children(14,2,1; newborn) School/Work: Pt uncertain about returning to work at this time Self-Care: Recognizing the need for simple self-care  Life Changes: Recent childbirth; FOB incarcerated (for about 9 more months)  GOALS ADDRESSED: Patient will: 1.  Reduce symptoms of: anxiety, depression and stress  2.  Increase knowledge and/or ability of: healthy habits and stress reduction  3.  Demonstrate ability to: Increase healthy adjustment to current life circumstances and Increase adequate support systems for patient/family  INTERVENTIONS: Interventions utilized:  Solution-Focused Strategies Standardized Assessments completed: GAD-7 and PHQ 9  ASSESSMENT: Patient currently experiencing Adjustment disorder with mixed anxious and depressed mood.   Patient may benefit from continued psychoeducation and brief interventions regarding  coping with symptoms of anxiety and depression.  PLAN: 1. Follow up with behavioral health clinician on : At next medical visit (3 weeks) 2. Behavioral recommendations:  -Take children to local park daily, weather permitting -PSI([employer]) online support group weekly, for additional support -Continue taking medication as prescribed by medical provider (iron and gabapentin) -Continue communicating with FOB on a routine basis 3. Referral(s): Integrated Behavioral Health Services (In Clinic) 4. "From scale of 1-10, how likely are you to follow plan?": 9  Valetta CloseJamie C KensingtonMcMannes, LCSW  Edinburgh Postnatal Depression Scale - 03/31/18 1334      Edinburgh Postnatal Depression Scale:  In the Past 7 Days   I have been able to laugh and see the funny side of things.  2    I have looked forward with enjoyment to things.  2    I have blamed myself unnecessarily when things went wrong.  1    I have been anxious or worried for no good reason.  0    I have felt scared or panicky for no good reason.  0    Things have been getting on top of me.  0    I have been so unhappy that I have had difficulty sleeping.  0    I have felt sad or miserable.  2    I have been so unhappy that I have been crying.  3    The thought of harming myself has occurred to me.  0    Edinburgh Postnatal Depression Scale Total  10      [facility] 02/04/2018 01/07/2018 12/28/2017 11/26/2017 11/26/2017  Decreased Interest 0 0 0 - 0  Down, Depressed, Hopeless 0 0 0 - 0  PHQ - 2 Score 0 0 0 - 0  Altered sleeping 2  0 1 3 3   Tired, decreased energy 3 1 2 2 2   Change in appetite 2 1 1 1  0  Feeling bad or failure about yourself  0 0 0 0 0  Trouble concentrating 0 0 0 0 0  Moving slowly or fidgety/restless 0 0 0 0 0  Suicidal thoughts 0 0 0 0 0  PHQ-9 Score 7 2 4  - 5   GAD 7 : Generalized Anxiety Score 02/04/2018 01/07/2018 12/28/2017 11/26/2017  Nervous, Anxious, on Edge 0 0 0 1  Control/stop worrying 0 0  0 0  Worry too much - different things 0 0 0 0  Trouble relaxing 1 0 0 1  Restless 0 0 0 0  Easily annoyed or irritable 0 0 1 1  Afraid - awful might happen 0 0 0 0  Total GAD 7 Score 1 0 1 3

## 2018-03-31 NOTE — Progress Notes (Signed)
Obstetrics and Gynecology Postpartum Visit  Appointment Date: 03/31/2018  [facility]  [facility]  Chief Complaint:  Chief Complaint  Patient presents with  . Postpartum Care    History of Present Illness: Garnette Czechiara Collar is a 32 y.o. African-American [id number] (No LMP recorded.), seen for the above chief complaint. Her past medical history is significant for history of c-section   She is s/p rLTCS on 02/17/18; she was discharged to home on POD#2  Patient has gone to ED and MAU for chest pain work up; had negative PE work up.. Had gHTN in the ED. Pt states has HA a mostly constant HA since about 2wks PP that's mostly frontal/right sided. No h/o migraines. Hasn't tried anything OTC. No visual changes  Vaginal bleeding or discharge: No  Breast or formula feeding: breast only Intercourse: No  Contraception after delivery: lactational amenorrhea PP depression s/s: mild Any bowel or bladder issues: No  Pap smear: no abnormalities/HPV neg (date: 2016)  Review of Systems: as noted in the History of Present Illness.  Patient Active Problem List   Diagnosis Date Noted  . 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 Status post repeat low transverse cesarean section 02/17/2018  . 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 Supervision of normal pregnancy 04/11/2011    Medications We administered medroxyPROGESTERone. Current Outpatient Medications  Medication Sig Dispense Refill  . acidophilus (RISAQUAD) CAPS capsule Take 1 capsule by mouth daily.    . ferrous sulfate (FERROUSUL) 325 (65 FE) MG tablet Take 1 tablet (325 mg total) by mouth 2 (two) times daily. 60 tablet 1  . Prenatal Vit-Fe Fumarate-FA (PREPLUS) 27-1 MG TABS Take 1 tablet by mouth daily. 30 tablet 6   No current facility-administered medications for this visit.     Allergies Patient has no known allergies.  Physical Exam:  BP (!) 143/69   Pulse 75    Wt 140 lb 9.6 oz (63.8 kg)   Breastfeeding? Yes   BMI 22.02 kg/m  Body mass index is 22.02 kg/m. General appearance: Well nourished, well developed female in no acute distress.  Cardiovascular: normal s1 and s2.  No murmurs, rubs or gallops. Respiratory:  Clear to auscultation bilateral. Normal respiratory effort Abdomen: positive bowel sounds and no masses, hernias; diffusely non tender to palpation, non distended. Well healed low transverse skin incision Neuro/Psych:  CN 2-12 grossly intact, EOMI, PEERL, normal gait, 2+ patellar and brachial, 5/5 strength and normal sensation throughout. Normal mood and affect.  Skin:  Warm and dry.   Laboratory: UPT negative  PP Depression Screening:  EPDS 10, no thoughts of self harm  Assessment: pt stable.   Plan:  *PP: Routine care. Pt would like to do depo again. Pt amenable to meeting with Behavioral Health for EPDS score.  *gHTN: no s/s of pre-eclampsia. Will get labwork. D/w her to see if still hypertensive 12wks PP and then would be cHTN. Will see back in a few weeks and if HA not better, consider CT head. Recommend OTC analgesics used sparingly and can use qday Magnesium in the interim.   Orders Placed This Encounter  Procedures  . Protein / creatinine ratio, urine  . CBC  . CMP and Liver  . TSH  . Ambulatory referral to Integrated Behavioral Health  . POCT urinalysis dip (device)  . Pregnancy, urine POC    RTC 2-3wks  Cornelia Copaharlie Armstrong Creasy, Jr MD Attending [facility](Faculty Practice)

## 2018-04-01 LAB — PROTEIN / CREATININE RATIO, URINE
Creatinine, Urine: 54.4 mg/dL
PROTEIN UR: 6.4 mg/dL
PROTEIN/CREAT RATIO: 118 mg/g{creat} (ref 0–200)

## 2018-04-01 LAB — CMP AND LIVER
ALT: 6 IU/L (ref 0–32)
AST: 11 IU/L (ref 0–40)
Albumin: 4.7 g/dL (ref 3.5–5.5)
Alkaline Phosphatase: 71 IU/L (ref 39–117)
BILIRUBIN TOTAL: 0.5 mg/dL (ref 0.0–1.2)
BUN: 11 mg/dL (ref 6–20)
Bilirubin, Direct: 0.14 mg/dL (ref 0.00–0.40)
CALCIUM: 9.4 mg/dL (ref 8.7–10.2)
CHLORIDE: 104 mmol/L (ref 96–106)
CO2: 23 mmol/L (ref 20–29)
CREATININE: 0.79 mg/dL (ref 0.57–1.00)
GFR, EST AFRICAN AMERICAN: 114 mL/min/{1.73_m2} (ref >59)
GFR, EST NON AFRICAN AMERICAN: 99 mL/min/{1.73_m2} (ref >59)
GLUCOSE: 92 mg/dL (ref 65–99)
Potassium: 4 mmol/L (ref 3.5–5.2)
Sodium: 141 mmol/L (ref 134–144)
Total Protein: 7.1 g/dL (ref 6.0–8.5)

## 2018-04-01 LAB — CBC
HEMOGLOBIN: 12.5 g/dL (ref 11.1–15.9)
Hematocrit: 39.4 % (ref 34.0–46.6)
MCH: 31.4 pg (ref 26.6–33.0)
MCHC: 31.7 g/dL (ref 31.5–35.7)
MCV: 99 fL — ABNORMAL HIGH (ref 79–97)
PLATELETS: 235 10*3/uL (ref 150–450)
RBC: 3.98 x10E6/uL (ref 3.77–5.28)
RDW: 13.5 % (ref 12.3–15.4)
WBC: 4.4 10*3/uL (ref 3.4–10.8)

## 2018-04-01 LAB — TSH: TSH: 2.57 u[IU]/mL (ref 0.450–4.500)

## 2018-04-04 ENCOUNTER — Encounter: Payer: Self-pay | Admitting: Obstetrics and Gynecology

## 2018-04-05 ENCOUNTER — Telehealth: Payer: Self-pay | Admitting: *Deleted

## 2018-04-05 NOTE — Telephone Encounter (Signed)
-----   Message from Thorp Bingharlie Pickens, MD sent at 04/01/2018  9:50 AM EDT ----- All normal. Can you let her know that she can stop her extra PO iron that she's on? thanks

## 2018-04-05 NOTE — Telephone Encounter (Signed)
I called Linda Duffy and left a message we are calling with some non- urgent information- please call our office back.

## 2018-04-06 NOTE — Telephone Encounter (Signed)
LM for pt that her results are normal and if she has any questions to please give us a call.

## 2018-05-25 ENCOUNTER — Other Ambulatory Visit: Payer: Self-pay | Admitting: *Deleted

## 2018-05-25 NOTE — Telephone Encounter (Signed)
Received a refill request for magnesium. Will route to provider.

## 2018-05-27 MED ORDER — MAGNESIUM MALATE 1250 (141.7 MG) MG PO TABS: 1.0000 | ORAL_TABLET | Freq: Every day | ORAL | 0 refills | Status: DC

## 2018-09-05 IMAGING — CR DG CHEST 2V
2 series · 2 of 2 positions shown · non-contrast
Comparison: CT chest 03/01/2018

CLINICAL DATA: Chest pain

EXAM:
CHEST - 2 VIEW

[chest pa]
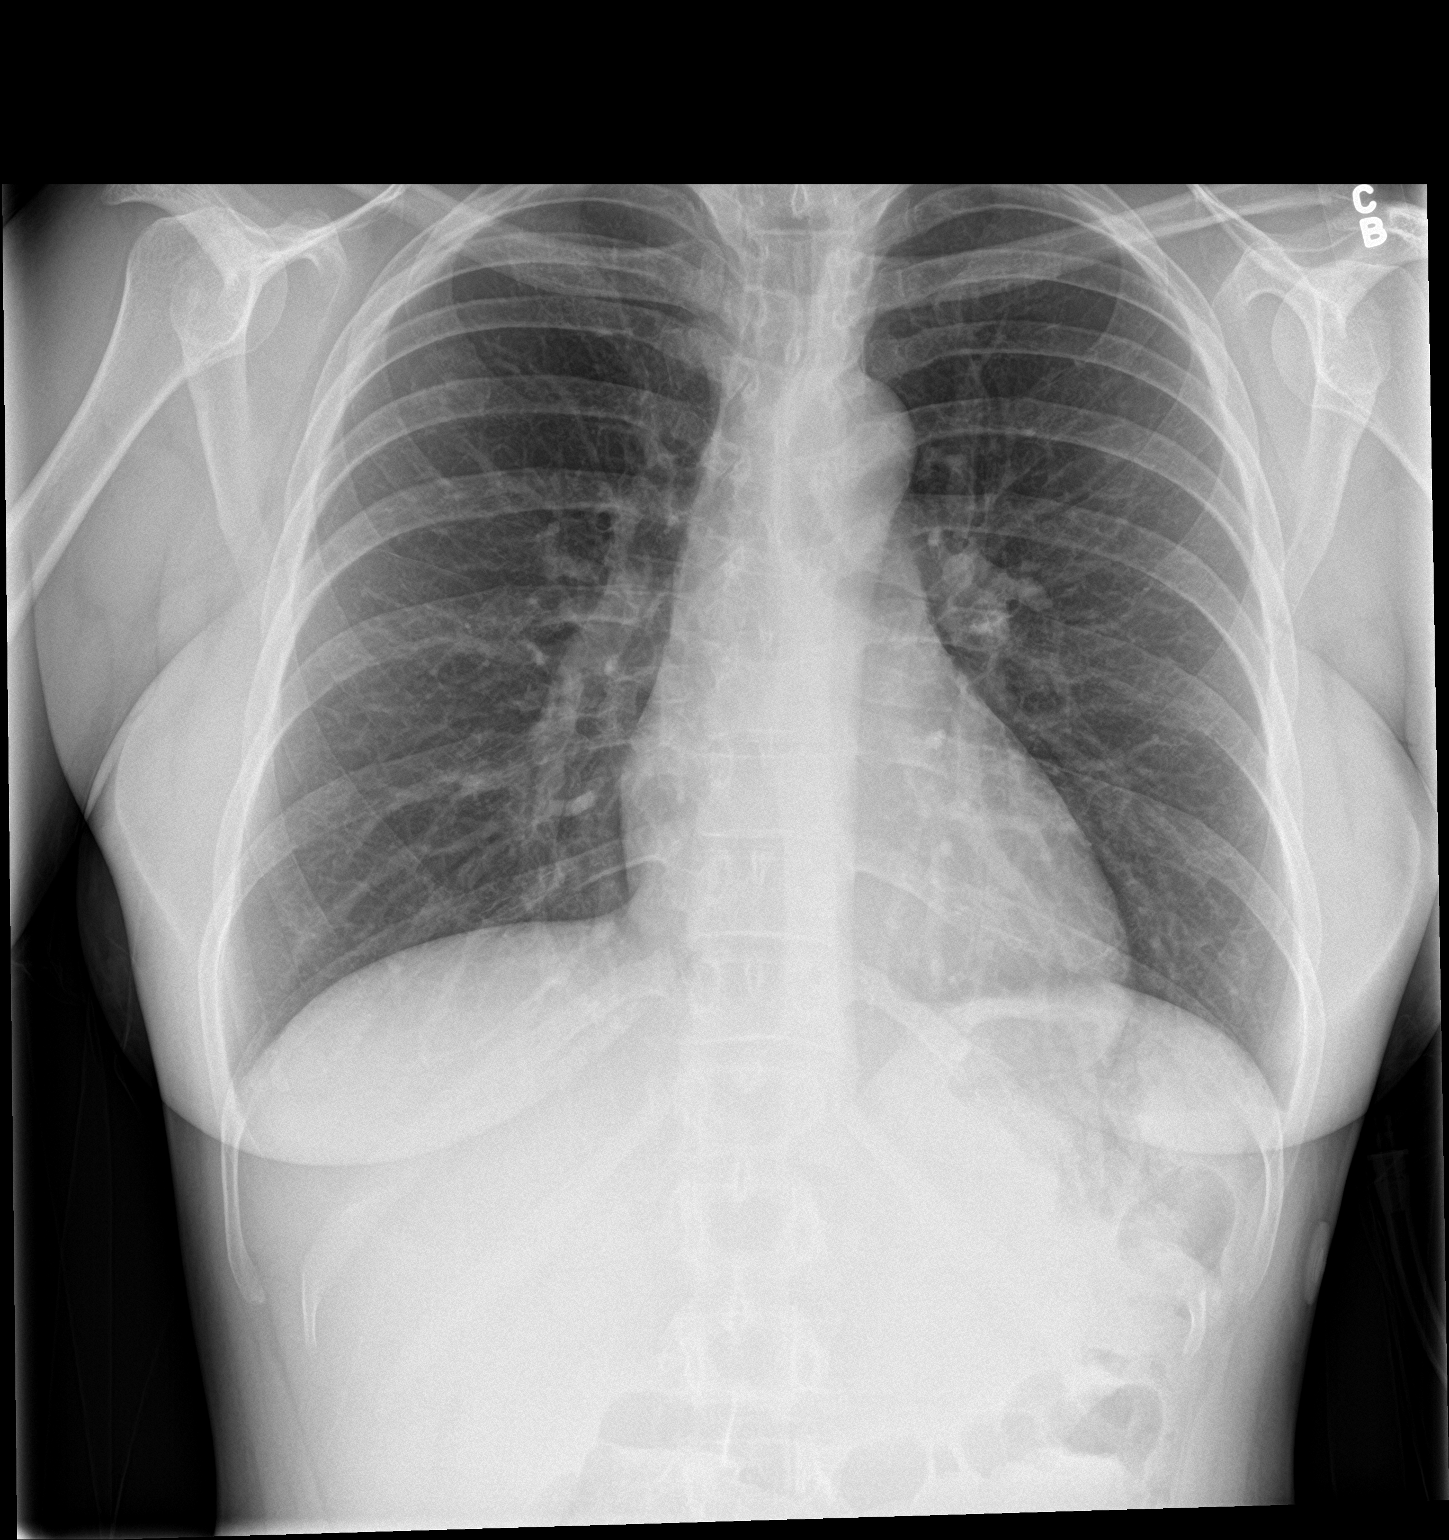

[chest lat]
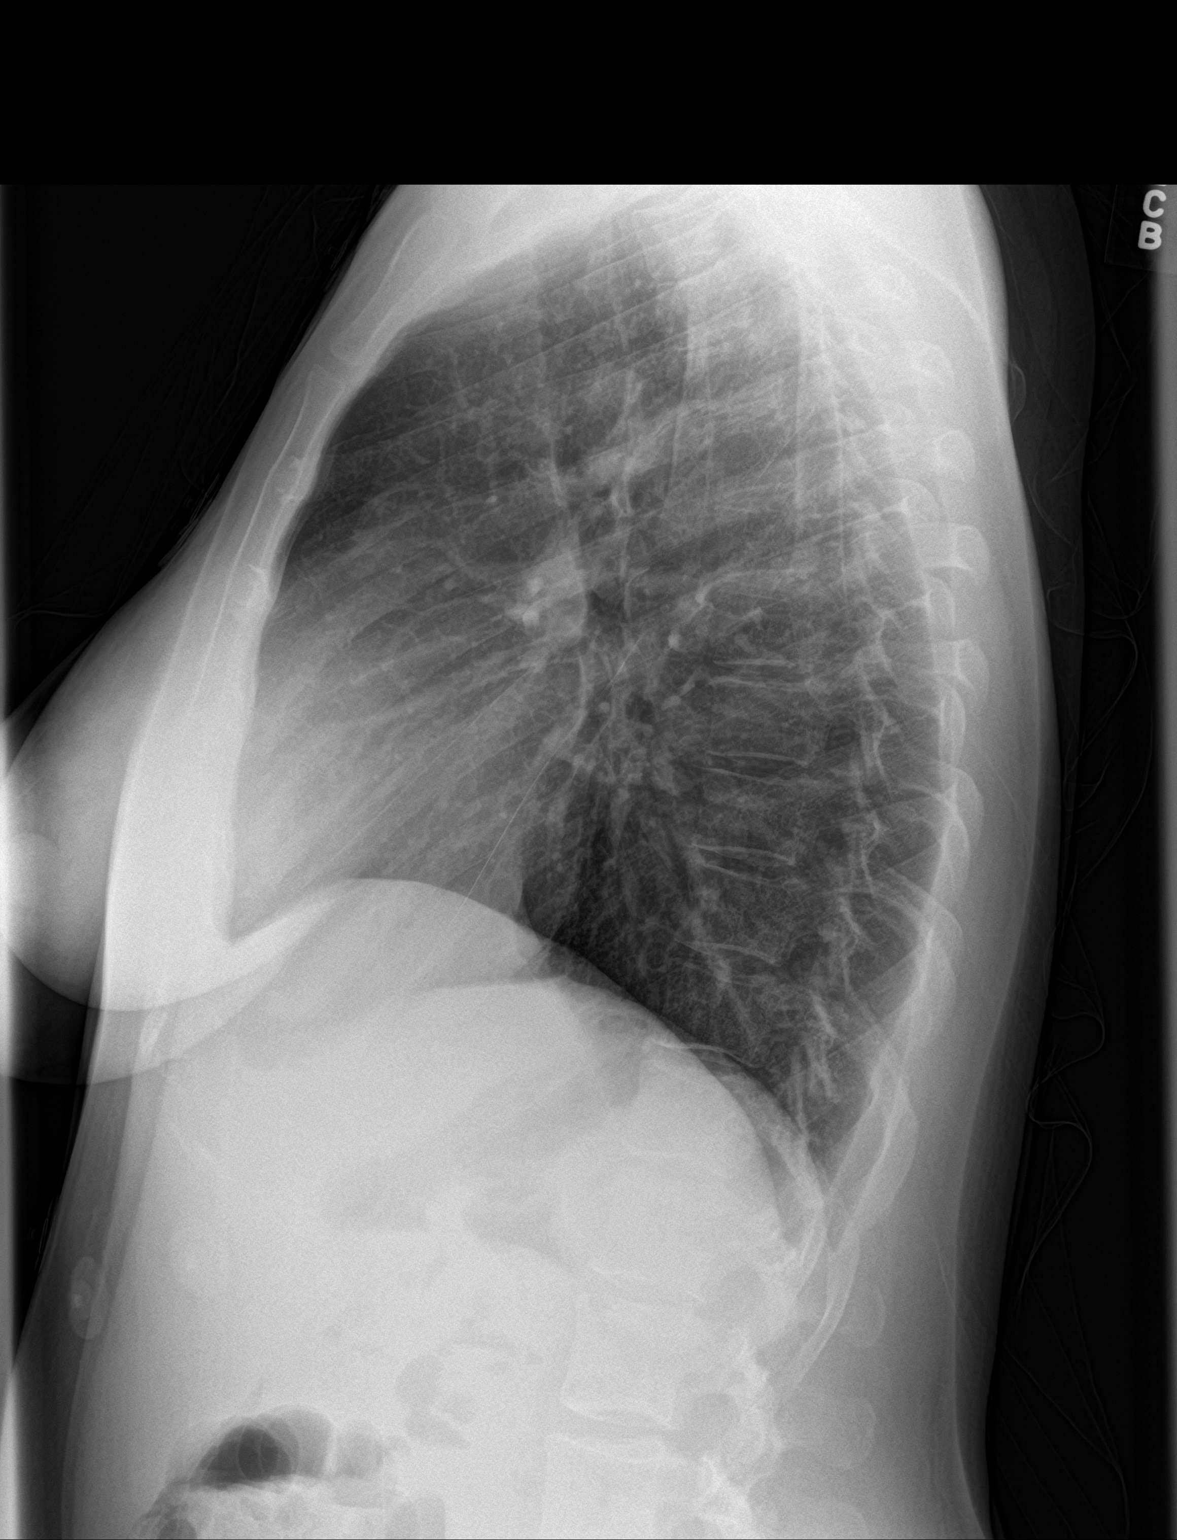

[2 of 2 positions shown; findings below may reference images not displayed]

FINDINGS: The heart size and mediastinal contours are within normal limits.
Both lungs are clear. The visualized skeletal structures are
unremarkable.
IMPRESSION: No active cardiopulmonary disease.

## 2018-12-23 ENCOUNTER — Encounter: Payer: Self-pay | Admitting: *Deleted

## 2019-05-17 ENCOUNTER — Encounter: Payer: Self-pay | Admitting: Emergency Medicine

## 2019-05-17 ENCOUNTER — Emergency Department (HOSPITAL_COMMUNITY)
Admission: EM | Admit: 2019-05-17 | Discharge: 2019-05-17 | Disposition: A | Discharge status: Home / Self Care | Payer: Medicaid Other | Attending: Emergency Medicine | Admitting: Emergency Medicine

## 2019-05-17 ENCOUNTER — Other Ambulatory Visit: Payer: Self-pay | Admitting: Physician Assistant

## 2019-05-17 DIAGNOSIS — Z3A14 14 weeks gestation of pregnancy: Secondary | ICD-10-CM | POA: Diagnosis not present

## 2019-05-17 DIAGNOSIS — O2302 Infections of kidney in pregnancy, second trimester: Secondary | ICD-10-CM

## 2019-05-17 DIAGNOSIS — Z3201 Encounter for pregnancy test, result positive: Secondary | ICD-10-CM | POA: Diagnosis not present

## 2019-05-17 DIAGNOSIS — Z87891 Personal history of nicotine dependence: Secondary | ICD-10-CM | POA: Insufficient documentation

## 2019-05-17 DIAGNOSIS — R1010 Upper abdominal pain, unspecified: Secondary | ICD-10-CM | POA: Diagnosis present

## 2019-05-17 LAB — COMPREHENSIVE METABOLIC PANEL
ALT: 10 U/L (ref 0–44)
AST: 12 U/L — ABNORMAL LOW (ref 15–41)
Albumin: 3.5 g/dL (ref 3.5–5.0)
Alkaline Phosphatase: 71 U/L (ref 38–126)
Anion gap: 13 (ref 5–15)
BUN: <5 mg/dL — ABNORMAL LOW (ref 6–20)
CO2: 19 mmol/L — ABNORMAL LOW (ref 22–32)
Calcium: 8.9 mg/dL (ref 8.9–10.3)
Chloride: 102 mmol/L (ref 98–111)
Creatinine, Ser: 0.63 mg/dL (ref 0.44–1.00)
GFR calc Af Amer: >60 mL/min (ref >60)
GFR calc non Af Amer: >60 mL/min (ref >60)
Glucose, Bld: 95 mg/dL (ref 70–99)
Potassium: 3 mmol/L — ABNORMAL LOW (ref 3.5–5.1)
Sodium: 134 mmol/L — ABNORMAL LOW (ref 135–145)
Total Bilirubin: 0.7 mg/dL (ref 0.3–1.2)
Total Protein: 7.1 g/dL (ref 6.5–8.1)

## 2019-05-17 LAB — CBC
HCT: 30.2 % — ABNORMAL LOW (ref 36.0–46.0)
Hemoglobin: 10.7 g/dL — ABNORMAL LOW (ref 12.0–15.0)
MCH: 32 pg (ref 26.0–34.0)
MCHC: 35.4 g/dL (ref 30.0–36.0)
MCV: 90.4 fL (ref 80.0–100.0)
Platelets: 232 10*3/uL (ref 150–400)
RBC: 3.34 MIL/uL — ABNORMAL LOW (ref 3.87–5.11)
RDW: 13.2 % (ref 11.5–15.5)
WBC: 8.7 10*3/uL (ref 4.0–10.5)
nRBC: 0 % (ref 0.0–0.2)

## 2019-05-17 LAB — WET PREP, GENITAL
Sperm: NONE SEEN
Trich, Wet Prep: NONE SEEN
WBC, Wet Prep HPF POC: NONE SEEN
Yeast Wet Prep HPF POC: NONE SEEN

## 2019-05-17 LAB — I-STAT BETA HCG BLOOD, ED (MC, WL, AP ONLY): I-stat hCG, quantitative: >2000 m[IU]/mL — ABNORMAL HIGH (ref <5)

## 2019-05-17 LAB — HCG, QUANTITATIVE, PREGNANCY: hCG, Beta Chain, Quant, S: 100652 m[IU]/mL — ABNORMAL HIGH (ref <5)

## 2019-05-17 LAB — URINALYSIS, ROUTINE W REFLEX MICROSCOPIC
Bilirubin Urine: NEGATIVE
Glucose, UA: NEGATIVE mg/dL
Ketones, ur: 80 mg/dL — AB
Nitrite: NEGATIVE
Protein, ur: 30 mg/dL — AB
Specific Gravity, Urine: 1.012 (ref 1.005–1.030)
WBC, UA: >50 WBC/hpf — ABNORMAL HIGH (ref 0–5)
pH: 6 (ref 5.0–8.0)

## 2019-05-17 LAB — LIPASE, BLOOD: Lipase: 18 U/L (ref 11–51)

## 2019-05-17 MED ORDER — SODIUM CHLORIDE 0.9 % IV BOLUS (SEPSIS)
1000.0000 mL | Freq: Once | INTRAVENOUS | Status: AC
  Administered 2019-05-17: 1000 mL via INTRAVENOUS

## 2019-05-17 MED ORDER — POTASSIUM CHLORIDE CRYS ER 20 MEQ PO TBCR
40.0000 meq | EXTENDED_RELEASE_TABLET | Freq: Once | ORAL | Status: AC
  Administered 2019-05-17: 18:00:00 40 meq via ORAL
  Filled 2019-05-17: qty 2

## 2019-05-17 MED ORDER — ONDANSETRON HCL 4 MG PO TABS: 4.0000 mg | ORAL_TABLET | Freq: Four times a day (QID) | ORAL | 0 refills | Status: DC

## 2019-05-17 MED ORDER — SODIUM CHLORIDE 0.9 % IV SOLN
1.0000 g | Freq: Once | INTRAVENOUS | Status: AC
  Administered 2019-05-17: 1 g via INTRAVENOUS
  Filled 2019-05-17: qty 10

## 2019-05-17 MED ORDER — ONDANSETRON 4 MG PO TBDP
4.0000 mg | ORAL_TABLET | Freq: Once | ORAL | Status: DC | PRN
  Filled 2019-05-17: qty 1

## 2019-05-17 MED ORDER — CEPHALEXIN 500 MG PO CAPS: 500.0000 mg | ORAL_CAPSULE | Freq: Two times a day (BID) | ORAL | 0 refills | Status: AC

## 2019-05-17 MED ORDER — SODIUM CHLORIDE 0.9% FLUSH
3.0000 mL | Freq: Once | INTRAVENOUS | Status: AC
  Administered 2019-05-17: 3 mL via INTRAVENOUS

## 2019-05-17 NOTE — ED Provider Notes (Signed)
[facility] Provider Note   CSN: [id number] Arrival date & time: 05/17/19  1237     History   Chief Complaint No chief complaint on file.   HPI Linda Duffy is a 33 y.o. female.     Patient is a G6, P5 female with past medical history of sickle cell trait who presents emergency department for flank pain, abdominal pain, nausea vomiting.  Patient reports that she has been feeling sick for about 3 weeks.  She questions if she is pregnant or not.  Reports that she also feels like when she had a kidney infection in the past.  Reports nausea and vomiting and having hard time holding down fluids.  Denies any fever, chills, chest pain, shortness of breath, vaginal bleeding, vaginal discharge.  Denies any hematuria.  Reports lower pelvic pressure and burning with urination.     Past Medical History:  Diagnosis Date  . Abnormal Pap smear    had cryo after 1st c-section and nl after that  . Anemia   . 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 Ovarian cyst 2012    Patient Active Problem List   Diagnosis Date Noted  . 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 Status post repeat low transverse cesarean section 02/17/2018  . 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 Supervision of normal pregnancy 04/11/2011    Past Surgical History:  Procedure Laterality Date  . CERVIX LESION DESTRUCTION     after 1st c-section and nl paps after that  . CESAREAN SECTION     x1  . CESAREAN SECTION N/A 05/24/2015   Procedure: REPEAT CESAREAN SECTION;  Surgeon: Kathreen CosierBernard A Marshall, MD;  Location: [facility];  Service: Obstetrics;  Laterality: N/A;  . CESAREAN SECTION N/A 07/04/2016   Procedure: CESAREAN SECTION;  Surgeon: Willodean Rosenthalarolyn Harraway-Smith, MD;  [facility];  Service: Obstetrics;  Laterality: N/A;  . CESAREAN SECTION N/A 02/17/2018   Procedure: REPEAT CESAREAN SECTION;  Surgeon: Kathrynn RunningWouk, Noah Bedford, MD;  [facility];  Service:  Obstetrics;  Laterality: N/A;  . DILATION AND CURETTAGE OF UTERUS     x2 for EABs     OB History    Gravida  6   Para  3   Term  3   Preterm  0   AB  2   Living  3     SAB  0   TAB  2   Ectopic  0   Multiple  0   Live Births  3            Home Medications    Prior to Admission medications   Medication Sig Start Date End Date Taking? Authorizing Provider  acidophilus (RISAQUAD) CAPS capsule Take 1 capsule by mouth daily.    [provider]  aspirin-acetaminophen-caffeine (EXCEDRIN MIGRAINE) 8450986330250-250-65 MG tablet Use once or twice a week at the most. 03/31/18   [place] BingPickens, Charlie, MD  cephALEXin (KEFLEX) 500 MG capsule Take 1 capsule (500 mg total) by mouth 2 (two) times daily for 10 days. 05/17/19 05/27/19  Arlyn DunningMcLean, Raymond Bhardwaj A, PA-C  ferrous sulfate (FERROUSUL) 325 (65 FE) MG tablet Take 1 tablet (325 mg total) by mouth 2 (two) times daily. 06/25/16   Anyanwu, Jethro BastosUgonna A, MD  gabapentin (NEURONTIN) 300 MG capsule Take 1 capsule (300 mg total) by mouth 2 (two) times daily as needed (neuropathic incisional pain). 03/01/18   Pincus LargePhelps, Jazma Y, DO  ibuprofen (ADVIL,MOTRIN) 600 MG tablet Take 1 tablet (600 mg  total) by mouth every 4 (four) hours as needed for moderate pain. 03/01/18   Pincus Large, DO  Magnesium Malate 1250 (141.7 Mg) MG TABS Take 1 tablet by mouth daily. 05/27/18   [employer] Bing, MD  methocarbamol (ROBAXIN) 500 MG tablet Take 1 tablet (500 mg total) by mouth 2 (two) times daily. 03/10/18   Garlon Hatchet, PA-C  ondansetron (ZOFRAN) 4 MG tablet Take 1 tablet (4 mg total) by mouth every 6 (six) hours. 05/17/19   Arlyn Dunning, PA-C  Prenatal Vit-Fe Fumarate-FA (PRENATAL MULTIVITAMIN) TABS tablet Take 1 tablet by mouth daily at 12 noon. 06/11/16   Aviva Signs, CNM  Prenatal Vit-Fe Fumarate-FA (PREPLUS) 27-1 MG TABS Take 1 tablet by mouth daily. 03/31/18   [place] Bing, MD    Family History Family History  Problem Relation Age of Onset  . Diabetes  Mother   . Hypertension Mother   . Heart disease Mother     Social History Social History   Tobacco Use  . Smoking status: Former Smoker    Packs/day: 0.25    Types: Cigarettes    Quit date: 09/18/2014    Years since quitting: 4.6  . Smokeless tobacco: Never Used  Substance Use Topics  . Alcohol use: No  . Drug use: Yes    Types: Marijuana    Comment: not since she found out she was pregnant     Allergies   Patient has no known allergies.   Review of Systems Review of Systems  Constitutional: Negative for appetite change, chills, diaphoresis, fatigue and fever.  HENT: Negative for ear pain and sore throat.   Eyes: Negative for pain and visual disturbance.  Respiratory: Negative for cough and shortness of breath.   Cardiovascular: Negative for chest pain and palpitations.  Gastrointestinal: Positive for abdominal pain, nausea and vomiting. Negative for diarrhea.  Genitourinary: Positive for dysuria, flank pain and pelvic pain. Negative for difficulty urinating, dyspareunia, hematuria, menstrual problem, vaginal bleeding, vaginal discharge and vaginal pain.  Musculoskeletal: Negative for arthralgias and back pain.  Skin: Negative for color change and rash.  Neurological: Negative for seizures and syncope.  All other systems reviewed and are negative.    Physical Exam Updated Vital Signs BP 119/68 (BP Location: Left Arm)   Pulse (!) 122   Temp 98.6 F (37 C) (Oral)   Resp 18   SpO2 98%   Physical Exam Vitals signs and nursing note reviewed. Exam conducted with a chaperone present.  Constitutional:      General: She is not in acute distress.    Appearance: Normal appearance. She is not ill-appearing, toxic-appearing or diaphoretic.  HENT:     Head: Normocephalic.  Eyes:     Conjunctiva/sclera: Conjunctivae normal.  Cardiovascular:     Rate and Rhythm: Normal rate and regular rhythm.  Pulmonary:     Effort: Pulmonary effort is normal.  Abdominal:      General: Abdomen is flat.     Tenderness: There is abdominal tenderness in the suprapubic area. There is right CVA tenderness and left CVA tenderness.  Genitourinary:    Labia:        Right: No rash, tenderness or lesion.        Left: No rash, tenderness or lesion.      Vagina: Normal.     Cervix: Normal.     Uterus: Enlarged.      Adnexa: Right adnexa normal and left adnexa normal.       Right: No  mass, tenderness or fullness.         Left: No mass, tenderness or fullness.    Skin:    General: Skin is dry.     Capillary Refill: Capillary refill takes less than 2 seconds.  Neurological:     General: No focal deficit present.     Mental Status: She is alert.  Psychiatric:        Mood and Affect: Mood normal.      ED Treatments / Results  Labs (all labs ordered are listed, but only abnormal results are displayed) Labs Reviewed  COMPREHENSIVE METABOLIC PANEL - Abnormal; Notable for the following components:      Result Value   Sodium 134 (*)    Potassium 3.0 (*)    CO2 19 (*)    BUN <5 (*)    AST 12 (*)    All other components within normal limits  CBC - Abnormal; Notable for the following components:   RBC 3.34 (*)    Hemoglobin 10.7 (*)    HCT 30.2 (*)    All other components within normal limits  URINALYSIS, ROUTINE W REFLEX MICROSCOPIC - Abnormal; Notable for the following components:   Color, Urine AMBER (*)    APPearance CLOUDY (*)    Hgb urine dipstick SMALL (*)    Ketones, ur 80 (*)    Protein, ur 30 (*)    Leukocytes,Ua LARGE (*)    WBC, UA >50 (*)    Bacteria, UA MANY (*)    All other components within normal limits  HCG, QUANTITATIVE, PREGNANCY - Abnormal; Notable for the following components:   hCG, Beta Chain, Quant, S 100,652 (*)    All other components within normal limits  I-STAT BETA HCG BLOOD, ED ([facility], [facility], [facility] ONLY) - Abnormal; Notable for the following components:   I-stat hCG, quantitative >2,000.0 (*)    All other components within normal limits   WET PREP, GENITAL  URINE CULTURE  LIPASE, BLOOD  GC/CHLAMYDIA PROBE AMP ([place]) NOT AT [facility]    EKG None  Radiology No results found.  Procedures Procedures (including critical care time)  Medications Ordered in ED Medications  ondansetron (ZOFRAN-ODT) disintegrating tablet 4 mg (has no administration in time range)  sodium chloride 0.9 % bolus 1,000 mL (1,000 mLs Intravenous New Bag/Given 05/17/19 1744)  sodium chloride flush (NS) 0.9 % injection 3 mL (3 mLs Intravenous Given 05/17/19 1746)  potassium chloride SA (K-DUR) CR tablet 40 mEq (40 mEq Oral Given 05/17/19 1746)  cefTRIAXone (ROCEPHIN) 1 g in sodium chloride 0.9 % 100 mL IVPB (1 g Intravenous New Bag/Given 05/17/19 1746)     Initial Impression / Assessment and Plan / ED Course  I have reviewed the triage vital signs and the nursing notes.  Pertinent labs & imaging results that were available during my care of the patient were reviewed by me and considered in my medical decision making (see chart for details).  Clinical Course as of May 16 1818  Tue May 17, 2019  1728 Patient presenting with nausea vomiting for 3 weeks and flank pain for the last week or so.  Positive i-STAT hCG.  Dr. Adela Lank at bedside to perform ultrasound to rule out ectopic pregnancy.  Patient appears to have a pregnancy of about 14 weeks in the uterus.  Patient is happy and in good spirits.  She does have signs and symptoms of pyelonephritis based on her symptoms and urinalysis.  Patient would also like STD testing.  She was given Zofran for her nausea with good relief.  IV fluids , PO potassium and IV Rocephin ordered.   [KM]  2694 Patient is improved with fluids and Rocephin.  Tolerating p.o.  Has appointment coming up with OB/GYN.  Will be sent home with Keflex and Zofran.   [KM]    Clinical Course User Index [KM] Alveria Apley, PA-C       . Counseled pt on good return precautions and encouraged both PCP and ED follow-up as needed.   Prior to discharge, I also discussed incidental imaging findings with patient in detail and advised appropriate, recommended follow-up in detail.  Clinical Impression: 1. Pyelonephritis affecting pregnancy in second trimester     Disposition: Discharge    This note was prepared with assistance of Dragon voice recognition software. Occasional wrong-word or sound-a-like substitutions may have occurred due to the inherent limitations of voice recognition software.   Final Clinical Impressions(s) / ED Diagnoses   Final diagnoses:  Pyelonephritis affecting pregnancy in second trimester    ED Discharge Orders         Ordered    cephALEXin (KEFLEX) 500 MG capsule  2 times daily     05/17/19 1812    ondansetron (ZOFRAN) 4 MG tablet  Every 6 hours     05/17/19 1818           Kristine Royal 05/17/19 [place], [place], DO 05/17/19 1824

## 2019-05-17 NOTE — ED Triage Notes (Signed)
Pt here with c/o n/v and bil kidney pain that has been ongoing for 2 weeks , pt also thinks that she may be pregnant and has had some slight discharge

## 2019-05-17 NOTE — ED Notes (Signed)
Patient Alert and oriented to baseline. Stable and ambulatory to baseline. Patient verbalized understanding of the discharge instructions.  Patient belongings were taken by the patient.   

## 2019-05-17 NOTE — Discharge Instructions (Addendum)
Thank you for allowing me to care for you today. Please return to the emergency department if you have new or worsening symptoms. Take your medications as instructed.  ° °

## 2019-05-19 LAB — URINE CULTURE: Culture: >=100000 — AB

## 2019-05-19 LAB — CERVICOVAGINAL ANCILLARY ONLY
Chlamydia: NEGATIVE
Neisseria Gonorrhea: NEGATIVE

## 2019-05-20 ENCOUNTER — Telehealth: Payer: Self-pay | Admitting: Emergency Medicine

## 2019-05-20 NOTE — Telephone Encounter (Signed)
Post ED Visit - Positive Culture Follow-up  Culture report reviewed by antimicrobial stewardship pharmacist: [place] Team []  Elenor Quinones, Pharm.D. []  Heide Guile, Pharm.D., BCPS AQ-ID []  [place]ks Neptune, Pharm.D., BCPS []  Alycia Rossetti, Pharm.D., BCPS []  [place], Pharm.D., BCPS, AAHIVP []  Legrand Como, Pharm.D., BCPS, AAHIVP []  Salome Arnt, PharmD, BCPS []  Johnnette Gourd, PharmD, BCPS [x]  Hughes Better, PharmD, BCPS []  Leeroy Cha, PharmD []  Laqueta Linden, PharmD, BCPS []  Albertina Parr, PharmD  [place] Team []  Leodis Sias, PharmD []  Lindell Spar, PharmD []  Royetta Asal, PharmD []  Graylin Shiver, Rph []  Rema Fendt) Glennon Mac, PharmD []  Arlyn Dunning, PharmD []  Netta Cedars, PharmD []  Dia Sitter, PharmD []  Leone Haven, PharmD []  Gretta Arab, PharmD []  Theodis Shove, PharmD []  Peggyann Juba, PharmD []  Reuel Boom, PharmD   Positive urine culture Treated with Cephalexin, organism sensitive to the same and no further patient follow-up is required at this time.  Linda Duffy 05/20/2019, 10:43 AM

## 2019-06-16 ENCOUNTER — Ambulatory Visit (INDEPENDENT_AMBULATORY_CARE_PROVIDER_SITE_OTHER): Payer: Medicaid Other | Admitting: *Deleted

## 2019-06-16 ENCOUNTER — Other Ambulatory Visit: Payer: Self-pay

## 2019-06-16 ENCOUNTER — Encounter: Payer: Self-pay | Admitting: *Deleted

## 2019-06-16 DIAGNOSIS — Z8679 Personal history of other diseases of the circulatory system: Secondary | ICD-10-CM | POA: Insufficient documentation

## 2019-06-16 DIAGNOSIS — F329 Major depressive disorder, single episode, unspecified: Secondary | ICD-10-CM | POA: Insufficient documentation

## 2019-06-16 DIAGNOSIS — F32A Depression, unspecified: Secondary | ICD-10-CM | POA: Insufficient documentation

## 2019-06-16 DIAGNOSIS — O0990 Supervision of high risk pregnancy, unspecified, unspecified trimester: Secondary | ICD-10-CM | POA: Insufficient documentation

## 2019-06-16 DIAGNOSIS — N12 Tubulo-interstitial nephritis, not specified as acute or chronic: Secondary | ICD-10-CM

## 2019-06-16 DIAGNOSIS — O09529 Supervision of elderly multigravida, unspecified trimester: Secondary | ICD-10-CM | POA: Insufficient documentation

## 2019-06-16 DIAGNOSIS — Z8759 Personal history of other complications of pregnancy, childbirth and the puerperium: Secondary | ICD-10-CM | POA: Insufficient documentation

## 2019-06-16 DIAGNOSIS — O09899 Supervision of other high risk pregnancies, unspecified trimester: Secondary | ICD-10-CM | POA: Insufficient documentation

## 2019-06-16 DIAGNOSIS — F3289 Other specified depressive episodes: Secondary | ICD-10-CM

## 2019-06-16 MED ORDER — BLOOD PRESSURE KIT DEVI: 1.0000 | 0 refills | Status: DC | PRN

## 2019-06-16 NOTE — Patient Instructions (Signed)

## 2019-06-16 NOTE — Progress Notes (Signed)
3:28 I called [place] and left a message I am calling for your telephone visit and will call again in a few minutes. Please be available by phone.  Promise Bushong,RN 3:35 I called [name] and left a message I am calling for her telephone visit and since I did not reach her; it will need to be scheduled. Please call our office to reschedule.  I called her contact number and she was there and came to the line.  I connected with  Linda Duffy on 06/16/19 at  3:30 PM EDT by telephone and verified that I am speaking with the correct person using two identifiers.   I discussed the limitations, risks, security and privacy concerns of performing an evaluation and management service by telephone and the availability of in person appointments. I also discussed with the patient that there may be a patient responsible charge related to this service. The patient expressed understanding and agreed to proceed. Explained I am completing her New OB Intake today. We discussed Her EDD and that it is based on  sure LMP .  She states she had her first period after her last child in March, then a regular period in April and May with LMP 01/13/19.  I reviewed her allergies, meds, OB History, Medical /Surgical history, and appropriate screenings. I explained I will send her the [employer] app- app sent to her while on phone.  I explained we will send a blood pressure cuff to [facility] that will fill that prescription and they  will call her to verify her information. I asked her to bring the blood pressure cuff with her to her first ob appointment so we can show her how to use it. Explained  then we will have her take her blood pressure weekly and enter into the app. Explained she will have some visits in office and some virtually. She already has Community education officer. Reviewed appointment date/ time with her , our location and to wear mask, no visitors. Explained she will have exam, ob bloodwork, hemoglobin a1C, cbg , genetic testing if desired,  pap . I scheduled an [place] for first available appointment and gave her the appointment. I also offered her genetic counseling due to her age- she declined. She also confirmed her pyelonephritis is better- no symptoms of that currently.  She voices understanding.   [place] Cruzan,RN 06/16/2019  3:39 PM

## 2019-06-23 DIAGNOSIS — O0990 Supervision of high risk pregnancy, unspecified, unspecified trimester: Secondary | ICD-10-CM | POA: Diagnosis not present

## 2019-06-29 ENCOUNTER — Ambulatory Visit (HOSPITAL_COMMUNITY)
Admission: RE | Admit: 2019-06-29 | Discharge: 2019-06-29 | Disposition: A | Discharge status: Home / Self Care | Payer: Medicaid Other | Source: Ambulatory Visit | Attending: Obstetrics and Gynecology | Admitting: Obstetrics and Gynecology

## 2019-06-29 ENCOUNTER — Other Ambulatory Visit: Payer: Self-pay

## 2019-06-29 DIAGNOSIS — O0990 Supervision of high risk pregnancy, unspecified, unspecified trimester: Secondary | ICD-10-CM | POA: Insufficient documentation

## 2019-06-29 DIAGNOSIS — Z3A18 18 weeks gestation of pregnancy: Secondary | ICD-10-CM

## 2019-06-29 DIAGNOSIS — O09512 Supervision of elderly primigravida, second trimester: Secondary | ICD-10-CM

## 2019-06-29 DIAGNOSIS — O09529 Supervision of elderly multigravida, unspecified trimester: Secondary | ICD-10-CM | POA: Diagnosis not present

## 2019-06-30 ENCOUNTER — Ambulatory Visit (INDEPENDENT_AMBULATORY_CARE_PROVIDER_SITE_OTHER): Payer: Medicaid Other | Admitting: Student

## 2019-06-30 ENCOUNTER — Ambulatory Visit: Payer: Medicaid Other | Admitting: Clinical

## 2019-06-30 ENCOUNTER — Encounter: Payer: Self-pay | Admitting: Student

## 2019-06-30 VITALS — BP 117/61 | HR 75 | Wt 160.0 lb

## 2019-06-30 DIAGNOSIS — O09522 Supervision of elderly multigravida, second trimester: Secondary | ICD-10-CM

## 2019-06-30 DIAGNOSIS — O99012 Anemia complicating pregnancy, second trimester: Secondary | ICD-10-CM

## 2019-06-30 DIAGNOSIS — Z3A19 19 weeks gestation of pregnancy: Secondary | ICD-10-CM

## 2019-06-30 DIAGNOSIS — Z3A20 20 weeks gestation of pregnancy: Secondary | ICD-10-CM | POA: Diagnosis not present

## 2019-06-30 DIAGNOSIS — O0992 Supervision of high risk pregnancy, unspecified, second trimester: Secondary | ICD-10-CM

## 2019-06-30 DIAGNOSIS — O34219 Maternal care for unspecified type scar from previous cesarean delivery: Secondary | ICD-10-CM

## 2019-06-30 DIAGNOSIS — O99019 Anemia complicating pregnancy, unspecified trimester: Secondary | ICD-10-CM

## 2019-06-30 DIAGNOSIS — O0990 Supervision of high risk pregnancy, unspecified, unspecified trimester: Secondary | ICD-10-CM | POA: Diagnosis not present

## 2019-06-30 DIAGNOSIS — D573 Sickle-cell trait: Secondary | ICD-10-CM

## 2019-06-30 DIAGNOSIS — Z98891 History of uterine scar from previous surgery: Secondary | ICD-10-CM

## 2019-06-30 DIAGNOSIS — O0932 Supervision of pregnancy with insufficient antenatal care, second trimester: Secondary | ICD-10-CM

## 2019-06-30 NOTE — BH Specialist Note (Signed)
Error, rescheduled.

## 2019-06-30 NOTE — Patient Instructions (Signed)
The Maternity Assessment Unit (MAU) is located at the [facility] at [facility]. The address is: [address], [address], [place], [place] [address]. Please see map below for additional directions.    The Maternity Assessment Unit is designed to help you during your pregnancy, and for up to 6 weeks after delivery, with any pregnancy- or postpartum-related emergencies, if you think you are in labor, or if your water has broken. For example, if you experience nausea and vomiting, vaginal bleeding, severe abdominal or pelvic pain, elevated blood pressure or other problems related to your pregnancy or postpartum time, please come to the Maternity Assessment Unit for assistance.   Second Trimester of Pregnancy The second trimester is from week 14 through week 27 (months 4 through 6). The second trimester is often a time when you feel your best. Your body has adjusted to being pregnant, and you begin to feel better physically. Usually, morning sickness has lessened or quit completely, you may have more energy, and you may have an increase in appetite. The second trimester is also a time when the fetus is growing rapidly. At the end of the sixth month, the fetus is about 9 inches long and weighs about 1 pounds. You will likely begin to feel the baby move (quickening) between 16 and 20 weeks of pregnancy. Body changes during your second trimester Your body continues to go through many changes during your second trimester. The changes vary from woman to woman.  Your weight will continue to increase. You will notice your lower abdomen bulging out.  You may begin to get stretch marks on your hips, abdomen, and breasts.  You may develop headaches that can be relieved by medicines. The medicines should be approved by your health care provider.  You may urinate more often because the fetus is pressing on your bladder.  You may develop or continue to have heartburn as a result  of your pregnancy.  You may develop constipation because certain hormones are causing the muscles that push waste through your intestines to slow down.  You may develop hemorrhoids or swollen, bulging veins (varicose veins).  You may have back pain. This is caused by: ? Weight gain. ? Pregnancy hormones that are relaxing the joints in your pelvis. ? A shift in weight and the muscles that support your balance.  Your breasts will continue to grow and they will continue to become tender.  Your gums may bleed and may be sensitive to brushing and flossing.  Dark spots or blotches (chloasma, mask of pregnancy) may develop on your face. This will likely fade after the baby is born.  A dark line from your belly button to the pubic area (linea nigra) may appear. This will likely fade after the baby is born.  You may have changes in your hair. These can include thickening of your hair, rapid growth, and changes in texture. Some women also have hair loss during or after pregnancy, or hair that feels dry or thin. Your hair will most likely return to normal after your baby is born. What to expect at prenatal visits During a routine prenatal visit:  You will be weighed to make sure you and the fetus are growing normally.  Your blood pressure will be taken.  Your abdomen will be measured to track your baby's growth.  The fetal heartbeat will be listened to.  Any test results from the previous visit will be discussed. Your health care provider may ask you:    you:  How you are feeling.  If you are feeling the baby move.  If you have had any abnormal symptoms, such as leaking fluid, bleeding, severe headaches, or abdominal cramping.  If you are using any tobacco products, including cigarettes, chewing tobacco, and electronic cigarettes.  If you have any questions. Other tests that may be performed during your second trimester include:  Blood tests that check for: ? Low iron levels  (anemia). ? High blood sugar that affects pregnant women (gestational diabetes) between 20 and 28 weeks. ? Rh antibodies. This is to check for a protein on red blood cells (Rh factor).  Urine tests to check for infections, diabetes, or protein in the urine.  An ultrasound to confirm the proper growth and development of the baby.  An amniocentesis to check for possible genetic problems.  Fetal screens for spina bifida and Down syndrome.  HIV (human immunodeficiency virus) testing. Routine prenatal testing includes screening for HIV, unless you choose not to have this test. Follow these instructions at home: Medicines  Follow your health care provider's instructions regarding medicine use. Specific medicines may be either safe or unsafe to take during pregnancy.  Take a prenatal vitamin that contains at least 600 micrograms (mcg) of folic acid.  If you develop constipation, try taking a stool softener if your health care provider approves. Eating and drinking   Eat a balanced diet that includes fresh fruits and vegetables, whole grains, good sources of protein such as meat, eggs, or tofu, and low-fat dairy. Your health care provider will help you determine the amount of weight gain that is right for you.  Avoid raw meat and uncooked cheese. These carry germs that can cause birth defects in the baby.  If you have low calcium intake from food, talk to your health care provider about whether you should take a daily calcium supplement.  Limit foods that are high in fat and processed sugars, such as fried and sweet foods.  To prevent constipation: ? Drink enough fluid to keep your urine clear or pale yellow. ? Eat foods that are high in fiber, such as fresh fruits and vegetables, whole grains, and beans. Activity  Exercise only as directed by your health care provider. Most women can continue their usual exercise routine during pregnancy. Try to exercise for 30 minutes at least 5 days a  week. Stop exercising if you experience uterine contractions.  Avoid heavy lifting, wear low heel shoes, and practice good posture.  A sexual relationship may be continued unless your health care provider directs you otherwise. Relieving pain and discomfort  Wear a good support bra to prevent discomfort from breast tenderness.  Take warm sitz baths to soothe any pain or discomfort caused by hemorrhoids. Use hemorrhoid cream if your health care provider approves.  Rest with your legs elevated if you have leg cramps or low back pain.  If you develop varicose veins, wear support hose. Elevate your feet for 15 minutes, 3-4 times a day. Limit salt in your diet. Prenatal Care  Write down your questions. Take them to your prenatal visits.  Keep all your prenatal visits as told by your health care provider. This is important. Safety  Wear your seat belt at all times when driving.  Make a list of emergency phone numbers, including numbers for family, friends, the hospital, and police and fire departments. General instructions  Ask your health care provider for a referral to a local prenatal education class. Begin classes no later than  of month 6 of your pregnancy.  Ask for help if you have counseling or nutritional needs during pregnancy. Your health care provider can offer advice or refer you to specialists for help with various needs.  Do not use hot tubs, steam rooms, or saunas.  Do not douche or use tampons or scented sanitary pads.  Do not cross your legs for long periods of time.  Avoid cat litter boxes and soil used by cats. These carry germs that can cause birth defects in the baby and possibly loss of the fetus by miscarriage or stillbirth.  Avoid all smoking, herbs, alcohol, and unprescribed drugs. Chemicals in these products can affect the formation and growth of the baby.  Do not use any products that contain nicotine or tobacco, such as cigarettes and e-cigarettes. If  you need help quitting, ask your health care provider.  Visit your dentist if you have not gone yet during your pregnancy. Use a soft toothbrush to brush your teeth and be gentle when you floss. Contact a health care provider if:  You have dizziness.  You have mild pelvic cramps, pelvic pressure, or nagging pain in the abdominal area.  You have persistent nausea, vomiting, or diarrhea.  You have a bad smelling vaginal discharge.  You have pain when you urinate. Get help right away if:  You have a fever.  You are leaking fluid from your vagina.  You have spotting or bleeding from your vagina.  You have severe abdominal cramping or pain.  You have rapid weight gain or weight loss.  You have shortness of breath with chest pain.  You notice sudden or extreme swelling of your face, hands, ankles, feet, or legs.  You have not felt your baby move in over an hour.  You have severe headaches that do not go away when you take medicine.  You have vision changes. Summary  The second trimester is from week 14 through week 27 (months 4 through 6). It is also a time when the fetus is growing rapidly.  Your body goes through many changes during pregnancy. The changes vary from woman to woman.  Avoid all smoking, herbs, alcohol, and unprescribed drugs. These chemicals affect the formation and growth your baby.  Do not use any tobacco products, such as cigarettes, chewing tobacco, and e-cigarettes. If you need help quitting, ask your health care provider.  Contact your health care provider if you have any questions. Keep all prenatal visits as told by your health care provider. This is important. This information is not intended to replace advice given to you by your health care provider. Make sure you discuss any questions you have with your health care provider. Document Released: 08/05/2001 Document Revised: 12/03/2018 Document Reviewed: 09/16/2016 Elsevier Patient Education  2020  Elsevier Inc.  

## 2019-06-30 NOTE — Progress Notes (Signed)
   Subjective:   Linda Duffy is a 33 y.o. G7P4024 at [redacted]w[redacted]d by midtrimester ultrasound being seen today for her first obstetrical visit.  Her obstetrical history is significant for advanced maternal age, pregnancy induced hypertension and sickle cell trait, previous c/section x4, late to care. Patient does intend to breast feed. Pregnancy history fully reviewed.  Patient reports no complaints.  HISTORY: OB History  Gravida Para Term Preterm AB Living  7 4 4 0 2 4  SAB TAB Ectopic Multiple Live Births  0 2 0 0 4    # Outcome Date GA Lbr Len/2nd Weight Sex Delivery Anes PTL Lv  7 Current           6 Term 02/17/18 [redacted]w[redacted]d  6 lb (2.722 kg) F CS-Unspec Spinal  LIV     Birth Comments: repeat c/s , anemia  5 Term 07/04/16 [redacted]w[redacted]d  7 lb 3 oz (3.26 kg) M CS-LTranv Spinal  LIV     Birth Comments: no complications except anemic     Name: COLEMAN,JEFFREY JR.     Apgar1: 9  Apgar5: 9  4 Term 05/24/15 [redacted]w[redacted]d  5 lb 14 oz (2.665 kg) F CS-Vac Spinal  LIV     Birth Comments: Normal exam.     Name: Kjos,GIRL Yaneliz     Apgar1: 9  Apgar5: 10  3 TAB 2006          2 TAB 2005          1 Term 07/04/03 [redacted]w[redacted]d  7 lb 5 oz (3.317 kg) F CS-LTranv None N LIV     Birth Comments: c/s FTP   Past Medical History:  Diagnosis Date  . Abnormal Pap smear    had cryo after 1st c-section and nl after that  . Anemia   . Depression   . 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 History of gestational hypertension   . Ovarian cyst 2012  . Pyelonephritis   . Sickle cell trait (HCC)    Past Surgical History:  Procedure Laterality Date  . CERVIX LESION DESTRUCTION     after 1st c-section and nl paps after that  . CESAREAN SECTION     x1  . CESAREAN SECTION N/A 05/24/2015   Procedure: REPEAT CESAREAN SECTION;  Surgeon: Bernard A Marshall, MD;  Location: [facility];  Service: Obstetrics;  Laterality: N/A;  . CESAREAN SECTION N/A 07/04/2016   Procedure: CESAREAN SECTION;  Surgeon: Carolyn Harraway-Smith, MD;  Location: [facility];  Service: Obstetrics;  Laterality: N/A;  . CESAREAN SECTION N/A 02/17/2018   Procedure: REPEAT CESAREAN SECTION;  Surgeon: Wouk, Noah Bedford, MD;  Location: [facility];  Service: Obstetrics;  Laterality: N/A;  . DILATION AND CURETTAGE OF UTERUS     x2 for EABs   Family History  Problem Relation Age of Onset  . Diabetes Mother   . Hypertension Mother   . Heart disease Mother    Social History   Tobacco Use  . Smoking status: Former Smoker    Packs/day: 0.25    Types: Cigarettes    Quit date: 09/18/2014    Years since quitting: 4.7  . Smokeless tobacco: Never Used  Substance Use Topics  . Alcohol use: No  . Drug use: Not Currently    Types: Marijuana    Comment: not since she found out she was pregnant   No Known Allergies Current Outpatient Medications on File Prior to Visit  Medication Sig Dispense Refill  . Blood Pressure   Monitoring (BLOOD PRESSURE KIT) DEVI 1 Device by Does not apply route as needed. 1 Device 0  . Prenatal Vit-Fe Fumarate-FA (PRENATAL VITAMINS PO) Take 1 tablet by mouth daily.    . PROBIOTIC PRODUCT PO Take 40 Units by mouth.     No current facility-administered medications on file prior to visit.       Exam   Vitals:   06/30/19 1402  BP: 117/61  Pulse: 75  Weight: 160 lb (72.6 kg)   Fetal Heart Rate (bpm): 142  Uterus:   Fundal height at umbilicus   Bony Pelvis: average  System: General: well-developed, well-nourished female in no acute distress   Skin: normal coloration and turgor, no rashes   Neurologic: oriented, normal, negative, normal mood   Extremities: normal strength, tone, and muscle mass, ROM of all joints is normal   HEENT PERRLA, extraocular movement intact and sclera clear, anicteric   Mouth/Teeth mucous membranes moist, pharynx normal without lesions and dental hygiene good   Neck supple and no masses   Cardiovascular: regular rate and rhythm   Respiratory:  no respiratory distress, normal breath  sounds   Abdomen: soft, non-tender; bowel sounds normal; no masses,  no organomegaly     Assessment:   Pregnancy: G7P4024 Patient Active Problem List   Diagnosis Date Noted  . Hx of cesarean section 06/30/2019  . Supervision of high risk pregnancy, antepartum 06/16/2019  . Advanced maternal age in multigravida 06/16/2019  . Pyelonephritis   . History of gestational hypertension   . Short interval between pregnancies affecting pregnancy, antepartum   . Depression   . Sickle cell trait in mother affecting pregnancy (HCC) 06/25/2016  . Late prenatal care affecting pregnancy in second trimester 06/02/2016     Plan:  1. Supervision of high risk pregnancy, antepartum  - Genetic Screening - Obstetric Panel, Including HIV - Culture, OB Urine - Hemoglobin A1c - AFP, Serum, Open Spina Bifida  2. Multigravida of advanced maternal age in second trimester Will be 33 at time of delivery  3. Hx of cesarean section X4, will have repeat  4. Sickle cell trait in mother affecting pregnancy (HCC) FOB negative Urine culture today  5. Late prenatal care affecting pregnancy in second trimester    Initial labs drawn. Flu vax today Continue prenatal vitamins. Genetic Screening discussed, Quad screen and NIPS: ordered. Ultrasound discussed; fetal anatomic survey: results reviewed. Problem list reviewed and updated. The nature of [facility] with multiple MDs and other Advanced Practice Providers was explained to patient; also emphasized that residents, students are part of our team. Routine obstetric precautions reviewed. Return in about 4 weeks (around 07/28/2019) for Routine OB virtual.   Erin Lawrence 6:44 PM 06/30/19   

## 2019-07-01 ENCOUNTER — Encounter: Payer: Self-pay | Admitting: Student

## 2019-07-01 LAB — OBSTETRIC PANEL, INCLUDING HIV
Antibody Screen: NEGATIVE
Basophils Absolute: 0.1 10*3/uL (ref 0.0–0.2)
Basos: 1 %
EOS (ABSOLUTE): 0.1 10*3/uL (ref 0.0–0.4)
Eos: 1 %
HIV Screen 4th Generation wRfx: NONREACTIVE
Hematocrit: 27.2 % — ABNORMAL LOW (ref 34.0–46.6)
Hemoglobin: 9 g/dL — ABNORMAL LOW (ref 11.1–15.9)
Hepatitis B Surface Ag: NEGATIVE
Immature Grans (Abs): 0 10*3/uL (ref 0.0–0.1)
Immature Granulocytes: 0 %
Lymphocytes Absolute: 2.8 10*3/uL (ref 0.7–3.1)
Lymphs: 34 %
MCH: 31 pg (ref 26.6–33.0)
MCHC: 33.1 g/dL (ref 31.5–35.7)
MCV: 94 fL (ref 79–97)
Monocytes Absolute: 0.4 10*3/uL (ref 0.1–0.9)
Monocytes: 5 %
Neutrophils Absolute: 4.9 10*3/uL (ref 1.4–7.0)
Neutrophils: 59 %
Platelets: 238 10*3/uL (ref 150–450)
RBC: 2.9 x10E6/uL — ABNORMAL LOW (ref 3.77–5.28)
RDW: 12.9 % (ref 11.7–15.4)
RPR Ser Ql: NONREACTIVE
Rh Factor: POSITIVE
Rubella Antibodies, IGG: 1.61 index (ref >0.99)
WBC: 8.2 10*3/uL (ref 3.4–10.8)

## 2019-07-01 LAB — HEMOGLOBIN A1C
Est. average glucose Bld gHb Est-mCnc: 82 mg/dL
Hgb A1c MFr Bld: 4.5 % — ABNORMAL LOW (ref 4.8–5.6)

## 2019-07-01 MED ORDER — FERROUS SULFATE 325 (65 FE) MG PO TABS: 325.0000 mg | ORAL_TABLET | Freq: Two times a day (BID) | ORAL | 3 refills | Status: DC

## 2019-07-01 NOTE — Addendum Note (Signed)
Addended by: Jorje Guild B on: 07/01/2019 09:07 PM   Modules accepted: Orders

## 2019-07-02 LAB — AFP, SERUM, OPEN SPINA BIFIDA
AFP MoM: 0.86
AFP Value: 45.2 ng/mL
Gest. Age on Collection Date: 19 weeks
Maternal Age At EDD: 35.2 yr
OSBR Risk 1 IN: 10000
Test Results:: NEGATIVE
Weight: 160 [lb_av]

## 2019-07-02 LAB — CULTURE, OB URINE

## 2019-07-02 LAB — URINE CULTURE, OB REFLEX

## 2019-07-04 ENCOUNTER — Telehealth: Payer: Self-pay | Admitting: Obstetrics and Gynecology

## 2019-07-04 MED ORDER — ASPIRIN EC 81 MG PO TBEC: 81.0000 mg | DELAYED_RELEASE_TABLET | Freq: Every day | ORAL | 1 refills | Status: DC

## 2019-07-04 NOTE — Telephone Encounter (Signed)
Reviewed patient's Anatomy US.  Patient should start 81 mg of ASA d/t history of GHTN. Unable to reach, RX sent    Dilana Mcphie, Artist Pais, NP 07/04/2019 2:46 PM

## 2019-07-11 ENCOUNTER — Other Ambulatory Visit: Payer: Self-pay

## 2019-07-11 ENCOUNTER — Encounter: Payer: Self-pay | Admitting: *Deleted

## 2019-07-11 MED ORDER — PREPLUS 27-1 MG PO TABS: 1.0000 | ORAL_TABLET | Freq: Every day | ORAL | 6 refills | Status: DC

## 2019-07-11 NOTE — Telephone Encounter (Signed)
Received notification from [facility] requesting a refill on PNV.  PNV e-prescribed.    Mel Almond, RN 07/11/19

## 2019-07-18 ENCOUNTER — Encounter: Payer: Self-pay | Admitting: *Deleted

## 2019-07-28 ENCOUNTER — Telehealth (INDEPENDENT_AMBULATORY_CARE_PROVIDER_SITE_OTHER): Payer: Medicaid Other | Admitting: Obstetrics and Gynecology

## 2019-07-28 DIAGNOSIS — O09892 Supervision of other high risk pregnancies, second trimester: Secondary | ICD-10-CM | POA: Diagnosis not present

## 2019-07-28 DIAGNOSIS — O09899 Supervision of other high risk pregnancies, unspecified trimester: Secondary | ICD-10-CM

## 2019-07-28 DIAGNOSIS — O0990 Supervision of high risk pregnancy, unspecified, unspecified trimester: Secondary | ICD-10-CM

## 2019-07-28 DIAGNOSIS — O0932 Supervision of pregnancy with insufficient antenatal care, second trimester: Secondary | ICD-10-CM | POA: Diagnosis not present

## 2019-07-28 DIAGNOSIS — Z98891 History of uterine scar from previous surgery: Secondary | ICD-10-CM

## 2019-07-28 DIAGNOSIS — O0992 Supervision of high risk pregnancy, unspecified, second trimester: Secondary | ICD-10-CM

## 2019-07-28 DIAGNOSIS — Z3A23 23 weeks gestation of pregnancy: Secondary | ICD-10-CM | POA: Diagnosis not present

## 2019-07-28 DIAGNOSIS — F3289 Other specified depressive episodes: Secondary | ICD-10-CM | POA: Diagnosis not present

## 2019-07-28 DIAGNOSIS — N12 Tubulo-interstitial nephritis, not specified as acute or chronic: Secondary | ICD-10-CM | POA: Diagnosis not present

## 2019-07-28 DIAGNOSIS — D573 Sickle-cell trait: Secondary | ICD-10-CM

## 2019-07-28 DIAGNOSIS — Z8759 Personal history of other complications of pregnancy, childbirth and the puerperium: Secondary | ICD-10-CM

## 2019-07-28 DIAGNOSIS — O99012 Anemia complicating pregnancy, second trimester: Secondary | ICD-10-CM | POA: Diagnosis not present

## 2019-07-28 DIAGNOSIS — O09522 Supervision of elderly multigravida, second trimester: Secondary | ICD-10-CM

## 2019-07-28 NOTE — Progress Notes (Signed)
   TELEHEALTH VIRTUAL OBSTETRICS VISIT ENCOUNTER NOTE  I connected with Linda Duffy on 07/28/19 at  1:55 PM EST by telephone at home and verified that I am speaking with the correct person using two identifiers.   I discussed the limitations, risks, security and privacy concerns of performing an evaluation and management service by telephone and the availability of in person appointments. I also discussed with the patient that there may be a patient responsible charge related to this service. The patient expressed understanding and agreed to proceed.  Subjective:  Linda Duffy is a 33 y.o. [id number] at [redacted]w[redacted]d being followed for ongoing prenatal care.  She is currently monitored for the following issues for this low-risk pregnancy and has Late prenatal care affecting pregnancy in second trimester; Anemia affecting pregnancy in second trimester; Sickle cell trait in mother affecting pregnancy ([place]); Supervision of high risk pregnancy, antepartum; Advanced maternal age in multigravida; Pyelonephritis; History of gestational hypertension; Short interval between pregnancies affecting pregnancy, antepartum; Depression; and Hx of cesarean section on their problem list.  Patient reports no complaints. Reports fetal movement. Denies any contractions, bleeding or leaking of fluid.   The following portions of the patient's history were reviewed and updated as appropriate: allergies, current medications, past family history, past medical history, past social history, past surgical history and problem list.   Objective:   General:  Alert, oriented and cooperative.   Mental Status: Normal mood and affect perceived. Normal judgment and thought content.  Rest of physical exam deferred due to type of encounter  Assessment and Plan:  Pregnancy: [id number] at [redacted]w[redacted]d 1. Hx of cesarean section  X 4, she should see MD at next visit to discuss C-section   2. Supervision of high risk pregnancy, antepartum  BP 122/60   3. Multigravida of advanced maternal age in second trimester   4. History of gestational hypertension  Pre E postpartum state, was on Magnesium. Start ASA   5. Anemia affecting pregnancy in second trimester  Continue iron BID CBC next visit with iron studies     Preterm labor symptoms and general obstetric precautions including but not limited to vaginal bleeding, contractions, leaking of fluid and fetal movement were reviewed in detail with the patient.  I discussed the assessment and treatment plan with the patient. The patient was provided an opportunity to ask questions and all were answered. The patient agreed with the plan and demonstrated an understanding of the instructions. The patient was advised to call back or seek an in-person office evaluation/go to MAU at [facility] for any urgent or concerning symptoms. Please refer to After Visit Summary for other counseling recommendations.   I provided 10 minutes of non-face-to-face time during this encounter.  No follow-ups on file.  No future appointments.  Noni Saupe, NP [facility] for [employer], [place]

## 2019-07-28 NOTE — Progress Notes (Signed)
I connected with  Linda Duffy on 07/28/19 at  1:55 PM EST by telephone and verified that I am speaking with the correct person using two identifiers.   I discussed the limitations, risks, security and privacy concerns of performing an evaluation and management service by telephone and the availability of in person appointments. I also discussed with the patient that there may be a patient responsible charge related to this service. The patient expressed understanding and agreed to proceed.  Golinda, CMA 07/28/2019  1:53 PM   No Concerns today.

## 2019-07-29 ENCOUNTER — Encounter: Payer: Self-pay | Admitting: *Deleted

## 2019-08-22 ENCOUNTER — Other Ambulatory Visit: Payer: Self-pay | Admitting: *Deleted

## 2019-08-22 DIAGNOSIS — O0990 Supervision of high risk pregnancy, unspecified, unspecified trimester: Secondary | ICD-10-CM

## 2019-08-25 ENCOUNTER — Encounter: Payer: Medicaid Other | Admitting: Family Medicine

## 2019-08-25 ENCOUNTER — Other Ambulatory Visit: Payer: Medicaid Other

## 2019-08-31 ENCOUNTER — Other Ambulatory Visit: Payer: Medicaid Other

## 2019-08-31 ENCOUNTER — Other Ambulatory Visit: Payer: Self-pay

## 2019-08-31 DIAGNOSIS — O0990 Supervision of high risk pregnancy, unspecified, unspecified trimester: Secondary | ICD-10-CM

## 2019-09-01 LAB — CBC
Hematocrit: 28.5 % — ABNORMAL LOW (ref 34.0–46.6)
Hemoglobin: 9.8 g/dL — ABNORMAL LOW (ref 11.1–15.9)
MCH: 32.1 pg (ref 26.6–33.0)
MCHC: 34.4 g/dL (ref 31.5–35.7)
MCV: 93 fL (ref 79–97)
Platelets: 211 10*3/uL (ref 150–450)
RBC: 3.05 x10E6/uL — ABNORMAL LOW (ref 3.77–5.28)
RDW: 12.9 % (ref 11.7–15.4)
WBC: 8.8 10*3/uL (ref 3.4–10.8)

## 2019-09-01 LAB — IRON AND TIBC
Iron Saturation: 15 % (ref 15–55)
Iron: 52 ug/dL (ref 27–159)
Total Iron Binding Capacity: 345 ug/dL (ref 250–450)
UIBC: 293 ug/dL (ref 131–425)

## 2019-09-01 LAB — FOLATE: Folate: >20 ng/mL (ref >3.0)

## 2019-09-01 LAB — RPR: RPR Ser Ql: NONREACTIVE

## 2019-09-01 LAB — GLUCOSE TOLERANCE, 2 HOURS W/ 1HR
Glucose, 1 hour: 130 mg/dL (ref 65–179)
Glucose, 2 hour: 104 mg/dL (ref 65–152)
Glucose, Fasting: 89 mg/dL (ref 65–91)

## 2019-09-01 LAB — FERRITIN: Ferritin: 69 ng/mL (ref 15–150)

## 2019-09-01 LAB — VITAMIN B12: Vitamin B-12: 382 pg/mL (ref 232–1245)

## 2019-09-01 LAB — HIV ANTIBODY (ROUTINE TESTING W REFLEX): HIV Screen 4th Generation wRfx: NONREACTIVE

## 2019-09-08 ENCOUNTER — Encounter: Payer: Medicaid Other | Admitting: Obstetrics and Gynecology

## 2019-09-14 ENCOUNTER — Other Ambulatory Visit: Payer: Self-pay

## 2019-09-14 ENCOUNTER — Ambulatory Visit (INDEPENDENT_AMBULATORY_CARE_PROVIDER_SITE_OTHER): Payer: Medicaid Other | Admitting: Family Medicine

## 2019-09-14 VITALS — BP 103/61 | HR 85 | Wt 163.0 lb

## 2019-09-14 DIAGNOSIS — O0993 Supervision of high risk pregnancy, unspecified, third trimester: Secondary | ICD-10-CM

## 2019-09-14 DIAGNOSIS — Z98891 History of uterine scar from previous surgery: Secondary | ICD-10-CM

## 2019-09-14 DIAGNOSIS — O09523 Supervision of elderly multigravida, third trimester: Secondary | ICD-10-CM

## 2019-09-14 DIAGNOSIS — Z23 Encounter for immunization: Secondary | ICD-10-CM | POA: Diagnosis not present

## 2019-09-14 DIAGNOSIS — Z3A29 29 weeks gestation of pregnancy: Secondary | ICD-10-CM

## 2019-09-14 DIAGNOSIS — O0990 Supervision of high risk pregnancy, unspecified, unspecified trimester: Secondary | ICD-10-CM

## 2019-09-14 NOTE — Progress Notes (Signed)
Pulling and stretching pain in groin

## 2019-09-14 NOTE — Patient Instructions (Signed)

## 2019-09-14 NOTE — Progress Notes (Signed)
    PRENATAL VISIT NOTE  Subjective:  Linda Duffy is a 34 y.o. G8Q7619 at [redacted]w[redacted]d being seen today for ongoing prenatal care.  She is currently monitored for the following issues for this low-risk pregnancy and has Late prenatal care affecting pregnancy in second trimester; Anemia affecting pregnancy in second trimester; Sickle cell trait in mother affecting pregnancy (HCC); Supervision of high risk pregnancy, antepartum; Advanced maternal age in multigravida; Pyelonephritis; History of gestational hypertension; Short interval between pregnancies affecting pregnancy, antepartum; Depression; and Hx of cesarean section on their problem list.  Patient reports no complaints.  Contractions: Not present. Vag. Bleeding: None.  Movement: Present. Denies leaking of fluid.   The following portions of the patient's history were reviewed and updated as appropriate: allergies, current medications, past family history, past medical history, past social history, past surgical history and problem list.   Objective:   Vitals:   09/14/19 1053  BP: 103/61  Pulse: 85  Weight: 163 lb (73.9 kg)    Fetal Status: Fetal Heart Rate (bpm): 144   Movement: Present     General:  Alert, oriented and cooperative. Patient is in no acute distress.  Skin: Skin is warm and dry. No rash noted.   Cardiovascular: Normal heart rate noted  Respiratory: Normal respiratory effort, no problems with respiration noted  Abdomen: Soft, gravid, appropriate for gestational age.  Pain/Pressure: Present     Pelvic: Cervical exam deferred        Extremities: Normal range of motion.  Edema: None  Mental Status: Normal mood and affect. Normal behavior. Normal judgment and thought content.   Assessment and Plan:  Pregnancy: [id number] at [redacted]w[redacted]d 1. Supervision of high risk pregnancy, antepartum Continue routine prenatal care.   2. Multigravida of advanced maternal age in third trimester Supposed to have f/u--scheduled - [place] MFM OB FOLLOW  UP; Future  3. Hx of cesarean section Booked for RCS--does not want BTL--risks reviewed   Preterm labor symptoms and general obstetric precautions including but not limited to vaginal bleeding, contractions, leaking of fluid and fetal movement were reviewed in detail with the patient. Please refer to After Visit Summary for other counseling recommendations.   Return in 2 weeks (on 09/28/2019) for [facility], virtual, needs U/S.  Future Appointments  Date Time Provider Department Center  09/29/2019 11:15 AM Conan Bowens, MD [facility]  09/29/2019 12:45 PM [facility] [place] 5 [facility]  09/29/2019 12:55 PM [facility] NURSE [facility]    Reva Bores, MD

## 2019-09-29 ENCOUNTER — Encounter (HOSPITAL_COMMUNITY): Payer: Self-pay

## 2019-09-29 ENCOUNTER — Other Ambulatory Visit (HOSPITAL_COMMUNITY): Payer: Self-pay | Admitting: *Deleted

## 2019-09-29 ENCOUNTER — Ambulatory Visit (HOSPITAL_COMMUNITY)
Admission: RE | Admit: 2019-09-29 | Discharge: 2019-09-29 | Disposition: A | Discharge status: Home / Self Care | Payer: Medicaid Other | Source: Ambulatory Visit | Attending: Family Medicine | Admitting: Family Medicine

## 2019-09-29 ENCOUNTER — Telehealth (INDEPENDENT_AMBULATORY_CARE_PROVIDER_SITE_OTHER): Payer: Medicaid Other | Admitting: Obstetrics and Gynecology

## 2019-09-29 ENCOUNTER — Other Ambulatory Visit: Payer: Self-pay

## 2019-09-29 ENCOUNTER — Ambulatory Visit (HOSPITAL_COMMUNITY): Payer: Medicaid Other | Admitting: *Deleted

## 2019-09-29 ENCOUNTER — Other Ambulatory Visit: Payer: Self-pay | Admitting: Family Medicine

## 2019-09-29 ENCOUNTER — Encounter: Payer: Self-pay | Admitting: Obstetrics and Gynecology

## 2019-09-29 DIAGNOSIS — O365931 Maternal care for other known or suspected poor fetal growth, third trimester, fetus 1: Secondary | ICD-10-CM | POA: Diagnosis not present

## 2019-09-29 DIAGNOSIS — O99012 Anemia complicating pregnancy, second trimester: Secondary | ICD-10-CM

## 2019-09-29 DIAGNOSIS — O0932 Supervision of pregnancy with insufficient antenatal care, second trimester: Secondary | ICD-10-CM | POA: Insufficient documentation

## 2019-09-29 DIAGNOSIS — O0993 Supervision of high risk pregnancy, unspecified, third trimester: Secondary | ICD-10-CM

## 2019-09-29 DIAGNOSIS — Z98891 History of uterine scar from previous surgery: Secondary | ICD-10-CM | POA: Diagnosis not present

## 2019-09-29 DIAGNOSIS — F3289 Other specified depressive episodes: Secondary | ICD-10-CM

## 2019-09-29 DIAGNOSIS — O09523 Supervision of elderly multigravida, third trimester: Secondary | ICD-10-CM | POA: Insufficient documentation

## 2019-09-29 DIAGNOSIS — O09899 Supervision of other high risk pregnancies, unspecified trimester: Secondary | ICD-10-CM | POA: Insufficient documentation

## 2019-09-29 DIAGNOSIS — O99019 Anemia complicating pregnancy, unspecified trimester: Secondary | ICD-10-CM | POA: Diagnosis not present

## 2019-09-29 DIAGNOSIS — D573 Sickle-cell trait: Secondary | ICD-10-CM | POA: Diagnosis not present

## 2019-09-29 DIAGNOSIS — O0990 Supervision of high risk pregnancy, unspecified, unspecified trimester: Secondary | ICD-10-CM

## 2019-09-29 DIAGNOSIS — Z8759 Personal history of other complications of pregnancy, childbirth and the puerperium: Secondary | ICD-10-CM

## 2019-09-29 DIAGNOSIS — N12 Tubulo-interstitial nephritis, not specified as acute or chronic: Secondary | ICD-10-CM | POA: Diagnosis not present

## 2019-09-29 DIAGNOSIS — Z3A32 32 weeks gestation of pregnancy: Secondary | ICD-10-CM | POA: Diagnosis not present

## 2019-09-29 DIAGNOSIS — O365990 Maternal care for other known or suspected poor fetal growth, unspecified trimester, not applicable or unspecified: Secondary | ICD-10-CM

## 2019-09-29 DIAGNOSIS — Z362 Encounter for other antenatal screening follow-up: Secondary | ICD-10-CM | POA: Diagnosis not present

## 2019-09-29 DIAGNOSIS — Z531 Procedure and treatment not carried out because of patient's decision for reasons of belief and group pressure: Secondary | ICD-10-CM | POA: Insufficient documentation

## 2019-09-29 DIAGNOSIS — O34219 Maternal care for unspecified type scar from previous cesarean delivery: Secondary | ICD-10-CM | POA: Diagnosis not present

## 2019-09-29 DIAGNOSIS — O99013 Anemia complicating pregnancy, third trimester: Secondary | ICD-10-CM

## 2019-09-29 NOTE — Progress Notes (Signed)
I connected with  Garnette Czech on 09/29/19 at 1110 by telephone and verified that I am speaking with the correct person using two identifiers.   I discussed the limitations, risks, security and privacy concerns of performing an evaluation and management service by telephone and the availability of in person appointments. I also discussed with the patient that there may be a patient responsible charge related to this service. The patient expressed understanding and agreed to proceed.  Marjo Bicker, RN 09/29/2019  11:10 AM

## 2019-09-29 NOTE — Progress Notes (Signed)
TELEHEALTH OBSTETRICS PRENATAL VIRTUAL VIDEO VISIT ENCOUNTER NOTE  Provider location: [facility] for [employer] [place]   I connected with Garnette Czech on 09/29/19 at 11:15 AM EST by MyChart Video Encounter at home and verified that I am speaking with the correct person using two identifiers.   I discussed the limitations, risks, security and privacy concerns of performing an evaluation and management service virtually and the availability of in person appointments. I also discussed with the patient that there may be a patient responsible charge related to this service. The patient expressed understanding and agreed to proceed. Subjective:  Linda Duffy is a 34 y.o. [id number] at [redacted]w[redacted]d being seen today for ongoing prenatal care.  She is currently monitored for the following issues for this high-risk pregnancy and has Late prenatal care affecting pregnancy in second trimester; Anemia affecting pregnancy in second trimester; Sickle cell trait in mother affecting pregnancy (HCC); Supervision of high risk pregnancy, antepartum; Advanced maternal age in multigravida; Pyelonephritis; History of gestational hypertension; Short interval between pregnancies affecting pregnancy, antepartum; Depression; Hx of cesarean section; and No transfusions per religious beliefs on their problem list.  Patient reports pelvic pain.  Contractions: Irritability. Vag. Bleeding: None.  Movement: Present. Denies any leaking of fluid.   The following portions of the patient's history were reviewed and updated as appropriate: allergies, current medications, past family history, past medical history, past social history, past surgical history and problem list.   Objective:   Vitals:   09/29/19 1112  BP: 132/71  Pulse: 74    Fetal Status:     Movement: Present     General:  Alert, oriented and cooperative. Patient is in no acute distress.  Respiratory: Normal respiratory effort, no problems with respiration noted   Mental Status: Normal mood and affect. Normal behavior. Normal judgment and thought content.  Rest of physical exam deferred due to type of encounter  Imaging: No results found.  Assessment and Plan:  Pregnancy: L2G4010 at [redacted]w[redacted]d  1. Hx of cesarean section RCS scheduled for 11/17/19  2. Supervision of high risk pregnancy, antepartum  3. Multigravida of advanced maternal age in third trimester  4. History of gestational hypertension Normotensive today  5. Short interval between pregnancies affecting pregnancy, antepartum  6. Other depression  7. Anemia affecting pregnancy in second trimester - Taking prenatal with iron - F/u visit in 2 weeks for repeat iron studies to see if she needs IV iron - will not accept blood products so may need IV iron transfusion  Preterm labor symptoms and general obstetric precautions including but not limited to vaginal bleeding, contractions, leaking of fluid and fetal movement were reviewed in detail with the patient. I discussed the assessment and treatment plan with the patient. The patient was provided an opportunity to ask questions and all were answered. The patient agreed with the plan and demonstrated an understanding of the instructions. The patient was advised to call back or seek an in-person office evaluation/go to [facility] at [facility] for any urgent or concerning symptoms. Please refer to After Visit Summary for other counseling recommendations.   I provided 15 minutes of face-to-face time during this encounter.  Return in about 2 weeks (around 10/13/2019) for in person, high OB, for repeat iron labs.  Future Appointments  Date Time Provider Department Center  09/29/2019 12:45 PM WH-MFC [place] 5 WH-MFCUS MFC-US  09/29/2019 12:55 PM WH-MFC NURSE WH-MFC MFC-US    Conan Bowens, MD [facility] for [facility]

## 2019-10-07 ENCOUNTER — Ambulatory Visit (HOSPITAL_COMMUNITY)
Admission: RE | Admit: 2019-10-07 | Discharge: 2019-10-07 | Disposition: A | Discharge status: Home / Self Care | Payer: Medicaid Other | Source: Ambulatory Visit | Attending: Obstetrics and Gynecology | Admitting: Obstetrics and Gynecology

## 2019-10-07 ENCOUNTER — Ambulatory Visit (HOSPITAL_COMMUNITY): Payer: Medicaid Other | Admitting: *Deleted

## 2019-10-07 ENCOUNTER — Encounter (HOSPITAL_COMMUNITY): Payer: Self-pay

## 2019-10-07 ENCOUNTER — Other Ambulatory Visit: Payer: Self-pay

## 2019-10-07 DIAGNOSIS — Z98891 History of uterine scar from previous surgery: Secondary | ICD-10-CM | POA: Diagnosis not present

## 2019-10-07 DIAGNOSIS — D573 Sickle-cell trait: Secondary | ICD-10-CM | POA: Diagnosis not present

## 2019-10-07 DIAGNOSIS — O0932 Supervision of pregnancy with insufficient antenatal care, second trimester: Secondary | ICD-10-CM | POA: Insufficient documentation

## 2019-10-07 DIAGNOSIS — O365931 Maternal care for other known or suspected poor fetal growth, third trimester, fetus 1: Secondary | ICD-10-CM | POA: Diagnosis not present

## 2019-10-07 DIAGNOSIS — O09523 Supervision of elderly multigravida, third trimester: Secondary | ICD-10-CM

## 2019-10-07 DIAGNOSIS — O365990 Maternal care for other known or suspected poor fetal growth, unspecified trimester, not applicable or unspecified: Secondary | ICD-10-CM | POA: Insufficient documentation

## 2019-10-07 DIAGNOSIS — O09899 Supervision of other high risk pregnancies, unspecified trimester: Secondary | ICD-10-CM | POA: Diagnosis not present

## 2019-10-07 DIAGNOSIS — Z3A33 33 weeks gestation of pregnancy: Secondary | ICD-10-CM

## 2019-10-07 DIAGNOSIS — O99012 Anemia complicating pregnancy, second trimester: Secondary | ICD-10-CM

## 2019-10-07 DIAGNOSIS — O99019 Anemia complicating pregnancy, unspecified trimester: Secondary | ICD-10-CM | POA: Insufficient documentation

## 2019-10-07 DIAGNOSIS — Z8759 Personal history of other complications of pregnancy, childbirth and the puerperium: Secondary | ICD-10-CM | POA: Diagnosis not present

## 2019-10-07 DIAGNOSIS — O0990 Supervision of high risk pregnancy, unspecified, unspecified trimester: Secondary | ICD-10-CM | POA: Insufficient documentation

## 2019-10-07 DIAGNOSIS — O34219 Maternal care for unspecified type scar from previous cesarean delivery: Secondary | ICD-10-CM | POA: Diagnosis not present

## 2019-10-07 DIAGNOSIS — N12 Tubulo-interstitial nephritis, not specified as acute or chronic: Secondary | ICD-10-CM | POA: Insufficient documentation

## 2019-10-13 ENCOUNTER — Encounter: Payer: Medicaid Other | Admitting: Obstetrics & Gynecology

## 2019-10-14 ENCOUNTER — Ambulatory Visit (HOSPITAL_COMMUNITY): Payer: Medicaid Other | Admitting: *Deleted

## 2019-10-14 ENCOUNTER — Ambulatory Visit (HOSPITAL_COMMUNITY)
Admission: RE | Admit: 2019-10-14 | Discharge: 2019-10-14 | Disposition: A | Discharge status: Home / Self Care | Payer: Medicaid Other | Source: Ambulatory Visit | Attending: Obstetrics and Gynecology | Admitting: Obstetrics and Gynecology

## 2019-10-14 ENCOUNTER — Other Ambulatory Visit: Payer: Self-pay

## 2019-10-14 ENCOUNTER — Encounter (HOSPITAL_COMMUNITY): Payer: Self-pay

## 2019-10-14 DIAGNOSIS — O99012 Anemia complicating pregnancy, second trimester: Secondary | ICD-10-CM | POA: Insufficient documentation

## 2019-10-14 DIAGNOSIS — O99019 Anemia complicating pregnancy, unspecified trimester: Secondary | ICD-10-CM | POA: Insufficient documentation

## 2019-10-14 DIAGNOSIS — Z3A34 34 weeks gestation of pregnancy: Secondary | ICD-10-CM | POA: Diagnosis not present

## 2019-10-14 DIAGNOSIS — D573 Sickle-cell trait: Secondary | ICD-10-CM | POA: Insufficient documentation

## 2019-10-14 DIAGNOSIS — Z8759 Personal history of other complications of pregnancy, childbirth and the puerperium: Secondary | ICD-10-CM

## 2019-10-14 DIAGNOSIS — O0990 Supervision of high risk pregnancy, unspecified, unspecified trimester: Secondary | ICD-10-CM | POA: Insufficient documentation

## 2019-10-14 DIAGNOSIS — O365990 Maternal care for other known or suspected poor fetal growth, unspecified trimester, not applicable or unspecified: Secondary | ICD-10-CM | POA: Diagnosis not present

## 2019-10-14 DIAGNOSIS — O09899 Supervision of other high risk pregnancies, unspecified trimester: Secondary | ICD-10-CM | POA: Diagnosis not present

## 2019-10-14 DIAGNOSIS — O0932 Supervision of pregnancy with insufficient antenatal care, second trimester: Secondary | ICD-10-CM

## 2019-10-14 DIAGNOSIS — Z98891 History of uterine scar from previous surgery: Secondary | ICD-10-CM | POA: Diagnosis not present

## 2019-10-14 DIAGNOSIS — N12 Tubulo-interstitial nephritis, not specified as acute or chronic: Secondary | ICD-10-CM

## 2019-10-14 DIAGNOSIS — O09523 Supervision of elderly multigravida, third trimester: Secondary | ICD-10-CM | POA: Insufficient documentation

## 2019-10-14 DIAGNOSIS — O365931 Maternal care for other known or suspected poor fetal growth, third trimester, fetus 1: Secondary | ICD-10-CM

## 2019-10-20 ENCOUNTER — Telehealth (INDEPENDENT_AMBULATORY_CARE_PROVIDER_SITE_OTHER): Payer: Medicaid Other | Admitting: Family Medicine

## 2019-10-20 ENCOUNTER — Encounter: Payer: Self-pay | Admitting: Family Medicine

## 2019-10-20 VITALS — BP 110/57 | HR 80

## 2019-10-20 DIAGNOSIS — Z98891 History of uterine scar from previous surgery: Secondary | ICD-10-CM

## 2019-10-20 DIAGNOSIS — Z3A35 35 weeks gestation of pregnancy: Secondary | ICD-10-CM | POA: Diagnosis not present

## 2019-10-20 DIAGNOSIS — O99012 Anemia complicating pregnancy, second trimester: Secondary | ICD-10-CM

## 2019-10-20 DIAGNOSIS — O99013 Anemia complicating pregnancy, third trimester: Secondary | ICD-10-CM | POA: Diagnosis not present

## 2019-10-20 DIAGNOSIS — O0933 Supervision of pregnancy with insufficient antenatal care, third trimester: Secondary | ICD-10-CM | POA: Diagnosis not present

## 2019-10-20 DIAGNOSIS — O09523 Supervision of elderly multigravida, third trimester: Secondary | ICD-10-CM

## 2019-10-20 DIAGNOSIS — O0932 Supervision of pregnancy with insufficient antenatal care, second trimester: Secondary | ICD-10-CM

## 2019-10-20 DIAGNOSIS — O0990 Supervision of high risk pregnancy, unspecified, unspecified trimester: Secondary | ICD-10-CM

## 2019-10-20 DIAGNOSIS — O34219 Maternal care for unspecified type scar from previous cesarean delivery: Secondary | ICD-10-CM

## 2019-10-20 DIAGNOSIS — O09899 Supervision of other high risk pregnancies, unspecified trimester: Secondary | ICD-10-CM

## 2019-10-20 NOTE — Progress Notes (Signed)
I connected with  Linda Duffy on 10/20/19 at 1022 by telephone and verified that I am speaking with the correct person using two identifiers.   I discussed the limitations, risks, security and privacy concerns of performing an evaluation and management service by telephone and the availability of in person appointments. I also discussed with the patient that there may be a patient responsible charge related to this service. The patient expressed understanding and agreed to proceed.  Marjo Bicker, RN 10/20/2019  10:22 AM

## 2019-10-20 NOTE — Progress Notes (Signed)
I connected with@ on 10/20/19 at 10:35 AM EST by: Mychart and verified that I am speaking with the correct person using two identifiers.  Patient is located at home and provider is located at [facility].     The purpose of this virtual visit is to provide medical care while limiting exposure to the novel coronavirus. I discussed the limitations, risks, security and privacy concerns of performing an evaluation and management service by Mychart and the availability of in person appointments. I also discussed with the patient that there may be a patient responsible charge related to this service. By engaging in this virtual visit, you consent to the provision of healthcare.  Additionally, you authorize for your insurance to be billed for the services provided during this visit.  The patient expressed understanding and agreed to proceed.  The following staff members participated in the virtual visit:  Maxwell Marion    PRENATAL VISIT NOTE  Subjective:  Mc Hollen is a 34 y.o. G2E3662 at 101w0d  for phone visit for ongoing prenatal care.  She is currently monitored for the following issues for this high-risk pregnancy and has Late prenatal care affecting pregnancy in second trimester; Anemia affecting pregnancy in second trimester; Sickle cell trait in mother affecting pregnancy (HCC); Supervision of high risk pregnancy, antepartum; Advanced maternal age in multigravida; Pyelonephritis; History of gestational hypertension; Short interval between pregnancies affecting pregnancy, antepartum; Depression; Hx of cesarean section; and No transfusions per religious beliefs on their problem list.  Patient reports no complaints.  Contractions: Irritability. Vag. Bleeding: None.  Movement: Present. Denies leaking of fluid.   The following portions of the patient's history were reviewed and updated as appropriate: allergies, current medications, past family history, past medical history, past social history, past  surgical history and problem list.   Objective:   Vitals:   10/20/19 1022  BP: (!) 110/57  Pulse: 80   Self-Obtained  Fetal Status:     Movement: Present     Assessment and Plan:  Pregnancy: H4T6546 at [redacted]w[redacted]d 1. Late prenatal care affecting pregnancy in second trimester Up to date at this point- does need pap pp  2. Short interval between pregnancies affecting pregnancy, antepartum Has 4,3,34 yo  3. Anemia affecting pregnancy in second trimester Lab Results  Component Value Date   HGB 9.8 (L) 08/31/2019   Recommend continue Fe therapy--- recheck at 36 wks Consider fe infusion given known need for CS and if hgb is still below10  4. Supervision of high risk pregnancy, antepartum Up to date Reviewed plan of care   5. Multigravida of advanced maternal age in third trimester HAS [place] for growth set up.   6. Hx of cesarean section Had scheduled RCS on 3/25 with Dr. Marice Potter  Preterm labor symptoms and general obstetric precautions including but not limited to vaginal bleeding, contractions, leaking of fluid and fetal movement were reviewed in detail with the patient.  No follow-ups on file.  Future Appointments  Date Time Provider Department Center  10/21/2019 11:00 AM WH-MFC NURSE WH-MFC MFC-US  10/21/2019 11:00 AM WH-MFC [place] 3 WH-MFCUS MFC-US  10/28/2019 11:00 AM WH-MFC NURSE WH-MFC MFC-US  10/28/2019 11:00 AM WH-MFC [place] 3 WH-MFCUS MFC-US     Time spent on virtual visit: 10 minutes  Federico Flake, MD

## 2019-10-21 ENCOUNTER — Encounter (HOSPITAL_COMMUNITY): Payer: Self-pay

## 2019-10-21 ENCOUNTER — Other Ambulatory Visit: Payer: Self-pay

## 2019-10-21 ENCOUNTER — Ambulatory Visit (HOSPITAL_COMMUNITY): Payer: Medicaid Other | Admitting: *Deleted

## 2019-10-21 ENCOUNTER — Ambulatory Visit (HOSPITAL_COMMUNITY)
Admission: RE | Admit: 2019-10-21 | Discharge: 2019-10-21 | Disposition: A | Discharge status: Home / Self Care | Payer: Medicaid Other | Source: Ambulatory Visit | Attending: Obstetrics and Gynecology | Admitting: Obstetrics and Gynecology

## 2019-10-21 DIAGNOSIS — O34219 Maternal care for unspecified type scar from previous cesarean delivery: Secondary | ICD-10-CM

## 2019-10-21 DIAGNOSIS — O365930 Maternal care for other known or suspected poor fetal growth, third trimester, not applicable or unspecified: Secondary | ICD-10-CM

## 2019-10-21 DIAGNOSIS — O0932 Supervision of pregnancy with insufficient antenatal care, second trimester: Secondary | ICD-10-CM | POA: Insufficient documentation

## 2019-10-21 DIAGNOSIS — O09899 Supervision of other high risk pregnancies, unspecified trimester: Secondary | ICD-10-CM | POA: Diagnosis present

## 2019-10-21 DIAGNOSIS — Z362 Encounter for other antenatal screening follow-up: Secondary | ICD-10-CM | POA: Diagnosis not present

## 2019-10-21 DIAGNOSIS — O09523 Supervision of elderly multigravida, third trimester: Secondary | ICD-10-CM

## 2019-10-21 DIAGNOSIS — O0990 Supervision of high risk pregnancy, unspecified, unspecified trimester: Secondary | ICD-10-CM | POA: Insufficient documentation

## 2019-10-21 DIAGNOSIS — Z8759 Personal history of other complications of pregnancy, childbirth and the puerperium: Secondary | ICD-10-CM

## 2019-10-21 DIAGNOSIS — O99012 Anemia complicating pregnancy, second trimester: Secondary | ICD-10-CM | POA: Insufficient documentation

## 2019-10-21 DIAGNOSIS — N12 Tubulo-interstitial nephritis, not specified as acute or chronic: Secondary | ICD-10-CM | POA: Insufficient documentation

## 2019-10-21 DIAGNOSIS — Z98891 History of uterine scar from previous surgery: Secondary | ICD-10-CM

## 2019-10-21 DIAGNOSIS — D573 Sickle-cell trait: Secondary | ICD-10-CM

## 2019-10-21 DIAGNOSIS — O99019 Anemia complicating pregnancy, unspecified trimester: Secondary | ICD-10-CM | POA: Insufficient documentation

## 2019-10-21 DIAGNOSIS — O365990 Maternal care for other known or suspected poor fetal growth, unspecified trimester, not applicable or unspecified: Secondary | ICD-10-CM

## 2019-10-21 DIAGNOSIS — Z3A35 35 weeks gestation of pregnancy: Secondary | ICD-10-CM | POA: Diagnosis not present

## 2019-10-28 ENCOUNTER — Ambulatory Visit (HOSPITAL_COMMUNITY)
Admission: RE | Admit: 2019-10-28 | Discharge: 2019-10-28 | Disposition: A | Discharge status: Home / Self Care | Payer: Medicaid Other | Source: Ambulatory Visit | Attending: Obstetrics and Gynecology | Admitting: Obstetrics and Gynecology

## 2019-10-28 ENCOUNTER — Other Ambulatory Visit (HOSPITAL_COMMUNITY)
Admission: RE | Admit: 2019-10-28 | Discharge: 2019-10-28 | Disposition: A | Discharge status: Home / Self Care | Payer: Medicaid Other | Source: Ambulatory Visit | Attending: Obstetrics and Gynecology | Admitting: Obstetrics and Gynecology

## 2019-10-28 ENCOUNTER — Encounter: Payer: Self-pay | Admitting: Obstetrics and Gynecology

## 2019-10-28 ENCOUNTER — Ambulatory Visit (HOSPITAL_COMMUNITY): Payer: Medicaid Other | Admitting: *Deleted

## 2019-10-28 ENCOUNTER — Ambulatory Visit (INDEPENDENT_AMBULATORY_CARE_PROVIDER_SITE_OTHER): Payer: Medicaid Other | Admitting: Obstetrics and Gynecology

## 2019-10-28 ENCOUNTER — Other Ambulatory Visit (HOSPITAL_COMMUNITY): Payer: Self-pay | Admitting: *Deleted

## 2019-10-28 ENCOUNTER — Other Ambulatory Visit: Payer: Self-pay

## 2019-10-28 ENCOUNTER — Encounter (HOSPITAL_COMMUNITY): Payer: Self-pay

## 2019-10-28 VITALS — BP 129/65 | HR 77 | Wt 161.5 lb

## 2019-10-28 DIAGNOSIS — O99019 Anemia complicating pregnancy, unspecified trimester: Secondary | ICD-10-CM

## 2019-10-28 DIAGNOSIS — Z8759 Personal history of other complications of pregnancy, childbirth and the puerperium: Secondary | ICD-10-CM | POA: Diagnosis not present

## 2019-10-28 DIAGNOSIS — O99012 Anemia complicating pregnancy, second trimester: Secondary | ICD-10-CM

## 2019-10-28 DIAGNOSIS — O09529 Supervision of elderly multigravida, unspecified trimester: Secondary | ICD-10-CM | POA: Insufficient documentation

## 2019-10-28 DIAGNOSIS — O365930 Maternal care for other known or suspected poor fetal growth, third trimester, not applicable or unspecified: Secondary | ICD-10-CM | POA: Diagnosis not present

## 2019-10-28 DIAGNOSIS — O0990 Supervision of high risk pregnancy, unspecified, unspecified trimester: Secondary | ICD-10-CM | POA: Diagnosis not present

## 2019-10-28 DIAGNOSIS — O09899 Supervision of other high risk pregnancies, unspecified trimester: Secondary | ICD-10-CM | POA: Insufficient documentation

## 2019-10-28 DIAGNOSIS — O0993 Supervision of high risk pregnancy, unspecified, third trimester: Secondary | ICD-10-CM

## 2019-10-28 DIAGNOSIS — Z98891 History of uterine scar from previous surgery: Secondary | ICD-10-CM

## 2019-10-28 DIAGNOSIS — D573 Sickle-cell trait: Secondary | ICD-10-CM | POA: Insufficient documentation

## 2019-10-28 DIAGNOSIS — Z3A36 36 weeks gestation of pregnancy: Secondary | ICD-10-CM

## 2019-10-28 DIAGNOSIS — O0932 Supervision of pregnancy with insufficient antenatal care, second trimester: Secondary | ICD-10-CM

## 2019-10-28 DIAGNOSIS — O365990 Maternal care for other known or suspected poor fetal growth, unspecified trimester, not applicable or unspecified: Secondary | ICD-10-CM

## 2019-10-28 DIAGNOSIS — O09523 Supervision of elderly multigravida, third trimester: Secondary | ICD-10-CM

## 2019-10-28 NOTE — Patient Instructions (Signed)
Cesarean Delivery, Care After This sheet gives you information about how to care for yourself after your procedure. Your health care provider may also give you more specific instructions. If you have problems or questions, contact your health care provider. What can I expect after the procedure? After the procedure, it is common to have:  A small amount of blood or clear fluid coming from the incision.  Some redness, swelling, and pain in your incision area.  Some abdominal pain and soreness.  Vaginal bleeding (lochia). Even though you did not have a vaginal delivery, you will still have vaginal bleeding and discharge.  Pelvic cramps.  Fatigue. You may have pain, swelling, and discomfort in the tissue between your vagina and your anus (perineum) if:  Your C-section was unplanned, and you were allowed to labor and push.  An incision was made in the area (episiotomy) or the tissue tore during attempted vaginal delivery. Follow these instructions at home: Incision care   Follow instructions from your health care provider about how to take care of your incision. Make sure you: ? Wash your hands with soap and water before you change your bandage (dressing). If soap and water are not available, use hand sanitizer. ? If you have a dressing, change it or remove it as told by your health care provider. ? Leave stitches (sutures), skin staples, skin glue, or adhesive strips in place. These skin closures may need to stay in place for 2 weeks or longer. If adhesive strip edges start to loosen and curl up, you may trim the loose edges. Do not remove adhesive strips completely unless your health care provider tells you to do that.  Check your incision area every day for signs of infection. Check for: ? More redness, swelling, or pain. ? More fluid or blood. ? Warmth. ? Pus or a bad smell.  Do not take baths, swim, or use a hot tub until your health care provider says it's okay. Ask your health  care provider if you can take showers.  When you cough or sneeze, hug a pillow. This helps with pain and decreases the chance of your incision opening up (dehiscing). Do this until your incision heals. Medicines  Take over-the-counter and prescription medicines only as told by your health care provider.  If you were prescribed an antibiotic medicine, take it as told by your health care provider. Do not stop taking the antibiotic even if you start to feel better.  Do not drive or use heavy machinery while taking prescription pain medicine. Lifestyle  Do not drink alcohol. This is especially important if you are breastfeeding or taking pain medicine.  Do not use any products that contain nicotine or tobacco, such as cigarettes, e-cigarettes, and chewing tobacco. If you need help quitting, ask your health care provider. Eating and drinking  Drink at least 8 eight-ounce glasses of water every day unless told not to by your health care provider. If you breastfeed, you may need to drink even more water.  Eat high-fiber foods every day. These foods may help prevent or relieve constipation. High-fiber foods include: ? Whole grain cereals and breads. ? Brown rice. ? Beans. ? Fresh fruits and vegetables. Activity   If possible, have someone help you care for your baby and help with household activities for at least a few days after you leave the hospital.  Return to your normal activities as told by your health care provider. Ask your health care provider what activities are safe for   you.  Rest as much as possible. Try to rest or take a nap while your baby is sleeping.  Do not lift anything that is heavier than 10 lbs (4.5 kg), or the limit that you were told, until your health care provider says that it is safe.  Talk with your health care provider about when you can engage in sexual activity. This may depend on your: ? Risk of infection. ? How fast you heal. ? Comfort and desire to  engage in sexual activity. General instructions  Do not use tampons or douches until your health care provider approves.  Wear loose, comfortable clothing and a supportive and well-fitting bra.  Keep your perineum clean and dry. Wipe from front to back when you use the toilet.  If you pass a blood clot, save it and call your health care provider to discuss. Do not flush blood clots down the toilet before you get instructions from your health care provider.  Keep all follow-up visits for you and your baby as told by your health care provider. This is important. Contact a health care provider if:  You have: ? A fever. ? Bad-smelling vaginal discharge. ? Pus or a bad smell coming from your incision. ? Difficulty or pain when urinating. ? A sudden increase or decrease in the frequency of your bowel movements. ? More redness, swelling, or pain around your incision. ? More fluid or blood coming from your incision. ? A rash. ? Nausea. ? Little or no interest in activities you used to enjoy. ? Questions about caring for yourself or your baby.  Your incision feels warm to the touch.  Your breasts turn red or become painful or hard.  You feel unusually sad or worried.  You vomit.  You pass a blood clot from your vagina.  You urinate more than usual.  You are dizzy or light-headed. Get help right away if:  You have: ? Pain that does not go away or get better with medicine. ? Chest pain. ? Difficulty breathing. ? Blurred vision or spots in your vision. ? Thoughts about hurting yourself or your baby. ? New pain in your abdomen or in one of your legs. ? A severe headache.  You faint.  You bleed from your vagina so much that you fill more than one sanitary pad in one hour. Bleeding should not be heavier than your heaviest period. Summary  After the procedure, it is common to have pain at your incision site, abdominal cramping, and slight bleeding from your vagina.  Check  your incision area every day for signs of infection.  Tell your health care provider about any unusual symptoms.  Keep all follow-up visits for you and your baby as told by your health care provider. This information is not intended to replace advice given to you by your health care provider. Make sure you discuss any questions you have with your health care provider. Document Revised: 02/17/2018 Document Reviewed: 02/17/2018 Elsevier Patient Education  2020 Elsevier Inc.  

## 2019-10-28 NOTE — Progress Notes (Signed)
Subjective:  Linda Duffy is a 34 y.o. [id number] at [redacted]w[redacted]d being seen today for ongoing prenatal care.  She is currently monitored for the following issues for this high-risk pregnancy and has Late prenatal care affecting pregnancy in second trimester; Anemia affecting pregnancy in second trimester; Sickle cell trait in mother affecting pregnancy (HCC); Supervision of high risk pregnancy, antepartum; Advanced maternal age in multigravida; History of gestational hypertension; Short interval between pregnancies affecting pregnancy, antepartum; Depression; Hx of cesarean section; No transfusions per religious beliefs; and IUGR (intrauterine growth restriction) affecting care of mother on their problem list.  Patient reports general discomforts of pregnancy. Fell on her back just prior to appt. Denies VB or LOF. No ut ctx.  Contractions: Irritability. Vag. Bleeding: None.  Movement: Present. Denies leaking of fluid.   The following portions of the patient's history were reviewed and updated as appropriate: allergies, current medications, past family history, past medical history, past social history, past surgical history and problem list. Problem list updated.  Objective:   Vitals:   10/28/19 1046  BP: 129/65  Pulse: 77  Weight: 161 lb 8 oz (73.3 kg)    Fetal Status: Fetal Heart Rate (bpm): 132   Movement: Present     General:  Alert, oriented and cooperative. Patient is in no acute distress.  Skin: Skin is warm and dry. No rash noted.   Cardiovascular: Normal heart rate noted  Respiratory: Normal respiratory effort, no problems with respiration noted  Abdomen: Soft, gravid, appropriate for gestational age. Pain/Pressure: Present     Pelvic:  Cervical exam deferred        Extremities: Normal range of motion.  Edema: None  Mental Status: Normal mood and affect. Normal behavior. Normal judgment and thought content.   Urinalysis:      Assessment and Plan:  Pregnancy: [id number] at [redacted]w[redacted]d  1.  Supervision of high risk pregnancy, antepartum Stable - GC/Chlamydia probe amp ([place])not at [facility] - Culture, beta strep (group b only)  2. Hx of cesarean section For repeat next week d/t IUGR  3. Multigravida of advanced maternal age in third trimester Stable  4. Poor fetal growth affecting management of mother in third trimester, single or unspecified fetus Antenatal testing and doppler studies today C section next week at 38 weeks  Term labor symptoms and general obstetric precautions including but not limited to vaginal bleeding, contractions, leaking of fluid and fetal movement were reviewed in detail with the patient. Please refer to After Visit Summary for other counseling recommendations.  No follow-ups on file.   Hermina Staggers, MD

## 2019-10-28 NOTE — Progress Notes (Signed)
Pt states slipped on steps on the way here, just fell on her butt, baby still moving ,no discharge or pain.

## 2019-10-28 NOTE — Procedures (Signed)
Linda Duffy Jan 22, 1986 [redacted]w[redacted]d  Fetus A Non-Stress Test Interpretation for 10/28/19  Indication: IUGR, 6/8 BPP  Fetal Heart Rate A Mode: External Baseline Rate (A): 130 bpm Variability: Moderate Accelerations: 15 x 15 Decelerations: None Multiple birth?: No  Uterine Activity Mode: Toco Contraction Frequency (min): none noted  Interpretation (Fetal Testing) Nonstress Test Interpretation: Reactive Comments: FHR tracing rev'd by Dr. Parke Poisson

## 2019-10-31 LAB — CULTURE, BETA STREP (GROUP B ONLY): Strep Gp B Culture: NEGATIVE

## 2019-11-01 ENCOUNTER — Encounter: Payer: Self-pay | Admitting: Obstetrics and Gynecology

## 2019-11-01 DIAGNOSIS — Z8619 Personal history of other infectious and parasitic diseases: Secondary | ICD-10-CM | POA: Insufficient documentation

## 2019-11-01 LAB — GC/CHLAMYDIA PROBE AMP (~~LOC~~) NOT AT ARMC
Chlamydia: NEGATIVE
Comment: NEGATIVE
Comment: NORMAL
Neisseria Gonorrhea: NEGATIVE

## 2019-11-02 ENCOUNTER — Telehealth (HOSPITAL_COMMUNITY): Payer: Self-pay | Admitting: *Deleted

## 2019-11-02 NOTE — Telephone Encounter (Signed)
Preadmission screen  

## 2019-11-03 ENCOUNTER — Encounter (HOSPITAL_COMMUNITY): Payer: Self-pay

## 2019-11-03 ENCOUNTER — Telehealth (INDEPENDENT_AMBULATORY_CARE_PROVIDER_SITE_OTHER): Payer: Medicaid Other | Admitting: Obstetrics & Gynecology

## 2019-11-03 ENCOUNTER — Other Ambulatory Visit: Payer: Self-pay | Admitting: Obstetrics and Gynecology

## 2019-11-03 VITALS — BP 115/54 | HR 72

## 2019-11-03 DIAGNOSIS — O0990 Supervision of high risk pregnancy, unspecified, unspecified trimester: Secondary | ICD-10-CM

## 2019-11-03 DIAGNOSIS — Z8759 Personal history of other complications of pregnancy, childbirth and the puerperium: Secondary | ICD-10-CM

## 2019-11-03 DIAGNOSIS — O09523 Supervision of elderly multigravida, third trimester: Secondary | ICD-10-CM | POA: Diagnosis not present

## 2019-11-03 DIAGNOSIS — Z3A38 38 weeks gestation of pregnancy: Secondary | ICD-10-CM

## 2019-11-03 DIAGNOSIS — Z98891 History of uterine scar from previous surgery: Secondary | ICD-10-CM | POA: Diagnosis not present

## 2019-11-03 DIAGNOSIS — D573 Sickle-cell trait: Secondary | ICD-10-CM

## 2019-11-03 DIAGNOSIS — O99012 Anemia complicating pregnancy, second trimester: Secondary | ICD-10-CM

## 2019-11-03 DIAGNOSIS — F32A Depression, unspecified: Secondary | ICD-10-CM

## 2019-11-03 DIAGNOSIS — O09893 Supervision of other high risk pregnancies, third trimester: Secondary | ICD-10-CM | POA: Diagnosis not present

## 2019-11-03 DIAGNOSIS — F329 Major depressive disorder, single episode, unspecified: Secondary | ICD-10-CM | POA: Diagnosis not present

## 2019-11-03 DIAGNOSIS — O365930 Maternal care for other known or suspected poor fetal growth, third trimester, not applicable or unspecified: Secondary | ICD-10-CM | POA: Diagnosis not present

## 2019-11-03 DIAGNOSIS — O0932 Supervision of pregnancy with insufficient antenatal care, second trimester: Secondary | ICD-10-CM

## 2019-11-03 DIAGNOSIS — O09899 Supervision of other high risk pregnancies, unspecified trimester: Secondary | ICD-10-CM

## 2019-11-03 NOTE — Patient Instructions (Signed)
Cesarean Delivery Cesarean birth, or cesarean delivery, is the surgical delivery of a baby through an incision in the abdomen and the uterus. This may be referred to as a C-section. This procedure may be scheduled ahead of time, or it may be done in an emergency situation. Tell a health care provider about:  Any allergies you have.  All medicines you are taking, including vitamins, herbs, eye drops, creams, and over-the-counter medicines.  Any problems you or family members have had with anesthetic medicines.  Any blood disorders you have.  Any surgeries you have had.  Any medical conditions you have.  Whether you or any members of your family have a history of deep vein thrombosis (DVT) or pulmonary embolism (PE). What are the risks? Generally, this is a safe procedure. However, problems may occur, including:  Infection.  Bleeding.  Allergic reactions to medicines.  Damage to other structures or organs.  Blood clots.  Injury to your baby. What happens before the procedure? General instructions  Follow instructions from your health care provider about eating or drinking restrictions.  If you know that you are going to have a cesarean delivery, do not shave your pubic area. Shaving before the procedure may increase your risk of infection.  Plan to have someone take you home from the hospital.  Ask your health care provider what steps will be taken to prevent infection. These may include: ? Removing hair at the surgery site. ? Washing skin with a germ-killing soap. ? Taking antibiotic medicine.  Depending on the reason for your cesarean delivery, you may have a physical exam or additional testing, such as an ultrasound.  You may have your blood or urine tested. Questions for your health care provider  Ask your health care provider about: ? Changing or stopping your regular medicines. This is especially important if you are taking diabetes medicines or blood  thinners. ? Your pain management plan. This is especially important if you plan to breastfeed your baby. ? How long you will be in the hospital after the procedure. ? Any concerns you may have about receiving blood products, if you need them during the procedure. ? Cord blood banking, if you plan to collect your baby's umbilical cord blood.  You may also want to ask your health care provider: ? Whether you will be able to hold or breastfeed your baby while you are still in the operating room. ? Whether your baby can stay with you immediately after the procedure and during your recovery. ? Whether a family member or a person of your choice can go with you into the operating room and stay with you during the procedure, immediately after the procedure, and during your recovery. What happens during the procedure?   An IV will be inserted into one of your veins.  Fluid and medicines, such as antibiotics, will be given before the surgery.  Fetal monitors will be placed on your abdomen to check your baby's heart rate.  You may be given a special warming gown to wear to keep your temperature stable.  A catheter may be inserted into your bladder through your urethra. This drains your urine during the procedure.  You may be given one or more of the following: ? A medicine to numb the area (local anesthetic). ? A medicine to make you fall asleep (general anesthetic). ? A medicine (regional anesthetic) that is injected into your back or through a small thin tube placed in your back (spinal anesthetic or epidural anesthetic).   This numbs everything below the injection site and allows you to stay awake during your procedure. If this makes you feel nauseous, tell your health care provider. Medicines will be available to help reduce any nausea you may feel.  An incision will be made in your abdomen, and then in your uterus.  If you are awake during your procedure, you may feel tugging and pulling in  your abdomen, but you should not feel pain. If you feel pain, tell your health care provider immediately.  Your baby will be removed from your uterus. You may feel more pressure or pushing while this happens.  Immediately after birth, your baby will be dried and kept warm. You may be able to hold and breastfeed your baby.  The umbilical cord may be clamped and cut during this time. This usually occurs after waiting a period of 1-2 minutes after delivery.  Your placenta will be removed from your uterus.  Your incisions will be closed with stitches (sutures). Staples, skin glue, or adhesive strips may also be applied to the incision in your abdomen.  Bandages (dressings) may be placed over the incision in your abdomen. The procedure may vary among health care providers and hospitals. What happens after the procedure?  Your blood pressure, heart rate, breathing rate, and blood oxygen level will be monitored until you are discharged from the hospital.  You may continue to receive fluids and medicines through an IV.  You will have some pain. Medicines will be available to help control your pain.  To help prevent blood clots: ? You may be given medicines. ? You may have to wear compression stockings or devices. ? You will be encouraged to walk around when you are able.  Hospital staff will encourage and support bonding with your baby. Your hospital may have you and your baby to stay in the same room (rooming in) during your hospital stay to encourage successful bonding and breastfeeding.  You may be encouraged to cough and breathe deeply often. This helps to prevent lung problems.  If you have a catheter draining your urine, it will be removed as soon as possible after your procedure. Summary  Cesarean birth, or cesarean delivery, is the surgical delivery of a baby through an incision in the abdomen and the uterus.  Follow instructions from your health care provider about eating or  drinking restrictions before the procedure.  You will have some pain after the procedure. Medicines will be available to help control your pain.  Hospital staff will encourage and support bonding with your baby after the procedure. Your hospital may have you and your baby to stay in the same room (rooming in) during your hospital stay to encourage successful bonding and breastfeeding. This information is not intended to replace advice given to you by your health care provider. Make sure you discuss any questions you have with your health care provider. Document Revised: 02/15/2018 Document Reviewed: 02/15/2018 Elsevier Patient Education  2020 [employer] Inc.  

## 2019-11-03 NOTE — Patient Instructions (Signed)
Linda Duffy  11/03/2019   Your procedure is scheduled on:  11/10/2019  Arrive at 1245  at Entrance C on [employer] at [facility]  and [employer]. You are invited to use the FREE valet parking or use the Visitor's parking deck.  Pick up the phone at the desk and dial [phone].  Call this number if you have problems the morning of surgery: [phone]  Remember:   Do not eat food:(After Midnight) Desps de medianoche.  Do not drink clear liquids: (After Midnight) Desps de medianoche.  Take these medicines the morning of surgery with A SIP OF WATER:  none   Do not wear jewelry, make-up or nail polish.  Do not wear lotions, powders, or perfumes. Do not wear deodorant.  Do not shave 48 hours prior to surgery.  Do not bring valuables to the hospital.  [facility] is not   responsible for any belongings or valuables brought to the hospital.  Contacts, dentures or bridgework may not be worn into surgery.  Leave suitcase in the car. After surgery it may be brought to your room.  For patients admitted to the hospital, checkout time is 11:00 AM the day of              discharge.      Please read over the following fact sheets that you were given:     Preparing for Surgery

## 2019-11-03 NOTE — Progress Notes (Signed)
I connected with  Linda Duffy on 11/03/19 at 10:15 AM EST by telephone and verified that I am speaking with the correct person using two identifiers.   I discussed the limitations, risks, security and privacy concerns of performing an evaluation and management service by telephone and the availability of in person appointments. I also discussed with the patient that there may be a patient responsible charge related to this service. The patient expressed understanding and agreed to proceed.  Henrietta Dine, CMA 11/03/2019  9:11 AM

## 2019-11-03 NOTE — Progress Notes (Signed)
TELEHEALTH VIRTUAL OBSTETRICS VISIT ENCOUNTER NOTE  I connected with Garnette Czech on 11/03/19 at 10:15 AM EST by telephone at home and verified that I am speaking with the correct person using two identifiers.   I discussed the limitations, risks, security and privacy concerns of performing an evaluation and management service by telephone and the availability of in person appointments. I also discussed with the patient that there may be a patient responsible charge related to this service. The patient expressed understanding and agreed to proceed.  Subjective:  Linda Duffy is a 34 y.o. [id number] at [redacted]w[redacted]d being followed for ongoing prenatal care.  She is currently monitored for the following issues for this high-risk pregnancy and has Late prenatal care affecting pregnancy in second trimester; Anemia affecting pregnancy in second trimester; Sickle cell trait in mother affecting pregnancy (HCC); Supervision of high risk pregnancy, antepartum; Advanced maternal age in multigravida; History of gestational hypertension; Short interval between pregnancies affecting pregnancy, antepartum; Depression; Hx of cesarean section; No transfusions per religious beliefs; IUGR (intrauterine growth restriction) affecting care of mother; and History of group B Streptococcus (GBS) infection on their problem list.  Patient reports no complaints. Reports fetal movement. Denies any contractions, bleeding or leaking of fluid.   The following portions of the patient's history were reviewed and updated as appropriate: allergies, current medications, past family history, past medical history, past social history, past surgical history and problem list.   Objective:   General:  Alert, oriented and cooperative.   Mental Status: Normal mood and affect perceived. Normal judgment and thought content.  Rest of physical exam deferred due to type of encounter  Assessment and Plan:  Pregnancy: [id number] at [redacted]w[redacted]d 1. Hx of cesarean  section Repeat at 38 week due to IUGR  2. Supervision of high risk pregnancy, antepartum BPP tomorow  3. Multigravida of advanced maternal age in third trimester   4. History of gestational hypertension BP is normal  5. Short interval between pregnancies affecting pregnancy, antepartum Does not plan sterilization, discussed added risk of multiple CS  6. Depression, unspecified depression type   7. Sickle cell trait in mother affecting pregnancy (HCC)   8. Anemia affecting pregnancy in second trimester  9. Late prenatal care affecting pregnancy in second trimester   Term labor symptoms and general obstetric precautions including but not limited to vaginal bleeding, contractions, leaking of fluid and fetal movement were reviewed in detail with the patient.  I discussed the assessment and treatment plan with the patient. The patient was provided an opportunity to ask questions and all were answered. The patient agreed with the plan and demonstrated an understanding of the instructions. The patient was advised to call back or seek an in-person office evaluation/go to MAU at [facility] for any urgent or concerning symptoms. Please refer to After Visit Summary for other counseling recommendations.   I provided 11 minutes of non-face-to-face time during this encounter.  Return if symptoms worsen or fail to improve, for postoperative.  Future Appointments  Date Time Provider Department Center  11/03/2019 10:15 AM Adam Phenix, MD [facility] WOC  11/07/2019  2:45 PM WH-MFC NURSE WH-MFC MFC-US  11/07/2019  2:45 PM WH-MFC [place] 5 WH-MFCUS MFC-US  11/08/2019  8:20 AM MC-MAU 1 MC-INDC None  11/10/2019  4:15 PM Hermina Staggers, MD [facility] WOC  11/15/2019  8:40 AM MC-MAU 1 MC-INDC None  11/18/2019  9:15 AM Anyanwu, Jethro Bastos, MD [facility] WOC    Scheryl Darter, MD [facility]

## 2019-11-07 ENCOUNTER — Ambulatory Visit (HOSPITAL_COMMUNITY): Payer: Medicaid Other | Admitting: *Deleted

## 2019-11-07 ENCOUNTER — Other Ambulatory Visit: Payer: Self-pay

## 2019-11-07 ENCOUNTER — Encounter (HOSPITAL_COMMUNITY): Payer: Self-pay | Admitting: *Deleted

## 2019-11-07 ENCOUNTER — Ambulatory Visit (HOSPITAL_COMMUNITY)
Admission: RE | Admit: 2019-11-07 | Discharge: 2019-11-07 | Disposition: A | Discharge status: Home / Self Care | Payer: Medicaid Other | Source: Ambulatory Visit | Attending: Obstetrics and Gynecology | Admitting: Obstetrics and Gynecology

## 2019-11-07 DIAGNOSIS — D573 Sickle-cell trait: Secondary | ICD-10-CM | POA: Insufficient documentation

## 2019-11-07 DIAGNOSIS — Z8759 Personal history of other complications of pregnancy, childbirth and the puerperium: Secondary | ICD-10-CM

## 2019-11-07 DIAGNOSIS — Z862 Personal history of diseases of the blood and blood-forming organs and certain disorders involving the immune mechanism: Secondary | ICD-10-CM

## 2019-11-07 DIAGNOSIS — O09899 Supervision of other high risk pregnancies, unspecified trimester: Secondary | ICD-10-CM | POA: Diagnosis not present

## 2019-11-07 DIAGNOSIS — O0990 Supervision of high risk pregnancy, unspecified, unspecified trimester: Secondary | ICD-10-CM | POA: Diagnosis not present

## 2019-11-07 DIAGNOSIS — Z98891 History of uterine scar from previous surgery: Secondary | ICD-10-CM | POA: Diagnosis not present

## 2019-11-07 DIAGNOSIS — O99012 Anemia complicating pregnancy, second trimester: Secondary | ICD-10-CM | POA: Insufficient documentation

## 2019-11-07 DIAGNOSIS — O365930 Maternal care for other known or suspected poor fetal growth, third trimester, not applicable or unspecified: Secondary | ICD-10-CM

## 2019-11-07 DIAGNOSIS — O0932 Supervision of pregnancy with insufficient antenatal care, second trimester: Secondary | ICD-10-CM | POA: Insufficient documentation

## 2019-11-07 DIAGNOSIS — O34219 Maternal care for unspecified type scar from previous cesarean delivery: Secondary | ICD-10-CM | POA: Diagnosis not present

## 2019-11-07 DIAGNOSIS — O99019 Anemia complicating pregnancy, unspecified trimester: Secondary | ICD-10-CM

## 2019-11-07 DIAGNOSIS — O09523 Supervision of elderly multigravida, third trimester: Secondary | ICD-10-CM | POA: Diagnosis not present

## 2019-11-07 DIAGNOSIS — O365990 Maternal care for other known or suspected poor fetal growth, unspecified trimester, not applicable or unspecified: Secondary | ICD-10-CM | POA: Diagnosis not present

## 2019-11-07 DIAGNOSIS — Z3A37 37 weeks gestation of pregnancy: Secondary | ICD-10-CM

## 2019-11-08 ENCOUNTER — Other Ambulatory Visit (HOSPITAL_COMMUNITY)
Admission: RE | Admit: 2019-11-08 | Discharge: 2019-11-08 | Disposition: A | Discharge status: Home / Self Care | Payer: Medicaid Other | Source: Ambulatory Visit | Attending: Obstetrics and Gynecology | Admitting: Obstetrics and Gynecology

## 2019-11-08 DIAGNOSIS — Z20822 Contact with and (suspected) exposure to covid-19: Secondary | ICD-10-CM | POA: Diagnosis not present

## 2019-11-08 LAB — CBC
HCT: 30.1 % — ABNORMAL LOW (ref 36.0–46.0)
Hemoglobin: 10.2 g/dL — ABNORMAL LOW (ref 12.0–15.0)
MCH: 32.4 pg (ref 26.0–34.0)
MCHC: 33.9 g/dL (ref 30.0–36.0)
MCV: 95.6 fL (ref 80.0–100.0)
Platelets: 234 10*3/uL (ref 150–400)
RBC: 3.15 MIL/uL — ABNORMAL LOW (ref 3.87–5.11)
RDW: 14.1 % (ref 11.5–15.5)
WBC: 6.9 10*3/uL (ref 4.0–10.5)
nRBC: 0 % (ref 0.0–0.2)

## 2019-11-08 LAB — RPR: RPR Ser Ql: NONREACTIVE

## 2019-11-08 LAB — SARS CORONAVIRUS 2 (TAT 6-24 HRS): SARS Coronavirus 2: NEGATIVE

## 2019-11-08 NOTE — MAU Note (Signed)
Asymptomatic, swab collected. Lab on unit

## 2019-11-10 ENCOUNTER — Encounter: Payer: Self-pay | Admitting: Obstetrics and Gynecology

## 2019-11-10 ENCOUNTER — Inpatient Hospital Stay (HOSPITAL_COMMUNITY)
Admission: RE | Admit: 2019-11-10 | Discharge: 2019-11-12 | DRG: 788 | Disposition: A | Discharge status: Home / Self Care | Payer: Medicaid Other | Attending: Obstetrics and Gynecology | Admitting: Obstetrics and Gynecology

## 2019-11-10 ENCOUNTER — Encounter (HOSPITAL_COMMUNITY): Payer: Self-pay | Admitting: Obstetrics and Gynecology

## 2019-11-10 ENCOUNTER — Inpatient Hospital Stay (HOSPITAL_COMMUNITY): Payer: Medicaid Other

## 2019-11-10 ENCOUNTER — Other Ambulatory Visit: Payer: Self-pay

## 2019-11-10 ENCOUNTER — Telehealth (INDEPENDENT_AMBULATORY_CARE_PROVIDER_SITE_OTHER): Payer: Medicaid Other | Admitting: Obstetrics and Gynecology

## 2019-11-10 ENCOUNTER — Encounter (HOSPITAL_COMMUNITY)
Admission: RE | Disposition: A | Discharge status: Home / Self Care | Payer: Self-pay | Source: Home / Self Care | Attending: Obstetrics and Gynecology

## 2019-11-10 DIAGNOSIS — O0990 Supervision of high risk pregnancy, unspecified, unspecified trimester: Secondary | ICD-10-CM

## 2019-11-10 DIAGNOSIS — Z3A38 38 weeks gestation of pregnancy: Secondary | ICD-10-CM

## 2019-11-10 DIAGNOSIS — Z8679 Personal history of other diseases of the circulatory system: Secondary | ICD-10-CM

## 2019-11-10 DIAGNOSIS — O09899 Supervision of other high risk pregnancies, unspecified trimester: Secondary | ICD-10-CM

## 2019-11-10 DIAGNOSIS — Z531 Procedure and treatment not carried out because of patient's decision for reasons of belief and group pressure: Secondary | ICD-10-CM

## 2019-11-10 DIAGNOSIS — O99012 Anemia complicating pregnancy, second trimester: Secondary | ICD-10-CM | POA: Diagnosis present

## 2019-11-10 DIAGNOSIS — O09529 Supervision of elderly multigravida, unspecified trimester: Secondary | ICD-10-CM

## 2019-11-10 DIAGNOSIS — O365930 Maternal care for other known or suspected poor fetal growth, third trimester, not applicable or unspecified: Secondary | ICD-10-CM | POA: Diagnosis not present

## 2019-11-10 DIAGNOSIS — O34211 Maternal care for low transverse scar from previous cesarean delivery: Principal | ICD-10-CM | POA: Diagnosis present

## 2019-11-10 DIAGNOSIS — O9902 Anemia complicating childbirth: Secondary | ICD-10-CM | POA: Diagnosis not present

## 2019-11-10 DIAGNOSIS — F329 Major depressive disorder, single episode, unspecified: Secondary | ICD-10-CM | POA: Diagnosis present

## 2019-11-10 DIAGNOSIS — O99019 Anemia complicating pregnancy, unspecified trimester: Secondary | ICD-10-CM | POA: Diagnosis present

## 2019-11-10 DIAGNOSIS — D573 Sickle-cell trait: Secondary | ICD-10-CM | POA: Diagnosis present

## 2019-11-10 DIAGNOSIS — Z87891 Personal history of nicotine dependence: Secondary | ICD-10-CM | POA: Diagnosis not present

## 2019-11-10 DIAGNOSIS — Z98891 History of uterine scar from previous surgery: Secondary | ICD-10-CM

## 2019-11-10 DIAGNOSIS — Z8759 Personal history of other complications of pregnancy, childbirth and the puerperium: Secondary | ICD-10-CM

## 2019-11-10 DIAGNOSIS — F32A Depression, unspecified: Secondary | ICD-10-CM | POA: Diagnosis present

## 2019-11-10 DIAGNOSIS — O365990 Maternal care for other known or suspected poor fetal growth, unspecified trimester, not applicable or unspecified: Secondary | ICD-10-CM | POA: Diagnosis present

## 2019-11-10 DIAGNOSIS — D649 Anemia, unspecified: Secondary | ICD-10-CM | POA: Diagnosis present

## 2019-11-10 DIAGNOSIS — O34219 Maternal care for unspecified type scar from previous cesarean delivery: Secondary | ICD-10-CM | POA: Diagnosis not present

## 2019-11-10 LAB — CBC
HCT: 29.2 % — ABNORMAL LOW (ref 36.0–46.0)
Hemoglobin: 9.9 g/dL — ABNORMAL LOW (ref 12.0–15.0)
MCH: 32.5 pg (ref 26.0–34.0)
MCHC: 33.9 g/dL (ref 30.0–36.0)
MCV: 95.7 fL (ref 80.0–100.0)
Platelets: 207 10*3/uL (ref 150–400)
RBC: 3.05 MIL/uL — ABNORMAL LOW (ref 3.87–5.11)
RDW: 14 % (ref 11.5–15.5)
WBC: 12.6 10*3/uL — ABNORMAL HIGH (ref 4.0–10.5)
nRBC: 0 % (ref 0.0–0.2)

## 2019-11-10 LAB — CREATININE, SERUM
Creatinine, Ser: 0.67 mg/dL (ref 0.44–1.00)
GFR calc Af Amer: >60 mL/min (ref >60)
GFR calc non Af Amer: >60 mL/min (ref >60)

## 2019-11-10 LAB — PREPARE RBC (CROSSMATCH)

## 2019-11-10 LAB — ABO/RH: ABO/RH(D): O POS

## 2019-11-10 SURGERY — Surgical Case
Anesthesia: Spinal | Site: Abdomen | Wound class: Clean Contaminated

## 2019-11-10 MED ORDER — CEFAZOLIN SODIUM-DEXTROSE 2-4 GM/100ML-% IV SOLN
INTRAVENOUS | Status: AC
  Filled 2019-11-10: qty 100

## 2019-11-10 MED ORDER — SODIUM CHLORIDE 0.9 % IV SOLN: INTRAVENOUS | Status: DC | PRN

## 2019-11-10 MED ORDER — ONDANSETRON HCL 4 MG/2ML IJ SOLN: 4.0000 mg | Freq: Once | INTRAMUSCULAR | Status: DC | PRN

## 2019-11-10 MED ORDER — MORPHINE SULFATE (PF) 0.5 MG/ML IJ SOLN
INTRAMUSCULAR | Status: DC | PRN
  Administered 2019-11-10: .15 mg via INTRATHECAL

## 2019-11-10 MED ORDER — OXYTOCIN 40 UNITS IN NORMAL SALINE INFUSION - SIMPLE MED
INTRAVENOUS | Status: AC
  Filled 2019-11-10: qty 1000

## 2019-11-10 MED ORDER — PHENYLEPHRINE HCL (PRESSORS) 10 MG/ML IV SOLN
INTRAVENOUS | Status: DC | PRN
  Administered 2019-11-10: 80 ug via INTRAVENOUS
  Administered 2019-11-10: 120 ug via INTRAVENOUS
  Administered 2019-11-10: 160 ug via INTRAVENOUS

## 2019-11-10 MED ORDER — DIPHENHYDRAMINE HCL 25 MG PO CAPS: 25.0000 mg | ORAL_CAPSULE | Freq: Four times a day (QID) | ORAL | Status: DC | PRN

## 2019-11-10 MED ORDER — OXYCODONE HCL 5 MG/5ML PO SOLN: 5.0000 mg | Freq: Once | ORAL | Status: DC | PRN

## 2019-11-10 MED ORDER — KETOROLAC TROMETHAMINE 30 MG/ML IJ SOLN
INTRAMUSCULAR | Status: AC
  Filled 2019-11-10: qty 1

## 2019-11-10 MED ORDER — MEPERIDINE HCL 25 MG/ML IJ SOLN: 6.2500 mg | INTRAMUSCULAR | Status: DC | PRN

## 2019-11-10 MED ORDER — FENTANYL CITRATE (PF) 100 MCG/2ML IJ SOLN
INTRAMUSCULAR | Status: AC
  Filled 2019-11-10: qty 2

## 2019-11-10 MED ORDER — ONDANSETRON HCL 4 MG/2ML IJ SOLN: 4.0000 mg | Freq: Three times a day (TID) | INTRAMUSCULAR | Status: DC | PRN

## 2019-11-10 MED ORDER — FENTANYL CITRATE (PF) 100 MCG/2ML IJ SOLN
INTRAMUSCULAR | Status: DC | PRN
  Administered 2019-11-10: 15 ug via INTRATHECAL

## 2019-11-10 MED ORDER — LACTATED RINGERS IV SOLN: INTRAVENOUS | Status: DC

## 2019-11-10 MED ORDER — SENNOSIDES-DOCUSATE SODIUM 8.6-50 MG PO TABS
2.0000 | ORAL_TABLET | ORAL | Status: DC
  Administered 2019-11-10 – 2019-11-11 (×2): 2 via ORAL
  Filled 2019-11-10 (×2): qty 2

## 2019-11-10 MED ORDER — PHENYLEPHRINE HCL-NACL 20-0.9 MG/250ML-% IV SOLN
INTRAVENOUS | Status: DC | PRN
  Administered 2019-11-10: 60 ug/min via INTRAVENOUS

## 2019-11-10 MED ORDER — SODIUM CHLORIDE 0.9 % IR SOLN
Status: DC | PRN
  Administered 2019-11-10: 1

## 2019-11-10 MED ORDER — SIMETHICONE 80 MG PO CHEW
80.0000 mg | CHEWABLE_TABLET | Freq: Three times a day (TID) | ORAL | Status: DC
  Administered 2019-11-10 – 2019-11-12 (×6): 80 mg via ORAL
  Filled 2019-11-10 (×6): qty 1

## 2019-11-10 MED ORDER — GABAPENTIN 100 MG PO CAPS
100.0000 mg | ORAL_CAPSULE | Freq: Three times a day (TID) | ORAL | Status: DC
  Administered 2019-11-10 – 2019-11-12 (×5): 100 mg via ORAL
  Filled 2019-11-10 (×5): qty 1

## 2019-11-10 MED ORDER — ENOXAPARIN SODIUM 40 MG/0.4ML ~~LOC~~ SOLN
40.0000 mg | SUBCUTANEOUS | Status: DC
  Administered 2019-11-11 – 2019-11-12 (×2): 40 mg via SUBCUTANEOUS
  Filled 2019-11-10 (×2): qty 0.4

## 2019-11-10 MED ORDER — METOCLOPRAMIDE HCL 5 MG/ML IJ SOLN
INTRAMUSCULAR | Status: DC | PRN
  Administered 2019-11-10: 10 mg via INTRAVENOUS

## 2019-11-10 MED ORDER — DIBUCAINE (PERIANAL) 1 % EX OINT: 1.0000 "application " | TOPICAL_OINTMENT | CUTANEOUS | Status: DC | PRN

## 2019-11-10 MED ORDER — OXYTOCIN 40 UNITS IN NORMAL SALINE INFUSION - SIMPLE MED
INTRAVENOUS | Status: DC | PRN
  Administered 2019-11-10: 40 [IU] via INTRAVENOUS

## 2019-11-10 MED ORDER — COCONUT OIL OIL: 1.0000 "application " | TOPICAL_OIL | Status: DC | PRN

## 2019-11-10 MED ORDER — IBUPROFEN 800 MG PO TABS
800.0000 mg | ORAL_TABLET | Freq: Three times a day (TID) | ORAL | Status: DC
  Administered 2019-11-10 – 2019-11-12 (×5): 800 mg via ORAL
  Filled 2019-11-10 (×6): qty 1

## 2019-11-10 MED ORDER — NALBUPHINE HCL 10 MG/ML IJ SOLN: 5.0000 mg | INTRAMUSCULAR | Status: DC | PRN

## 2019-11-10 MED ORDER — PHENYLEPHRINE HCL-NACL 20-0.9 MG/250ML-% IV SOLN
INTRAVENOUS | Status: AC
  Filled 2019-11-10: qty 250

## 2019-11-10 MED ORDER — ONDANSETRON HCL 4 MG/2ML IJ SOLN
INTRAMUSCULAR | Status: AC
  Filled 2019-11-10: qty 2

## 2019-11-10 MED ORDER — DEXAMETHASONE SODIUM PHOSPHATE 10 MG/ML IJ SOLN
INTRAMUSCULAR | Status: DC | PRN
  Administered 2019-11-10: 10 mg via INTRAVENOUS

## 2019-11-10 MED ORDER — DIPHENHYDRAMINE HCL 25 MG PO CAPS: 25.0000 mg | ORAL_CAPSULE | ORAL | Status: DC | PRN

## 2019-11-10 MED ORDER — METOCLOPRAMIDE HCL 5 MG/ML IJ SOLN
INTRAMUSCULAR | Status: AC
  Filled 2019-11-10: qty 2

## 2019-11-10 MED ORDER — TETANUS-DIPHTH-ACELL PERTUSSIS 5-2.5-18.5 LF-MCG/0.5 IM SUSP: 0.5000 mL | Freq: Once | INTRAMUSCULAR | Status: DC

## 2019-11-10 MED ORDER — SIMETHICONE 80 MG PO CHEW: 80.0000 mg | CHEWABLE_TABLET | ORAL | Status: DC | PRN

## 2019-11-10 MED ORDER — SCOPOLAMINE 1 MG/3DAYS TD PT72
MEDICATED_PATCH | TRANSDERMAL | Status: AC
  Filled 2019-11-10: qty 1

## 2019-11-10 MED ORDER — DIPHENHYDRAMINE HCL 50 MG/ML IJ SOLN: 12.5000 mg | INTRAMUSCULAR | Status: DC | PRN

## 2019-11-10 MED ORDER — NALBUPHINE HCL 10 MG/ML IJ SOLN: 5.0000 mg | Freq: Once | INTRAMUSCULAR | Status: DC | PRN

## 2019-11-10 MED ORDER — DEXAMETHASONE SODIUM PHOSPHATE 10 MG/ML IJ SOLN
INTRAMUSCULAR | Status: AC
  Filled 2019-11-10: qty 1

## 2019-11-10 MED ORDER — CEFAZOLIN SODIUM-DEXTROSE 2-4 GM/100ML-% IV SOLN
2.0000 g | INTRAVENOUS | Status: AC
  Administered 2019-11-10: 13:00:00 2 g via INTRAVENOUS

## 2019-11-10 MED ORDER — LACTATED RINGERS IV SOLN: INTRAVENOUS | Status: DC | PRN

## 2019-11-10 MED ORDER — NALOXONE HCL 0.4 MG/ML IJ SOLN: 0.4000 mg | INTRAMUSCULAR | Status: DC | PRN

## 2019-11-10 MED ORDER — OXYCODONE HCL 5 MG PO TABS: 5.0000 mg | ORAL_TABLET | Freq: Once | ORAL | Status: DC | PRN

## 2019-11-10 MED ORDER — WITCH HAZEL-GLYCERIN EX PADS: 1.0000 "application " | MEDICATED_PAD | CUTANEOUS | Status: DC | PRN

## 2019-11-10 MED ORDER — FENTANYL CITRATE (PF) 100 MCG/2ML IJ SOLN: 25.0000 ug | INTRAMUSCULAR | Status: DC | PRN

## 2019-11-10 MED ORDER — MORPHINE SULFATE (PF) 0.5 MG/ML IJ SOLN
INTRAMUSCULAR | Status: AC
  Filled 2019-11-10: qty 10

## 2019-11-10 MED ORDER — ONDANSETRON HCL 4 MG/2ML IJ SOLN
INTRAMUSCULAR | Status: DC | PRN
  Administered 2019-11-10: 4 mg via INTRAVENOUS

## 2019-11-10 MED ORDER — BUPIVACAINE IN DEXTROSE 0.75-8.25 % IT SOLN
INTRATHECAL | Status: DC | PRN
  Administered 2019-11-10: 2 mL via INTRATHECAL

## 2019-11-10 MED ORDER — SCOPOLAMINE 1 MG/3DAYS TD PT72
1.0000 | MEDICATED_PATCH | Freq: Once | TRANSDERMAL | Status: DC
  Administered 2019-11-10: 15:00:00 1.5 mg via TRANSDERMAL

## 2019-11-10 MED ORDER — OXYCODONE-ACETAMINOPHEN 5-325 MG PO TABS
1.0000 | ORAL_TABLET | ORAL | Status: DC | PRN
  Administered 2019-11-10 – 2019-11-11 (×2): 1 via ORAL
  Administered 2019-11-11: 2 via ORAL
  Administered 2019-11-11: 18:00:00 1 via ORAL
  Administered 2019-11-12 (×2): 2 via ORAL
  Filled 2019-11-10: qty 1
  Filled 2019-11-10 (×3): qty 2
  Filled 2019-11-10 (×2): qty 1

## 2019-11-10 MED ORDER — SIMETHICONE 80 MG PO CHEW
80.0000 mg | CHEWABLE_TABLET | ORAL | Status: DC
  Administered 2019-11-10 – 2019-11-11 (×2): 80 mg via ORAL
  Filled 2019-11-10 (×2): qty 1

## 2019-11-10 MED ORDER — STERILE WATER FOR IRRIGATION IR SOLN
Status: DC | PRN
  Administered 2019-11-10: 1

## 2019-11-10 MED ORDER — SODIUM CHLORIDE 0.9 % IV SOLN: 10.0000 mL/h | Freq: Once | INTRAVENOUS | Status: DC

## 2019-11-10 MED ORDER — MENTHOL 3 MG MT LOZG: 1.0000 | LOZENGE | OROMUCOSAL | Status: DC | PRN

## 2019-11-10 MED ORDER — SODIUM CHLORIDE 0.9% FLUSH: 3.0000 mL | INTRAVENOUS | Status: DC | PRN

## 2019-11-10 MED ORDER — OXYTOCIN 40 UNITS IN NORMAL SALINE INFUSION - SIMPLE MED
2.5000 [IU]/h | INTRAVENOUS | Status: AC
  Administered 2019-11-10: 2.5 [IU]/h via INTRAVENOUS

## 2019-11-10 MED ORDER — KETOROLAC TROMETHAMINE 30 MG/ML IJ SOLN
30.0000 mg | Freq: Once | INTRAMUSCULAR | Status: AC | PRN
  Administered 2019-11-10: 30 mg via INTRAVENOUS

## 2019-11-10 MED ORDER — NALOXONE HCL 4 MG/10ML IJ SOLN
1.0000 ug/kg/h | INTRAVENOUS | Status: DC | PRN
  Filled 2019-11-10: qty 5

## 2019-11-10 MED ORDER — PRENATAL MULTIVITAMIN CH
1.0000 | ORAL_TABLET | Freq: Every day | ORAL | Status: DC
  Filled 2019-11-10 (×2): qty 1

## 2019-11-10 SURGICAL SUPPLY — 33 items
BRR ADH 6X5 SEPRAFILM 1 SHT (MISCELLANEOUS)
CHLORAPREP W/TINT 26ML (MISCELLANEOUS) ×3 IMPLANT
CLAMP CORD UMBIL (MISCELLANEOUS) IMPLANT
DRSG OPSITE POSTOP 4X10 (GAUZE/BANDAGES/DRESSINGS) ×3 IMPLANT
ELECT REM PT RETURN 9FT ADLT (ELECTROSURGICAL) ×3
ELECTRODE REM PT RTRN 9FT ADLT (ELECTROSURGICAL) ×1 IMPLANT
EXTRACTOR VACUUM M CUP 4 TUBE (SUCTIONS) IMPLANT
EXTRACTOR VACUUM M CUP 4' TUBE (SUCTIONS)
GAUZE SPONGE 4X4 12PLY STRL LF (GAUZE/BANDAGES/DRESSINGS) ×4 IMPLANT
GLOVE BIOGEL PI IND STRL 6.5 (GLOVE) ×1 IMPLANT
GLOVE BIOGEL PI IND STRL 7.0 (GLOVE) ×1 IMPLANT
GLOVE BIOGEL PI INDICATOR 6.5 (GLOVE) ×2
GLOVE BIOGEL PI INDICATOR 7.0 (GLOVE) ×2
GLOVE SURG SS PI 6.0 STRL IVOR (GLOVE) ×3 IMPLANT
GOWN STRL REUS W/TWL LRG LVL3 (GOWN DISPOSABLE) ×6 IMPLANT
KIT ABG SYR 3ML LUER SLIP (SYRINGE) IMPLANT
NDL HYPO 25X5/8 SAFETYGLIDE (NEEDLE) IMPLANT
NEEDLE HYPO 25X5/8 SAFETYGLIDE (NEEDLE) IMPLANT
NS IRRIG 1000ML POUR BTL (IV SOLUTION) ×3 IMPLANT
PACK C SECTION WH (CUSTOM PROCEDURE TRAY) ×3 IMPLANT
PAD ABD 7.5X8 STRL (GAUZE/BANDAGES/DRESSINGS) ×2 IMPLANT
PAD OB MATERNITY 4.3X12.25 (PERSONAL CARE ITEMS) ×3 IMPLANT
PENCIL SMOKE EVAC W/HOLSTER (ELECTROSURGICAL) ×3 IMPLANT
RTRCTR C-SECT PINK 25CM LRG (MISCELLANEOUS) IMPLANT
SEPRAFILM MEMBRANE 5X6 (MISCELLANEOUS) IMPLANT
SUT PLAIN 0 NONE (SUTURE) IMPLANT
SUT PLAIN 2 0 (SUTURE) ×3
SUT PLAIN ABS 2-0 CT1 27XMFL (SUTURE) IMPLANT
SUT VIC AB 0 CT1 36 (SUTURE) ×12 IMPLANT
SUT VIC AB 4-0 KS 27 (SUTURE) ×3 IMPLANT
TOWEL OR 17X24 6PK STRL BLUE (TOWEL DISPOSABLE) ×3 IMPLANT
TRAY FOLEY W/BAG SLVR 14FR LF (SET/KITS/TRAYS/PACK) ×3 IMPLANT
WATER STERILE IRR 1000ML POUR (IV SOLUTION) ×3 IMPLANT

## 2019-11-10 NOTE — Op Note (Signed)
[name] PROCEDURE DATE: 11/10/2019  PREOPERATIVE DIAGNOSIS: Intrauterine pregnancy at  [redacted]w[redacted]d weeks gestation; previous uterine incision kerr x3 or greater  POSTOPERATIVE DIAGNOSIS: The same  PROCEDURE:     Cesarean Section  SURGEON:  Dr. Catalina Antigua  ASSISTANT: Dr. Crissie Reese  INDICATIONS: Linda Duffy is a 34 y.o. [id number] at [redacted]w[redacted]d scheduled for cesarean section secondary to previous uterine incision kerr x3 or greater.  The risks of cesarean section discussed with the patient included but were not limited to: bleeding which may require transfusion or reoperation; infection which may require antibiotics; injury to bowel, bladder, ureters or other surrounding organs; injury to the fetus; need for additional procedures including hysterectomy in the event of a life-threatening hemorrhage; placental abnormalities wth subsequent pregnancies, incisional problems, thromboembolic phenomenon and other postoperative/anesthesia complications. The patient concurred with the proposed plan, giving informed written consent for the procedure.    FINDINGS:  Viable female infant in cephalic presentation.  Apgars 8 and 9.  Clear amniotic fluid.  Intact placenta, three vessel cord.  Normal uterus, fallopian tubes and ovaries bilaterally. Dense scar tissue between muscle and fascia. Minimal intraperitoneal adhesions.  ANESTHESIA:    Spinal INTRAVENOUS FLUIDS:2300 ml ESTIMATED BLOOD LOSS: 426 ml URINE OUTPUT:  53 ml SPECIMENS: Placenta sent to L&D COMPLICATIONS: None immediate  PROCEDURE IN DETAIL:  The patient received intravenous antibiotics and had sequential compression devices applied to her lower extremities while in the preoperative area.  She was then taken to the operating room where anesthesia was induced and was found to be adequate. A foley catheter was placed into her bladder and attached to Jasn Xia gravity. She was then placed in a dorsal supine position with a leftward tilt, and prepped and  draped in a sterile manner. After an adequate timeout was performed, a Pfannenstiel skin incision was made with scalpel and carried through to the underlying layer of fascia. The fascia was incised in the midline and this incision was extended bilaterally using the Mayo scissors. Kocher clamps were applied to the superior aspect of the fascial incision and the underlying rectus muscles were dissected off bluntly. A similar process was carried out on the inferior aspect of the facial incision. The rectus muscles were separated in the midline bluntly and the peritoneum was entered bluntly. The Alexis self-retaining retractor was introduced into the abdominal cavity. Attention was turned to the lower uterine segment where a bladder flap was created, and a transverse hysterotomy was made with a scalpel and extended bilaterally bluntly. The infant was successfully delivered and delayed cord clamping was performed for 1 minute. The cord was clamped and cut and infant was handed over to awaiting neonatology team. Uterine massage was then administered and the placenta delivered intact with three-vessel cord. The uterus was cleared of clot and debris.  The hysterotomy was closed with 0 Vicryl in a running locked fashion, and an imbricating layer was also placed with a 0 Vicryl. Overall, excellent hemostasis was noted. The pelvis copiously irrigated and cleared of all clot and debris. Hemostasis was confirmed on all surfaces.  The peritoneum and the muscles were reapproximated using 0 vicryl interrupted stitches. The fascia was then closed using 0 Vicryl in a running fashion.  The subcutaneous layer was reapproximated with plain gut and the skin was closed in a subcuticular fashion using 3.0 Vicryl. The patient tolerated the procedure well. Sponge, lap, instrument and needle counts were correct x 2. She was taken to the recovery room in stable condition.    Linda Duffy  11/10/2019 1:32 PM

## 2019-11-10 NOTE — Transfer of Care (Signed)
Immediate Anesthesia Transfer of Care Note  Patient: Linda Duffy  Procedure(s) Performed: CESAREAN SECTION (N/A Abdomen)  Patient Location: PACU  Anesthesia Type:Spinal  Level of Consciousness: awake, alert  and oriented  Airway & Oxygen Therapy: Patient Spontanous Breathing  Post-op Assessment: Report given to RN and Post -op Vital signs reviewed and stable  Post vital signs: Reviewed and stable  Last Vitals:  Vitals Value Taken Time  BP    Temp    Pulse 79 11/10/19 1356  Resp 14 11/10/19 1356  SpO2 100 % 11/10/19 1356  Vitals shown include unvalidated device data.  Last Pain:  Vitals:   11/10/19 1202  TempSrc: Oral         Complications: No apparent anesthesia complications

## 2019-11-10 NOTE — Anesthesia Preprocedure Evaluation (Signed)
Anesthesia Evaluation  Patient identified by MRN, date of birth, ID band Patient awake    Reviewed: Allergy & Precautions, H&P , NPO status , Patient's Chart, lab work & pertinent test results  History of Anesthesia Complications Negative for: history of anesthetic complications  Airway Mallampati: II  TM Distance: >3 FB Neck ROM: full    Dental no notable dental hx.    Pulmonary neg pulmonary ROS, former smoker,    Pulmonary exam normal        Cardiovascular negative cardio ROS Normal cardiovascular exam     Neuro/Psych negative neurological ROS  negative psych ROS   GI/Hepatic negative GI ROS, Neg liver ROS,   Endo/Other  negative endocrine ROS  Renal/GU negative Renal ROS  negative genitourinary   Musculoskeletal   Abdominal   Peds  Hematology  (+) Blood dyscrasia, Sickle cell trait and anemia ,   Anesthesia Other Findings Prior C/S x4 Hgb 10.2, plts 234 T&S negative  Reproductive/Obstetrics (+) Pregnancy                             Anesthesia Physical Anesthesia Plan  ASA: III  Anesthesia Plan: Spinal   Post-op Pain Management:    Induction:   PONV Risk Score and Plan: 3 and Ondansetron and Treatment may vary due to age or medical condition  Airway Management Planned:   Additional Equipment:   Intra-op Plan:   Post-operative Plan:   Informed Consent: I have reviewed the patients History and Physical, chart, labs and discussed the procedure including the risks, benefits and alternatives for the proposed anesthesia with the patient or authorized representative who has indicated his/her understanding and acceptance.       Plan Discussed with:   Anesthesia Plan Comments:         Anesthesia Quick Evaluation

## 2019-11-10 NOTE — Anesthesia Postprocedure Evaluation (Signed)
Anesthesia Post Note  Patient: Oceanographer  Procedure(s) Performed: CESAREAN SECTION (N/A Abdomen)     Patient location during evaluation: PACU Anesthesia Type: Spinal Level of consciousness: oriented and awake and alert Pain management: pain level controlled Vital Signs Assessment: post-procedure vital signs reviewed and stable Respiratory status: spontaneous breathing, respiratory function stable and nonlabored ventilation Cardiovascular status: blood pressure returned to baseline and stable Postop Assessment: no headache, no backache, no apparent nausea or vomiting and spinal receding Anesthetic complications: no    Last Vitals:  Vitals:   11/10/19 1507 11/10/19 1608  BP: (!) 99/58 (!) 97/58  Pulse: 71 66  Resp: 16 18  Temp: 36.4 C 36.6 C  SpO2: 99% 100%    Last Pain:  Vitals:   11/10/19 1608  TempSrc: Oral   Pain Goal:                   Lucretia Kern

## 2019-11-10 NOTE — Progress Notes (Signed)
4:44p- Patient delivered today.

## 2019-11-10 NOTE — H&P (Signed)
Linda Duffy is a 34 y.o. female P33 at [redacted]w[redacted]d presenting for scheduled repeat cesarean section. Patient reports feeling well and is without complaints. She reports good fetal movement. She denies regular contractions, vaginal bleeding or leakage of fluid. Patient with prenatal care at [facility] complicated by previous cesarean section x 4, AMA, and recent diagnosis of IUGR.  OB History    Gravida  7   Para  4   Term  4   Preterm  0   AB  2   Living  4     SAB  0   TAB  2   Ectopic  0   Multiple  0   Live Births  4          Past Medical History:  Diagnosis Date  . Abnormal Pap smear    had cryo after 1st c-section and nl after that  . Anemia   . Depression   . 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 History of gestational hypertension   . Ovarian cyst 2012  . Pyelonephritis   . Sickle cell trait ([place])   . Vaginal Pap smear, abnormal    Past Surgical History:  Procedure Laterality Date  . CERVIX LESION DESTRUCTION     after 1st c-section and nl paps after that  . CESAREAN SECTION     x1  . CESAREAN SECTION N/A 05/24/2015   Procedure: REPEAT CESAREAN SECTION;  Surgeon: Frederico Hamman, MD;  Location: [facility];  Service: Obstetrics;  Laterality: N/A;  . CESAREAN SECTION N/A 07/04/2016   Procedure: CESAREAN SECTION;  Surgeon: Lavonia Drafts, MD;  Location: [place];  Service: Obstetrics;  Laterality: N/A;  . CESAREAN SECTION N/A 02/17/2018   Procedure: REPEAT CESAREAN SECTION;  Surgeon: Gwynne Edinger, MD;  Location: [place];  Service: Obstetrics;  Laterality: N/A;  . DILATION AND CURETTAGE OF UTERUS     x2 for EABs   Family History: family history includes Asthma in her mother; Diabetes in her mother; Heart disease in her mother; Hypertension in her mother. Social History:  reports that she quit smoking about 5 years ago. Her smoking use included cigarettes. She smoked 0.25 packs per day. She has never used smokeless tobacco.  She reports previous drug use. Drug: Marijuana. She reports that she does not drink alcohol.     Maternal Diabetes: No Genetic Screening: Normal Maternal Ultrasounds/Referrals: Normal Fetal Ultrasounds or other Referrals:  None Maternal Substance Abuse:  No Significant Maternal Medications:  None Significant Maternal Lab Results:  Group B Strep negative Other Comments:  none  Review of Systems  See pertinent in HPI History   Blood pressure 111/78, pulse (!) 105, temperature 98.1 F (36.7 C), temperature source Oral, resp. rate 18, height 5\' 7"  (1.702 m), weight 73 kg, last menstrual period 01/13/2019, SpO2 99 %, currently breastfeeding. Exam Physical Exam  GENERAL: Well-developed, well-nourished female in no acute distress.  LUNGS: Clear to auscultation bilaterally.  HEART: Regular rate and rhythm. ABDOMEN: Soft, nontender, gravid PELVIC: Not indicated EXTREMITIES: No cyanosis, clubbing, or edema, 2+ distal pulses.  Prenatal labs: ABO, Rh: --/--/O POS (03/16 3235) Antibody: NEG (03/16 0832) Rubella: 1.61 (11/05 1704) RPR: NON REACTIVE (03/16 0832)  HBsAg: Negative (11/05 1704)  HIV: Non Reactive (01/06 0919)  GBS: Negative/-- (03/05 1045)   Assessment/Plan: 34 yo P4024 at [redacted]w[redacted]d with IUGR here for scheduled repeat cesarean section - Risks, benefits and alternatives were explained including but not limited to risks of bleeding, infection and damage  to adjacent organs.  - Also discussed increased risks of hemorrhage given 5th cesarean section. Patient refuses blood products due to religious beliefs but agrees to receive blood products if it is a life threatening emergency - All questions were answered - Patient plans depo-provera for contraception   Linda Duffy 11/10/2019, 12:38 PM

## 2019-11-10 NOTE — Discharge Summary (Signed)
Postpartum Discharge Summary      Patient Name: Linda Duffy DOB: [birth year] MRN: [id number]  Date of admission: 11/10/2019 Delivering Provider: Clarnce Flock   Date of discharge: 11/12/2019  Admitting diagnosis: Encounter for maternal care for low transverse scar from repeat cesarean delivery [O34.211] Intrauterine pregnancy: [redacted]w[redacted]d    Secondary diagnosis:  Active Problems:   Anemia affecting pregnancy in second trimester   Sickle cell trait in mother affecting pregnancy ([facility]   Supervision of high risk pregnancy, antepartum   Advanced maternal age in multigravida   History of gestational hypertension   Short interval between pregnancies affecting pregnancy, antepartum   Depression   Hx of cesarean section   No transfusions per religious beliefs   IUGR (intrauterine growth restriction) affecting care of mother   Encounter for maternal care for low transverse scar from repeat cesarean delivery  Additional problems: none     Discharge diagnosis: Term Pregnancy Delivered                                                                                                Post partum procedures:none  Augmentation: n/a  Complications: None  Hospital course:  Sceduled C/S   34y.o. yo [id number] [id number] admitted to the hospital 11/10/2019 for scheduled cesarean section with the following indication:history of four prior cesareans.  Membrane Rupture Time/Date: 1:07 PM ,11/10/2019   Patient delivered a Viable infant.11/10/2019  Details of operation can be found in separate operative note.  Pateint had an uncomplicated postpartum course.  She is ambulating, tolerating a regular diet, passing flatus, and urinating well. Patient is discharged home in stable condition on  11/12/19        Delivery time: 1:08 PM    Magnesium Sulfate received: No BMZ received: No Rhophylac:N/A MMR:N/A Transfusion:No  Physical exam  Vitals:   11/11/19 0400 11/11/19 1338 11/11/19 2200 11/12/19 0525   BP: (!) 98/52 124/67 115/64 100/60  Pulse: (!) 58 63 64 63  Resp: 20 20 18 20   Temp: 98 F (36.7 C) 98.8 F (37.1 C) 99.1 F (37.3 C) 98 F (36.7 C)  TempSrc: Oral Oral Oral Oral  SpO2: 100%  99% 100%  Weight:      Height:       General: alert, cooperative and no distress Lochia: appropriate Uterine Fundus: firm, U-2 Incision: Healing well with no significant drainage, No significant erythema, Dressing is clean, dry, and intact DVT Evaluation: No evidence of DVT seen on physical exam. Negative Homan's sign. No cords or calf tenderness. No significant calf/ankle edema. Labs: Lab Results  Component Value Date   WBC 11.6 (H) 11/11/2019   HGB 8.3 (L) 11/11/2019   HCT 24.2 (L) 11/11/2019   MCV 95.3 11/11/2019   PLT 187 11/11/2019   CMP Latest Ref Rng & Units 11/10/2019  Glucose 70 - 99 mg/dL -  BUN 6 - 20 mg/dL -  Creatinine 0.44 - 1.00 mg/dL 0.67  Sodium 135 - 145 mmol/L -  Potassium 3.5 - 5.1 mmol/L -  Chloride 98 - 111 mmol/L -  CO2 22 - 32 mmol/L -  Calcium 8.9 - 10.3 mg/dL -  Total Protein 6.5 - 8.1 g/dL -  Total Bilirubin 0.3 - 1.2 mg/dL -  Alkaline Phos 38 - 126 U/L -  AST 15 - 41 U/L -  ALT 0 - 44 U/L -   Edinburgh Score: Edinburgh Postnatal Depression Scale Screening Tool 11/11/2019  I have been able to laugh and see the funny side of things. 0  I have looked forward with enjoyment to things. 0  I have blamed myself unnecessarily when things went wrong. 0  I have been anxious or worried for no good reason. 0  I have felt scared or panicky for no good reason. 0  Things have been getting on top of me. 1  I have been so unhappy that I have had difficulty sleeping. 0  I have felt sad or miserable. 0  I have been so unhappy that I have been crying. 0  The thought of harming myself has occurred to me. 0  Edinburgh Postnatal Depression Scale Total 1    Discharge instruction: per After Visit Summary and "Baby and Me Booklet".  After visit meds:  Allergies as  of 11/12/2019   No Known Allergies   - Rx for: Ibuprofen 800 mg TID prn pain  Gabapentin 100 mg TID prn pain  Percocet 5/325 mg every 4 hrs prn sever pain  Abdominal Binder      - Continue: bASA qd  FeSO4 325 mg  Preparation-H  PNV  Probiotic  Diet: routine diet  Activity: Advance as tolerated. Pelvic rest for 6 weeks.   Outpatient follow up:6 weeks Follow up Appt: Future Appointments  Date Time Provider [place]  11/29/2019  2:00 PM [place]  12/15/2019  1:15 PM Rasch, Artist Pais, NP [facility] WOC   Follow up Visit:   Please schedule this patient for Postpartum visit in: 6 weeks with the following provider: Any provider In-Person For C/S patients schedule nurse incision check in weeks 2 weeks: yes High risk pregnancy complicated by: CS x4, hx of blood clots after last CS Delivery mode:  CS Anticipated Birth Control:  Depo PP Procedures needed: Incision check  Schedule Integrated BH visit: no   Newborn Data: Live born female  Birth Weight:  6 lb 1.9 oz (2775 gm) APGAR: 8, 9  Newborn Delivery   Birth date/time: 11/10/2019 13:08:00 Delivery type: C-Section, Low Transverse Trial of labor: No C-section categorization: Repeat      Baby Feeding: Breast Disposition:home with mother   11/12/2019 Laury Deep, CNM

## 2019-11-10 NOTE — Anesthesia Procedure Notes (Signed)
Spinal  Patient location during procedure: OR Staffing Performed: anesthesiologist  Anesthesiologist: Bowen Goyal E, MD Preanesthetic Checklist Completed: patient identified, IV checked, risks and benefits discussed, surgical consent, monitors and equipment checked, pre-op evaluation and timeout performed Spinal Block Patient position: sitting Prep: DuraPrep and site prepped and draped Patient monitoring: continuous pulse ox, blood pressure and heart rate Approach: midline Location: L3-4 Injection technique: single-shot Needle Needle type: Pencan  Needle gauge: 24 G Needle length: 9 cm Additional Notes Functioning IV was confirmed and monitors were applied. Sterile prep and drape, including hand hygiene and sterile gloves were used. The patient was positioned and the spine was prepped. The skin was anesthetized with lidocaine.  Free flow of clear CSF was obtained prior to injecting local anesthetic into the CSF. The needle was carefully withdrawn. The patient tolerated the procedure well.      

## 2019-11-11 LAB — CBC
HCT: 24.2 % — ABNORMAL LOW (ref 36.0–46.0)
Hemoglobin: 8.3 g/dL — ABNORMAL LOW (ref 12.0–15.0)
MCH: 32.7 pg (ref 26.0–34.0)
MCHC: 34.3 g/dL (ref 30.0–36.0)
MCV: 95.3 fL (ref 80.0–100.0)
Platelets: 187 10*3/uL (ref 150–400)
RBC: 2.54 MIL/uL — ABNORMAL LOW (ref 3.87–5.11)
RDW: 13.9 % (ref 11.5–15.5)
WBC: 11.6 10*3/uL — ABNORMAL HIGH (ref 4.0–10.5)
nRBC: 0 % (ref 0.0–0.2)

## 2019-11-11 LAB — BIRTH TISSUE RECOVERY COLLECTION (PLACENTA DONATION)

## 2019-11-11 MED ORDER — ACETAMINOPHEN 325 MG PO TABS
650.0000 mg | ORAL_TABLET | ORAL | Status: DC | PRN
  Administered 2019-11-11: 650 mg via ORAL
  Filled 2019-11-11: qty 2

## 2019-11-11 MED ORDER — SODIUM CHLORIDE 0.9 % IV SOLN
510.0000 mg | Freq: Once | INTRAVENOUS | Status: AC
  Administered 2019-11-11: 09:00:00 510 mg via INTRAVENOUS
  Filled 2019-11-11: qty 17

## 2019-11-11 NOTE — Progress Notes (Signed)
CSW received consult for history of depression. CSW met with MOB to offer support and complete assessment.    MOB sitting up in bed holding infant, when CSW entered the room. CSW introduced self and explained reason for consult to which MOB expressed understanding. MOB pleasant and easy to engage throughout assessment. CSW inquired about MOB's mental health history and MOB acknowledged a history of depression last year as FOB was not around. MOB stated FOB is now involved and supportive. MOB denied any recent or current symptoms or concerns. 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 MOB stated she wasn't initially aware of it but has since participated in counseling and she realizes that's what it was. MOB reported she is still in contact with previous counselor and can reach out if needed. CSW provided education regarding the baby blues period vs. perinatal mood disorders, discussed treatment and gave resources for mental health follow up if concerns arise. CSW recommended self-evaluation during the postpartum time period using the New Mom Checklist from Postpartum Progress and encouraged MOB to contact a medical professional if symptoms are noted at any time. MOB did not appear to be displaying any acute mental health symptoms and denied any current SI, HI or DV. MOB reported having good support from FOB, her brothers and her father.   MOB confirmed having all essential items for infant once discharged and stated infant would be sleeping in a bassinet once home. CSW provided review of Sudden Infant Death Syndrome (SIDS) precautions and safe sleeping habits.    CSW identifies no further need for intervention and no barriers to discharge at this time.  Elijio Miles, LCSW [facility] [employer] [phone]

## 2019-11-11 NOTE — Lactation Note (Signed)
This note was copied from a baby's chart. Lactation Consultation Note  Patient Name: Linda Duffy Today's Date: 11/11/2019 Reason for consult: Initial assessment;Early term 37-38.6wks P5, 15 hour, ETI female infant, -5% weight loss and IUGR.. Mom with hx: CHTN, and C/S delivery. Per mom, infant is latching well and breastfeeding 30 minutes most feedings. Mom is experienced at breastfeeding, she breastfed her first 3 children for two years each and 4th child who is less than 2 years for 14 months. Mom does have DEBP at home and receives [facility] in [place]. LC entered room mom was doing STS, LC did not see latch at this time, per mom, infant breastfeed less than one hour prior to [facility] entering room. Mom wants to use DEBP, Mom understands to pump every 3 hours for 15 minutes, due infant having weight loss of -5% less than 24 hours LC discussed with mom pumping will help establish milk supply and she can give infant back any EBM which will help stabilize infant's weight. MLC demonstrated how to use cylinder hand pump when explaining how to use DEBP, mom easily expressed 6 mls of colostrum that she will offer after breastfeeding infant at next feeding. Mom knows to breastfeed infant according to hunger cues, 8 to 12 times within 24 hours and not exceed 3 hours without breastfeeding infant.  Mom knows to call RN or LC if she has any questions, concerns or need assistance with latching infant at breast.  Mom made aware of O/P services, breastfeeding support groups, community resources, and our phone # for post-discharge questions.  Mm 's plan: 1. Mom will latch infant at breast according to hunger cues, 8 to 12 times within 24 hours. 2. Mom will give infant back any EBM that she pumped after breastfeeding infant  based on infant's age/ hours of life.Mm has breastfeeding supplemental sheet. 3, Mom knows to call RN or LC if she needs assistance latching infant at breast.     Maternal  Data Formula Feeding for Exclusion: No Has patient been taught Hand Expression?: Yes Does the patient have breastfeeding experience prior to this delivery?: Yes  Feeding Feeding Type: Breast Fed  LATCH Score                   Interventions Interventions: Breast feeding basics reviewed;Hand express;Expressed milk  Lactation Tools Discussed/Used WIC Program: Yes Pump Review: Setup, frequency, and cleaning;Milk Storage Initiated by:: Danelle Earthly, IBCLC Date initiated:: 11/11/19   Consult Status Consult Status: Follow-up Date: 11/11/19 Follow-up type: In-patient    Danelle Earthly 11/11/2019, 5:28 AM

## 2019-11-11 NOTE — Progress Notes (Signed)
Patient states she is not dizzy and bleeding is fine. Patient told to keep SCDS on while in bed and use her incentive spirometer. Patient told to ambulate halls. Plan of  Care and pain regimen explained. Patient knows to call out for narcotics if needed. Patient told to pump every 2 to 3 hours. Patient told to supplement infant after each feeding and to keep up with all feedings today on yellow sheet. Emergency all explained. Patient told to not fall asleep with infant in  Bed and fall precautions explained.

## 2019-11-11 NOTE — Progress Notes (Addendum)
POSTPARTUM PROGRESS NOTE  Post Op Day 1  Subjective:  Linda Duffy is a 34 y.o. [id number] s/p RCS at [redacted]w[redacted]d.  She reports she is doing well. No acute events overnight. She denies any problems with ambulating, voiding or po intake. Denies nausea or vomiting.  Pain is well controlled.  Lochia is appropriate and minimal.  Objective: Blood pressure (!) 98/52, pulse (!) 58, temperature 98 F (36.7 C), temperature source Oral, resp. rate 20, height 5\' 7"  (1.702 m), weight 73 kg, last menstrual period 01/13/2019, SpO2 100 %, currently breastfeeding.  Physical Exam:  General: alert, cooperative and no distress Chest: no respiratory distress Heart:regular rate, distal pulses intact Abdomen: soft, nontender Uterine Fundus: firm, appropriately tender DVT Evaluation: No calf swelling or tenderness Extremities: no edema Skin: warm, dry  Recent Labs    11/10/19 1636 11/11/19 0508  HGB 9.9* 8.3*  HCT 29.2* 24.2*    Assessment/Plan: Linda Duffy is a 34 y.o. 31 s/p RCS at [redacted]w[redacted]d   POD#1 - Doing well. Continue routine postpartum care.  Contraception: Depo Feeding: Breast Anemia: Hgb 10.2>9.9>8.3. Patient is a [redacted]w[redacted]d witness and does not want blood products. She is agreeable with IV iron so IV Feraheme was ordered. Dispo: Plan for discharge in 1-2 days.   LOS: 1 day    TEFL teacher MD, PGY-1 OBGYN Faculty Teaching Service  11/11/2019, 7:36 AM  GME ATTESTATION:  I saw and evaluated the patient. I agree with the findings and the plan of care as documented in the resident's note.  11/13/2019, DO OB Fellow, Faculty [facility], Center for [facility] 11/11/2019 7:56 AM

## 2019-11-12 DIAGNOSIS — D649 Anemia, unspecified: Secondary | ICD-10-CM | POA: Diagnosis present

## 2019-11-12 LAB — TYPE AND SCREEN
ABO/RH(D): O POS
Antibody Screen: NEGATIVE
Unit division: 0
Unit division: 0

## 2019-11-12 LAB — BPAM RBC
Blood Product Expiration Date: 202104222359
Blood Product Expiration Date: 202104222359
Unit Type and Rh: 5100
Unit Type and Rh: 5100

## 2019-11-12 MED ORDER — OXYCODONE-ACETAMINOPHEN 5-325 MG PO TABS: 1.0000 | ORAL_TABLET | ORAL | 0 refills | Status: DC | PRN

## 2019-11-12 MED ORDER — GABAPENTIN 100 MG PO CAPS: 100.0000 mg | ORAL_CAPSULE | Freq: Three times a day (TID) | ORAL | 0 refills | Status: DC

## 2019-11-12 MED ORDER — IBUPROFEN 800 MG PO TABS: 800.0000 mg | ORAL_TABLET | Freq: Three times a day (TID) | ORAL | 0 refills | Status: DC

## 2019-11-12 NOTE — Plan of Care (Signed)
Patient Appropriate for discharge  ?

## 2019-11-15 ENCOUNTER — Other Ambulatory Visit (HOSPITAL_COMMUNITY): Admission: RE | Admit: 2019-11-15 | Payer: Medicaid Other | Source: Ambulatory Visit

## 2019-11-17 ENCOUNTER — Inpatient Hospital Stay (HOSPITAL_COMMUNITY)
Admission: RE | Admit: 2019-11-17 | Payer: Medicaid Other | Source: Home / Self Care | Admitting: Obstetrics & Gynecology

## 2019-11-17 ENCOUNTER — Encounter (HOSPITAL_COMMUNITY): Admission: RE | Payer: Self-pay | Source: Home / Self Care

## 2019-11-17 SURGERY — Surgical Case
Anesthesia: Regional

## 2019-11-18 ENCOUNTER — Telehealth: Payer: Medicaid Other | Admitting: Obstetrics & Gynecology

## 2019-11-29 ENCOUNTER — Other Ambulatory Visit: Payer: Self-pay

## 2019-11-29 ENCOUNTER — Ambulatory Visit (INDEPENDENT_AMBULATORY_CARE_PROVIDER_SITE_OTHER): Payer: Medicaid Other

## 2019-11-29 VITALS — BP 131/78 | HR 89 | Wt 163.0 lb

## 2019-11-29 DIAGNOSIS — Z5189 Encounter for other specified aftercare: Secondary | ICD-10-CM

## 2019-11-29 NOTE — Progress Notes (Signed)
Pt here today for incision check s/p 5th c-section on 11/10/19.  Pt has some mild pain that she takes Ibuprofen for and is effective.  Incision well approximated- no odor, no drainage, mild edema at the incision site, and no erythema.  Pt advised to continue to monitor for sx's of infection.  Verified with pt her pp appt scheduled on 12/15/19. Pt verbalized understanding with no further questions.   Addison Naegeli, RN  11/29/19

## 2019-11-29 NOTE — Progress Notes (Signed)
Patient seen and assessed by nursing staff during this encounter. I have reviewed the chart and agree with the documentation and plan. I have also made any necessary editorial changes.  Cherre Robins, CNM 11/29/2019 3:25 PM

## 2019-12-15 ENCOUNTER — Encounter: Payer: Self-pay | Admitting: Obstetrics and Gynecology

## 2019-12-15 ENCOUNTER — Other Ambulatory Visit (HOSPITAL_COMMUNITY)
Admission: RE | Admit: 2019-12-15 | Discharge: 2019-12-15 | Disposition: A | Discharge status: Home / Self Care | Payer: Medicaid Other | Source: Ambulatory Visit | Attending: Obstetrics and Gynecology | Admitting: Obstetrics and Gynecology

## 2019-12-15 ENCOUNTER — Ambulatory Visit (INDEPENDENT_AMBULATORY_CARE_PROVIDER_SITE_OTHER): Payer: Medicaid Other | Admitting: Obstetrics and Gynecology

## 2019-12-15 ENCOUNTER — Other Ambulatory Visit: Payer: Self-pay

## 2019-12-15 MED ORDER — MEDROXYPROGESTERONE ACETATE 150 MG/ML IM SUSP
150.0000 mg | Freq: Once | INTRAMUSCULAR | Status: AC
  Administered 2020-03-13: 150 mg via INTRAMUSCULAR

## 2019-12-15 NOTE — Progress Notes (Signed)
    Post Partum Visit Note  Linda Duffy is a 34 y.o. [phone] female who presents for a postpartum visit. She is 4 weeks postpartum following a Health visitor.  I have fully reviewed the prenatal and intrapartum course. The delivery was at 38w gestational weeks.  Anesthesia: spinal. Postpartum course has been unremarkable. Baby is doing well. Baby is feeding by breast. Bleeding staining only. Bowel function is normal. Bladder function is normal. Patient is not sexually active. Contraception method is Depo-Provera injections. Postpartum depression screening: Negative  The following portions of the patient's history were reviewed and updated as appropriate: allergies, current medications, past family history, past medical history, past social history, past surgical history and problem list.  Review of Systems Pertinent items are noted in HPI.    Objective:  currently breastfeeding.  General:  alert, cooperative and appears stated age  Abdomen: soft, non-tender; bowel sounds normal; no masses,  no organomegaly   Vulva:  normal  Vagina: normal vagina  Cervix:  nulliparous appearance  Corpus: not examined  Adnexa:  not evaluated  Rectal Exam: Not performed.        Assessment:   Normal postpartum exam. Pap smear at today's visit.  Depo injection. Discussed LARC as most effective form of BC PAP collected today.   Plan:   Essential components of care per ACOG recommendations:  1.  Mood and well being: Patient with negative depression screening today. Reviewed local resources for support.  - Patient does not use tobacco. If using tobacco we discussed reduction and for recently cessation risk of relapse - hx of drug use? No    2. Infant care and feeding:  -Patient currently breastmilk feeding? Yes, If breastmilk feeding discussed return to work and pumping. If needed, patient was provided letter for work to allow for every 2-3 hr pumping breaks, and to be granted a private location to  express breastmilk and refrigerated area to store breastmilk. Reviewed importance of draining breast regularly to support lactation. -Social determinants of health (SDOH) reviewed in EPIC. The following needs were identified None  3. Sexuality, contraception and birth spacing - Patient does not want a pregnancy in the next year.  Desired family size is 6 children.  - Reviewed forms of contraception in tiered fashion. Patient desired Depo-Provera today.   - Discussed birth spacing of 18 months  4. Sleep and fatigue -Encouraged family/partner/community support of 4 hrs of uninterrupted sleep to help with mood and fatigue  5. Physical Recovery  - Discussed patients delivery and complications - Patient had a C-section. - Patient has urinary incontinence? No, Patient was referred to pelvic floor PT  - Patient is safe to resume physical and sexual activity  6.  Health Maintenance - Last pap smear done 2016 and was normal with negative HPV.   7. Chronic Disease: refill for gabapentin d/t incisional burning. Will refill 1x then she needs to f/u with PCP.  - PCP follow up  Kinberly Perris, Harolyn Rutherford, NP 12/15/2019 2:24 PM    Ernestina Patches, CMA Center for [facility], [facility]

## 2019-12-16 LAB — CYTOLOGY - PAP
Adequacy: ABSENT
Chlamydia: NEGATIVE
Comment: NEGATIVE
Comment: NEGATIVE
Comment: NORMAL
Diagnosis: NEGATIVE
High risk HPV: NEGATIVE
Neisseria Gonorrhea: NEGATIVE

## 2020-02-01 ENCOUNTER — Other Ambulatory Visit: Payer: Self-pay

## 2020-02-01 ENCOUNTER — Emergency Department (HOSPITAL_COMMUNITY)
Admission: EM | Admit: 2020-02-01 | Discharge: 2020-02-01 | Disposition: A | Discharge status: Home / Self Care | Payer: Medicaid Other | Attending: Emergency Medicine | Admitting: Emergency Medicine

## 2020-02-01 ENCOUNTER — Emergency Department (HOSPITAL_COMMUNITY): Payer: Medicaid Other

## 2020-02-01 DIAGNOSIS — N200 Calculus of kidney: Secondary | ICD-10-CM | POA: Diagnosis not present

## 2020-02-01 DIAGNOSIS — N12 Tubulo-interstitial nephritis, not specified as acute or chronic: Secondary | ICD-10-CM | POA: Diagnosis not present

## 2020-02-01 DIAGNOSIS — Z87891 Personal history of nicotine dependence: Secondary | ICD-10-CM | POA: Insufficient documentation

## 2020-02-01 DIAGNOSIS — M545 Low back pain: Secondary | ICD-10-CM | POA: Diagnosis not present

## 2020-02-01 DIAGNOSIS — N10 Acute tubulo-interstitial nephritis: Secondary | ICD-10-CM | POA: Diagnosis not present

## 2020-02-01 DIAGNOSIS — R6883 Chills (without fever): Secondary | ICD-10-CM | POA: Diagnosis not present

## 2020-02-01 DIAGNOSIS — Z7982 Long term (current) use of aspirin: Secondary | ICD-10-CM | POA: Diagnosis not present

## 2020-02-01 DIAGNOSIS — R102 Pelvic and perineal pain: Secondary | ICD-10-CM | POA: Diagnosis not present

## 2020-02-01 DIAGNOSIS — R300 Dysuria: Secondary | ICD-10-CM | POA: Diagnosis present

## 2020-02-01 DIAGNOSIS — R8290 Unspecified abnormal findings in urine: Secondary | ICD-10-CM | POA: Diagnosis not present

## 2020-02-01 DIAGNOSIS — R61 Generalized hyperhidrosis: Secondary | ICD-10-CM | POA: Diagnosis not present

## 2020-02-01 LAB — COMPREHENSIVE METABOLIC PANEL
ALT: 17 U/L (ref 0–44)
AST: 18 U/L (ref 15–41)
Albumin: 4.3 g/dL (ref 3.5–5.0)
Alkaline Phosphatase: 109 U/L (ref 38–126)
Anion gap: 13 (ref 5–15)
BUN: 6 mg/dL (ref 6–20)
CO2: 20 mmol/L — ABNORMAL LOW (ref 22–32)
Calcium: 9.5 mg/dL (ref 8.9–10.3)
Chloride: 105 mmol/L (ref 98–111)
Creatinine, Ser: 0.94 mg/dL (ref 0.44–1.00)
GFR calc Af Amer: >60 mL/min (ref >60)
GFR calc non Af Amer: >60 mL/min (ref >60)
Glucose, Bld: 102 mg/dL — ABNORMAL HIGH (ref 70–99)
Potassium: 3.9 mmol/L (ref 3.5–5.1)
Sodium: 138 mmol/L (ref 135–145)
Total Bilirubin: 1.3 mg/dL — ABNORMAL HIGH (ref 0.3–1.2)
Total Protein: 8.8 g/dL — ABNORMAL HIGH (ref 6.5–8.1)

## 2020-02-01 LAB — I-STAT BETA HCG BLOOD, ED (MC, WL, AP ONLY): I-stat hCG, quantitative: <5 m[IU]/mL (ref <5)

## 2020-02-01 LAB — CBC WITH DIFFERENTIAL/PLATELET
Abs Immature Granulocytes: 0.03 10*3/uL (ref 0.00–0.07)
Basophils Absolute: 0.1 10*3/uL (ref 0.0–0.1)
Basophils Relative: 1 %
Eosinophils Absolute: 0.1 10*3/uL (ref 0.0–0.5)
Eosinophils Relative: 1 %
HCT: 38.5 % (ref 36.0–46.0)
Hemoglobin: 12.8 g/dL (ref 12.0–15.0)
Immature Granulocytes: 0 %
Lymphocytes Relative: 17 %
Lymphs Abs: 1.9 10*3/uL (ref 0.7–4.0)
MCH: 30.8 pg (ref 26.0–34.0)
MCHC: 33.2 g/dL (ref 30.0–36.0)
MCV: 92.8 fL (ref 80.0–100.0)
Monocytes Absolute: 0.7 10*3/uL (ref 0.1–1.0)
Monocytes Relative: 6 %
Neutro Abs: 8.4 10*3/uL — ABNORMAL HIGH (ref 1.7–7.7)
Neutrophils Relative %: 75 %
Platelets: 282 10*3/uL (ref 150–400)
RBC: 4.15 MIL/uL (ref 3.87–5.11)
RDW: 12.7 % (ref 11.5–15.5)
WBC: 11.1 10*3/uL — ABNORMAL HIGH (ref 4.0–10.5)
nRBC: 0 % (ref 0.0–0.2)

## 2020-02-01 LAB — URINALYSIS, ROUTINE W REFLEX MICROSCOPIC
Bilirubin Urine: NEGATIVE
Glucose, UA: NEGATIVE mg/dL
Ketones, ur: 80 mg/dL — AB
Nitrite: POSITIVE — AB
Protein, ur: 100 mg/dL — AB
Specific Gravity, Urine: 1.013 (ref 1.005–1.030)
WBC, UA: >50 WBC/hpf — ABNORMAL HIGH (ref 0–5)
pH: 6 (ref 5.0–8.0)

## 2020-02-01 LAB — WET PREP, GENITAL
Clue Cells Wet Prep HPF POC: NONE SEEN
Sperm: NONE SEEN
Trich, Wet Prep: NONE SEEN
Yeast Wet Prep HPF POC: NONE SEEN

## 2020-02-01 LAB — LIPASE, BLOOD: Lipase: 17 U/L (ref 11–51)

## 2020-02-01 MED ORDER — IOHEXOL 300 MG/ML  SOLN: 100.0000 mL | Freq: Once | INTRAMUSCULAR | Status: DC | PRN

## 2020-02-01 MED ORDER — NAPROXEN 500 MG PO TABS: 500.0000 mg | ORAL_TABLET | Freq: Two times a day (BID) | ORAL | 0 refills | Status: DC

## 2020-02-01 MED ORDER — CEPHALEXIN 500 MG PO CAPS: 500.0000 mg | ORAL_CAPSULE | Freq: Four times a day (QID) | ORAL | 0 refills | Status: AC

## 2020-02-01 MED ORDER — SODIUM CHLORIDE 0.9 % IV SOLN
1.0000 g | Freq: Once | INTRAVENOUS | Status: AC
  Administered 2020-02-01: 1 g via INTRAVENOUS
  Filled 2020-02-01: qty 10

## 2020-02-01 MED ORDER — SODIUM CHLORIDE 0.9 % IV BOLUS
1000.0000 mL | Freq: Once | INTRAVENOUS | Status: AC
  Administered 2020-02-01: 1000 mL via INTRAVENOUS

## 2020-02-01 MED ORDER — MORPHINE SULFATE (PF) 4 MG/ML IV SOLN
4.0000 mg | Freq: Once | INTRAVENOUS | Status: AC
  Administered 2020-02-01: 4 mg via INTRAVENOUS
  Filled 2020-02-01: qty 1

## 2020-02-01 MED ORDER — ONDANSETRON 4 MG PO TBDP: 4.0000 mg | ORAL_TABLET | Freq: Three times a day (TID) | ORAL | 0 refills | Status: DC | PRN

## 2020-02-01 MED ORDER — IOHEXOL 300 MG/ML  SOLN
100.0000 mL | Freq: Once | INTRAMUSCULAR | Status: AC | PRN
  Administered 2020-02-01: 100 mL via INTRAVENOUS

## 2020-02-01 MED ORDER — ONDANSETRON HCL 4 MG/2ML IJ SOLN
4.0000 mg | Freq: Once | INTRAMUSCULAR | Status: AC
  Administered 2020-02-01: 4 mg via INTRAVENOUS
  Filled 2020-02-01: qty 2

## 2020-02-01 NOTE — Discharge Instructions (Addendum)
As discussed, your CT scan showed a right kidney infection. You were given antibiotics here in the ER. I am sending you home with antibiotics, nausea medication, and pain medication. Take as prescribed. Call your OBGYN to discuss breastfeeding limitations. Return to the ER for new or worsening symptoms.

## 2020-02-01 NOTE — ED Triage Notes (Signed)
Pt sent from UC for further eval of bilateral flank pain, pelvic pain, cold sweats, urinary frequency since yesterday.

## 2020-02-01 NOTE — ED Provider Notes (Signed)
[facility]ERG[facility] DEPARTMENT Provider Note   CSN: [id number] Arrival date & time: 02/01/20  1230     History Chief Complaint  Patient presents with  . Flank Pain    Linda Duffy is a 34 y.o. female with a past medical history significant for depression, anemia, and sickle cell trait who presents to the ED from urgent care due to bilateral flank pain associated with dysuria.  Patient states she started having back pain last night associated with chills.  Denies fever.  She admits to history of chronic UTIs with her last UTI being roughly 1 year ago. Denies overlying rash to area. Back/flank pain associated with generalized abdominal pain and increased white vaginal discharge. She admits to being sexually active with the same partner for the past 8-9 years with no concern for STDs. She has tried ibuprofen with no relief. No aggravating or alleviating factors.   History obtained from patient and past medical records. No interpreter used during encounter.      Past Medical History:  Diagnosis Date  . Abnormal Pap smear    had cryo after 1st c-section and nl after that  . Anemia   . Depression   . 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 History of gestational hypertension   . Ovarian cyst 2012  . Pyelonephritis   . Sickle cell trait ([place])   . Vaginal Pap smear, abnormal     Patient Active Problem List   Diagnosis Date Noted  . Postoperative anemia 11/12/2019  . Encounter for maternal care for low transverse scar from repeat cesarean delivery 11/10/2019  . History of group B Streptococcus (GBS) infection 11/01/2019  . IUGR (intrauterine growth restriction) affecting care of mother 10/28/2019  . No transfusions per religious beliefs 09/29/2019  . Hx of cesarean section 06/30/2019  . Supervision of high risk pregnancy, antepartum 06/16/2019  . Advanced maternal age in multigravida 06/16/2019  . History of gestational hypertension   . Short interval between  pregnancies affecting pregnancy, antepartum   . Depression   . Sickle cell trait in mother affecting pregnancy ([place]) 06/25/2016  . Anemia affecting pregnancy in second trimester 06/09/2016  . Late prenatal care affecting pregnancy in second trimester 06/02/2016    Past Surgical History:  Procedure Laterality Date  . CERVIX LESION DESTRUCTION     after 1st c-section and nl paps after that  . CESAREAN SECTION     x1  . CESAREAN SECTION N/A 05/24/2015   Procedure: REPEAT CESAREAN SECTION;  Surgeon: Frederico [facility], MD;  Location: [facility];  Service: Obstetrics;  Laterality: N/A;  . CESAREAN SECTION N/A 07/04/2016   Procedure: CESAREAN SECTION;  Surgeon: Lavonia Drafts, MD;  Location: [facility];  Service: Obstetrics;  Laterality: N/A;  . CESAREAN SECTION N/A 02/17/2018   Procedure: REPEAT CESAREAN SECTION;  Surgeon: Gwynne Edinger, MD;  Location: [facility];  Service: Obstetrics;  Laterality: N/A;  . CESAREAN SECTION N/A 11/10/2019   Procedure: CESAREAN SECTION;  Surgeon: Mora [facility], MD;  Location: [facility];  Service: Obstetrics;  Laterality: N/A;  . DILATION AND CURETTAGE OF UTERUS     x2 for EABs     OB History    Gravida  7   Para  5   Term  5   Preterm  0   AB  2   Living  5     SAB  0   TAB  2   Ectopic  0   Multiple  0   Live Births  5           Family History  Problem Relation Age of Onset  . Diabetes Mother   . Hypertension Mother   . Heart disease Mother   . Asthma Mother     Social History   Tobacco Use  . Smoking status: Former Smoker    Packs/day: 0.25    Types: Cigarettes    Quit date: 09/18/2014    Years since quitting: 5.3  . Smokeless tobacco: Never Used  Substance Use Topics  . Alcohol use: No  . Drug use: Not Currently    Types: Marijuana    Comment: not since she found out she was pregnant    Home Medications Prior to Admission medications   Medication Sig Start Date End Date Taking?  Authorizing Provider  medroxyPROGESTERone (DEPO-PROVERA) 150 MG/ML injection Inject 150 mg into the muscle every 3 (three) months.   Yes [provider]  NON FORMULARY Take 1 tablet by mouth See admin instructions. Every WomanT One Daily Multivitamin [place] Vitamins- Take 1 tablet by mouth once a day   Yes [provider]  PROBIOTIC PRODUCT PO Take 1 capsule by mouth daily.    Yes [provider]  aspirin EC 81 MG tablet Take 1 tablet (81 mg total) by mouth daily. Patient not taking: Reported on 02/01/2020 07/04/19   Rasch, Anderson Malta I, NP  Blood Pressure Monitoring (BLOOD PRESSURE KIT) DEVI 1 Device by Does not apply route as needed. 06/16/19   Rasch, Anderson Malta I, NP  cephALEXin (KEFLEX) 500 MG capsule Take 1 capsule (500 mg total) by mouth 4 (four) times daily for 7 days. 02/01/20 02/08/20  Suzy Bouchard, PA-C  gabapentin (NEURONTIN) 100 MG capsule Take 1 capsule (100 mg total) by mouth 3 (three) times daily. Patient not taking: Reported on 02/01/2020 11/12/19   Laury Deep, CNM  ibuprofen (ADVIL) 800 MG tablet Take 1 tablet (800 mg total) by mouth every 8 (eight) hours. Patient not taking: Reported on 02/01/2020 11/12/19   Laury Deep, CNM  naproxen (NAPROSYN) 500 MG tablet Take 1 tablet (500 mg total) by mouth 2 (two) times daily. 02/01/20   Suzy Bouchard, PA-C  ondansetron (ZOFRAN ODT) 4 MG disintegrating tablet Take 1 tablet (4 mg total) by mouth every 8 (eight) hours as needed for nausea or vomiting. 02/01/20   Suzy Bouchard, PA-C  oxyCODONE-acetaminophen (PERCOCET/ROXICET) 5-325 MG tablet Take 1 tablet by mouth every 4 (four) hours as needed for moderate pain. Patient not taking: Reported on 02/01/2020 11/12/19   Laury Deep, CNM  Prenatal Vit-Fe Fumarate-FA (PREPLUS) 27-1 MG TABS Take 1 tablet by mouth daily. Patient not taking: Reported on 02/01/2020 07/11/19   Emily Filbert, MD    Allergies    Patient has no known allergies.  Review of Systems    Review of Systems  Constitutional: Positive for chills. Negative for fever.  Respiratory: Negative for shortness of breath.   Cardiovascular: Negative for chest pain.  Gastrointestinal: Positive for abdominal pain and nausea. Negative for diarrhea and vomiting.  Genitourinary: Positive for dysuria and flank pain. Negative for hematuria.  Musculoskeletal: Positive for back pain.  All other systems reviewed and are negative.   Physical Exam Updated Vital Signs BP 134/90 (BP Location: Left Arm)   Pulse (!) 117   Temp 100 F (37.8 C) (Rectal)   Resp 17   LMP 12/14/2019 (Approximate)   SpO2 99%   Breastfeeding Yes Comment: Neg Hcg  Physical Exam Vitals and nursing note reviewed. Exam conducted with a chaperone present.  Constitutional:      General: She is not in acute distress.    Appearance: She is not toxic-appearing.  HENT:     Head: Normocephalic.  Eyes:     Pupils: Pupils are equal, round, and reactive to light.  Cardiovascular:     Rate and Rhythm: Normal rate and regular rhythm.     Pulses: Normal pulses.     Heart sounds: Normal heart sounds. No murmur. No friction rub. No gallop.   Pulmonary:     Effort: Pulmonary effort is normal.     Breath sounds: Normal breath sounds.  Abdominal:     General: Abdomen is flat. Bowel sounds are normal. There is no distension.     Palpations: Abdomen is soft.     Tenderness: There is abdominal tenderness. There is right CVA tenderness and left CVA tenderness. There is no guarding or rebound.     Comments: Diffuse abdominal tenderness with voluntary guarding. Positive bilateral CVA tenderness.   Genitourinary:    Exam position: Supine.     Vagina: Vaginal discharge present. No bleeding.     Cervix: Discharge present. No cervical motion tenderness.     Adnexa:        Right: Tenderness present. No mass.         Left: Tenderness present. No mass.       Comments: No CMT. Mild brownish discharge.  Musculoskeletal:     Cervical  back: Neck supple.     Comments: Able to move all 4 extremities without difficulty.   Skin:    General: Skin is warm and dry.  Neurological:     General: No focal deficit present.     Mental Status: She is alert.  Psychiatric:        Mood and Affect: Mood normal.     ED Results / Procedures / Treatments   Labs (all labs ordered are listed, but only abnormal results are displayed) Labs Reviewed  WET PREP, GENITAL - Abnormal; Notable for the following components:      Result Value   WBC, Wet Prep HPF POC MODERATE (*)    All other components within normal limits  COMPREHENSIVE METABOLIC PANEL - Abnormal; Notable for the following components:   CO2 20 (*)    Glucose, Bld 102 (*)    Total Protein 8.8 (*)    Total Bilirubin 1.3 (*)    All other components within normal limits  URINALYSIS, ROUTINE W REFLEX MICROSCOPIC - Abnormal; Notable for the following components:   Color, Urine AMBER (*)    APPearance CLOUDY (*)    Hgb urine dipstick MODERATE (*)    Ketones, ur 80 (*)    Protein, ur 100 (*)    Nitrite POSITIVE (*)    Leukocytes,Ua LARGE (*)    WBC, UA >50 (*)    Bacteria, UA MANY (*)    Non Squamous Epithelial 0-5 (*)    All other components within normal limits  CBC WITH DIFFERENTIAL/PLATELET - Abnormal; Notable for the following components:   WBC 11.1 (*)    Neutro Abs 8.4 (*)    All other components within normal limits  URINE CULTURE  LIPASE, BLOOD  I-STAT BETA HCG BLOOD, ED ([facility], [facility], [facility] ONLY)  GC/CHLAMYDIA PROBE AMP ([place]) NOT AT [facility]    EKG None  Radiology CT ABDOMEN PELVIS W CONTRAST  Result Date: 02/01/2020 CLINICAL DATA:  Abdominal pain.  Pelvic pain.  Cold sweats. EXAM: CT ABDOMEN AND PELVIS WITH CONTRAST TECHNIQUE: Multidetector CT imaging of the abdomen and pelvis was performed using the standard protocol following bolus administration of intravenous contrast. CONTRAST:  167m OMNIPAQUE IOHEXOL 300 MG/ML  SOLN COMPARISON:  10/11/2008 FINDINGS: Lower  chest: The lung bases are clear. The heart size is normal. Hepatobiliary: The liver is normal. Normal gallbladder.There is no biliary ductal dilation. Pancreas: Normal contours without ductal dilatation. No peripancreatic fluid collection. Spleen: Unremarkable. Adrenals/Urinary Tract: --Adrenal glands: Unremarkable. --Right kidney/ureter: There is mild right-sided collecting system dilatation without evidence for a radiopaque obstructing kidney stone. There is enhancement thickening of the right ureter. There is a slightly striated appearance of the right kidney, most evident in the upper pole. --Left kidney/ureter: No hydronephrosis or radiopaque kidney stones. --Urinary bladder: Unremarkable. Stomach/Bowel: --Stomach/Duodenum: No hiatal hernia or other gastric abnormality. Normal duodenal course and caliber. --Small bowel: Unremarkable. --Colon: Unremarkable. --Appendix: Normal. Vascular/Lymphatic: Normal course and caliber of the major abdominal vessels. --No retroperitoneal lymphadenopathy. --No mesenteric lymphadenopathy. --No pelvic or inguinal lymphadenopathy. Reproductive: Unremarkable. The patient is likely status post prior Caesarean section. Other: No ascites or free air. The abdominal wall is normal. Musculoskeletal. No acute displaced fractures. IMPRESSION: Overall findings concerning for right-sided pyelonephritis in the appropriate clinical setting. There are no radiopaque obstructing kidney stones. Normal appendix in the right lower quadrant. Electronically Signed   By: CConstance HolsterM.D.   On: 02/01/2020 21:40    Procedures Procedures (including critical care time)  Medications Ordered in ED Medications  sodium chloride 0.9 % bolus 1,000 mL (1,000 mLs Intravenous New Bag/Given 02/01/20 1943)  morphine 4 MG/ML injection 4 mg (4 mg Intravenous Given 02/01/20 1940)  ondansetron (ZOFRAN) injection 4 mg (4 mg Intravenous Given 02/01/20 1939)  cefTRIAXone (ROCEPHIN) 1 g in sodium chloride 0.9 %  100 mL IVPB (0 g Intravenous Stopped 02/01/20 2058)  iohexol (OMNIPAQUE) 300 MG/ML solution 100 mL (100 mLs Intravenous Contrast Given 02/01/20 2127)    ED Course  I have reviewed the triage vital signs and the nursing notes.  Pertinent labs & imaging results that were available during my care of the patient were reviewed by me and considered in my medical decision making (see chart for details).  Clinical Course as of Feb 01 2216  Wed Feb 01, 2020  1941 WBC, Wet Prep HPF POC(!): MODERATE [CA]    Clinical Course User Index [CA] ASuzy Bouchard PA-C   MDM Rules/Calculators/A&P                     34year old female presents the ED from urgent care due to bilateral flank pain associated with dysuria and increased vaginal discharge.  Patient has a history of chronic UTIs with last UTI being roughly 1 year ago.  Upon my initial evaluation which was roughly 5.5 hours after patient arrived, patient tachycardic at 117 and borderline febrile at 100, but otherwise normal vitals. Physical exam significant for bilateral CVA tenderness. Abdomen soft, non-distended with generalized abdominal tenderness. No overlying rash to suggest shingle on the back. Pelvic exam significant for mild discharge and bilateral adnexal tenderness, but no masses. No CMT. Doubt PID.  CMP, pregnancy test, and UA ordered at triage.  Will add on CBC, wet prep, urine culture, gonorrhea/chlamydia, and lipase.  Suspect symptoms related to pyelonephritis given CVA tenderness.  Will obtain CT scan to rule out abscess formation.  IV fluids, Zofran, morphine given to patient.  UA significant for positive nitrites  and large leukocytes and moderate hematuria.  Give IV Rocephin.  CMP reassuring with normal renal function and no major electrolyte derangements.  Pregnancy test negative.  CBC significant for mild leukocytosis at 11.1.  Lipase normal at 17 doubt pancreatitis.  Wet prep significant for moderate white blood cells, but otherwise  unremarkable.  Gonorrhea/chlamydia cultures pending. CT abdomen personally reviewed which demonstrates:   IMPRESSION:  Overall findings concerning for right-sided pyelonephritis in the  appropriate clinical setting. There are no radiopaque obstructing  kidney stones.    Normal appendix in the right lower quadrant.   10:06 PM reassessed patient at bedside to go over results. Shared decision making in regards to admission vs outpatient therapy. Patient wishes to go home. Patient able to tolerate po here in the ED. Will discharge patient with antibiotics, zofran, and naproxen. Advised patient to follow-up with PCP on Friday for recheck. Advised patient to call OBGYN tomorrow to discuss antibiotics and breastfeeding. Strict ED precautions discussed with patient. Patient states understanding and agrees to plan. Patient discharged home in no acute distress and stable vitals. Final Clinical Impression(s) / ED Diagnoses Final diagnoses:  Pyelonephritis    Rx / DC Orders ED Discharge Orders         Ordered    cephALEXin (KEFLEX) 500 MG capsule  4 times daily     02/01/20 2210    ondansetron (ZOFRAN ODT) 4 MG disintegrating tablet  Every 8 hours PRN     02/01/20 2210    naproxen (NAPROSYN) 500 MG tablet  2 times daily     02/01/20 2210           Suzy Bouchard, PA-C 02/01/20 2217    Deno Etienne, DO 02/01/20 2218

## 2020-02-02 LAB — GC/CHLAMYDIA PROBE AMP (~~LOC~~) NOT AT ARMC
Chlamydia: NEGATIVE
Comment: NEGATIVE
Comment: NORMAL
Neisseria Gonorrhea: NEGATIVE

## 2020-02-03 LAB — URINE CULTURE: Culture: >=100000 — AB

## 2020-02-05 ENCOUNTER — Telehealth: Payer: Self-pay | Admitting: Emergency Medicine

## 2020-02-05 NOTE — Telephone Encounter (Signed)
Post ED Visit - Positive Culture Follow-up  Culture report reviewed by antimicrobial stewardship pharmacist: [facility] []  , Pharm.D. []  Enzo Bi, Pharm.D., BCPS AQ-ID []  , Pharm.D., BCPS []  Celedonio Miyamoto, Pharm.D., BCPS []  Penelope, Garvin Fila.D., BCPS, AAHIVP []  , Pharm.D., BCPS, AAHIVP [x]  Georgina Pillion, PharmD, BCPS []  , PharmD, BCPS []  [place], PharmD, BCPS []  [address], PharmD []  , PharmD, BCPS []  Estella Husk, PharmD  Pharmacy Team []  Lysle Pearl, PharmD []  , PharmD []  Phillips Climes, PharmD []  , Rph []  Agapito Games) , PharmD []  Verlan Friends, PharmD []  , PharmD []  Mervyn Gay, PharmD []  , PharmD []  Vinnie Level, PharmD []  [facility], PharmD []  , PharmD []  Len Childs, PharmD   Positive urine culture Treated with Cephalexin, organism sensitive to the same and no further patient follow-up is required at this time.  Francesa Eugenio 02/05/2020, 10:39 AM

## 2020-02-14 DIAGNOSIS — Z5181 Encounter for therapeutic drug level monitoring: Secondary | ICD-10-CM | POA: Diagnosis not present

## 2020-02-15 DIAGNOSIS — Z5181 Encounter for therapeutic drug level monitoring: Secondary | ICD-10-CM | POA: Diagnosis not present

## 2020-03-13 ENCOUNTER — Other Ambulatory Visit: Payer: Self-pay

## 2020-03-13 ENCOUNTER — Ambulatory Visit (INDEPENDENT_AMBULATORY_CARE_PROVIDER_SITE_OTHER): Payer: Medicaid Other | Admitting: General Practice

## 2020-03-13 VITALS — BP 119/83 | HR 90 | Ht 66.0 in | Wt 171.0 lb

## 2020-03-13 DIAGNOSIS — Z30013 Encounter for initial prescription of injectable contraceptive: Secondary | ICD-10-CM | POA: Diagnosis not present

## 2020-03-13 NOTE — Progress Notes (Signed)
Garnette Czech here for Depo-Provera  Injection.  Injection administered without complication. Patient will return in 3 months for next injection.  Marylynn Pearson, RN 03/13/2020  10:33 AM

## 2020-03-14 NOTE — Progress Notes (Signed)
Patient was assessed and managed by nursing staff during this encounter. I have reviewed the chart and agree with the documentation and plan. I have also made any necessary editorial changes.  Catalina Antigua, MD 03/14/2020 10:22 AM

## 2020-03-21 DIAGNOSIS — Z5181 Encounter for therapeutic drug level monitoring: Secondary | ICD-10-CM | POA: Diagnosis not present

## 2020-03-22 DIAGNOSIS — Z5181 Encounter for therapeutic drug level monitoring: Secondary | ICD-10-CM | POA: Diagnosis not present

## 2020-04-04 IMAGING — US US MFM FETAL BPP W/O NON-STRESS
1 series · 12 of 28 positions shown · non-contrast
Comparison: none

[Series 1: us mfm fetal bpp w/o non-stress · 29 acquisitions, 12 frames shown]
[im 2/29]
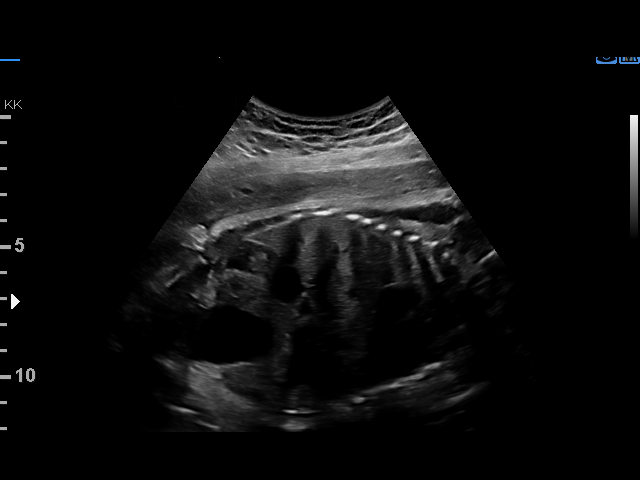
[im 4/29]
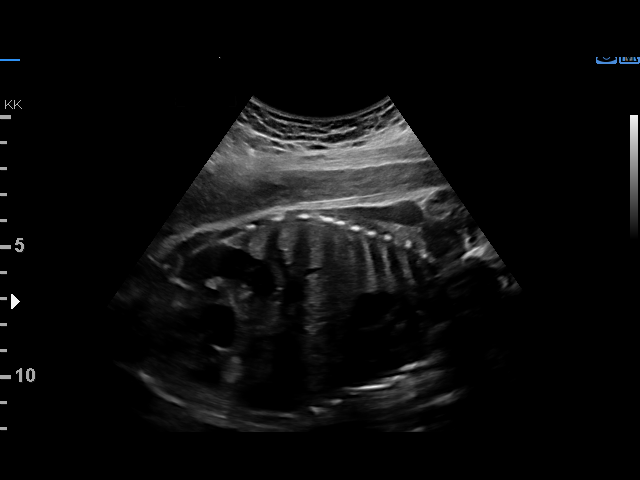
[im 6/29]
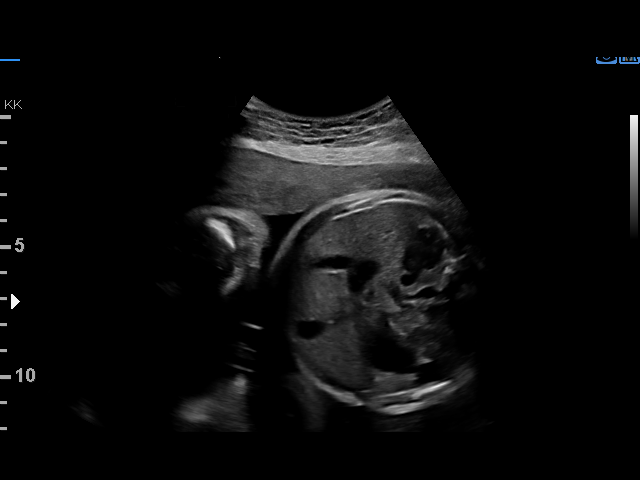
[im 9/29]
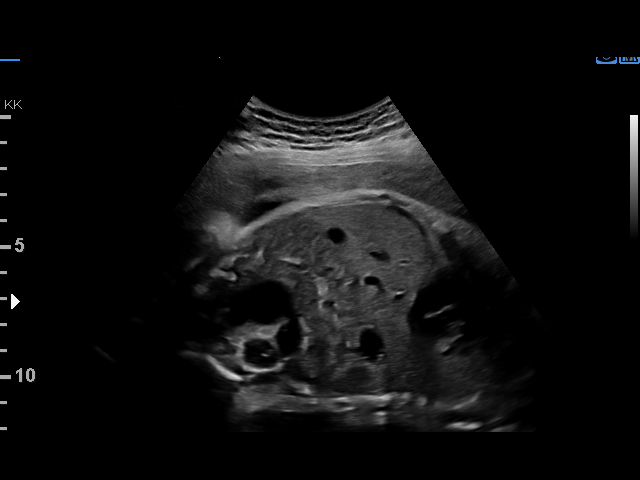
[im 11/29]
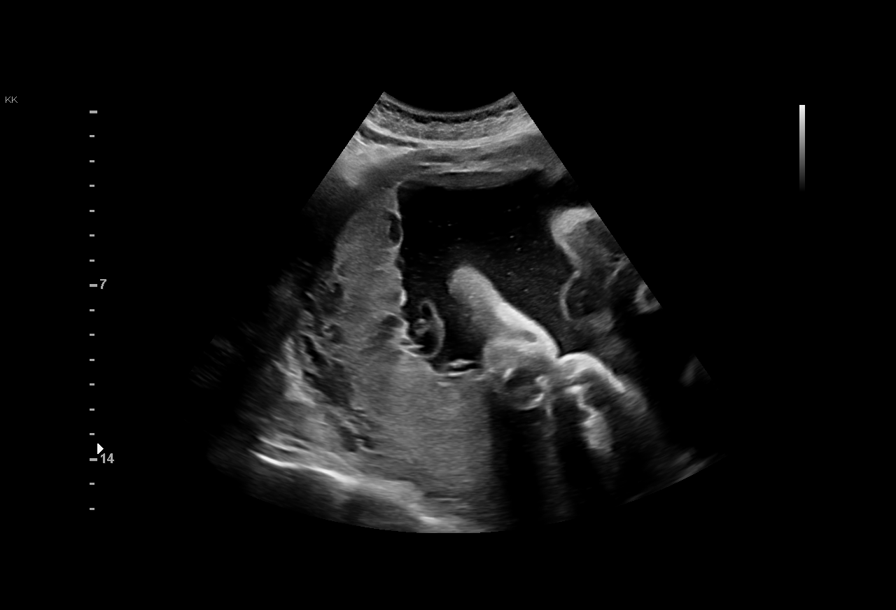
[im 13/29]
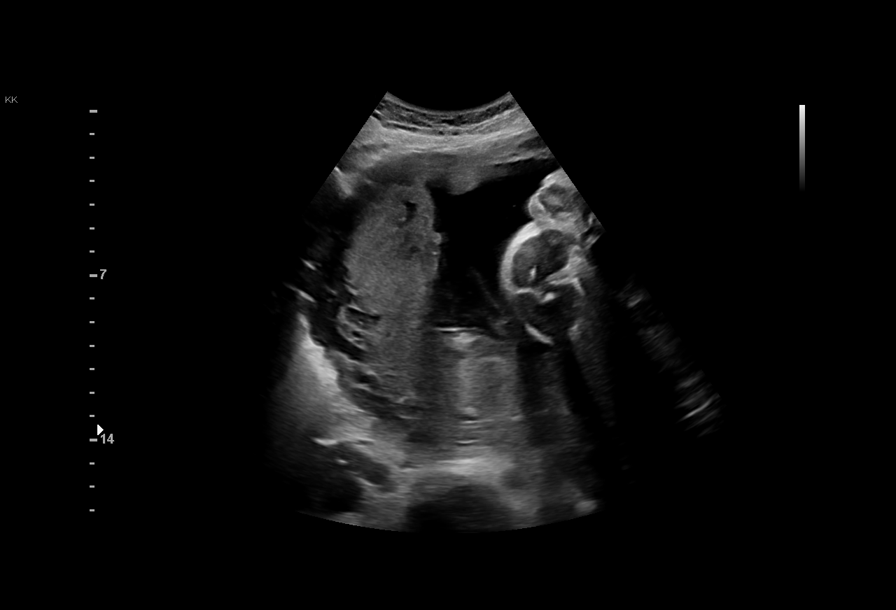
[im 16/29]
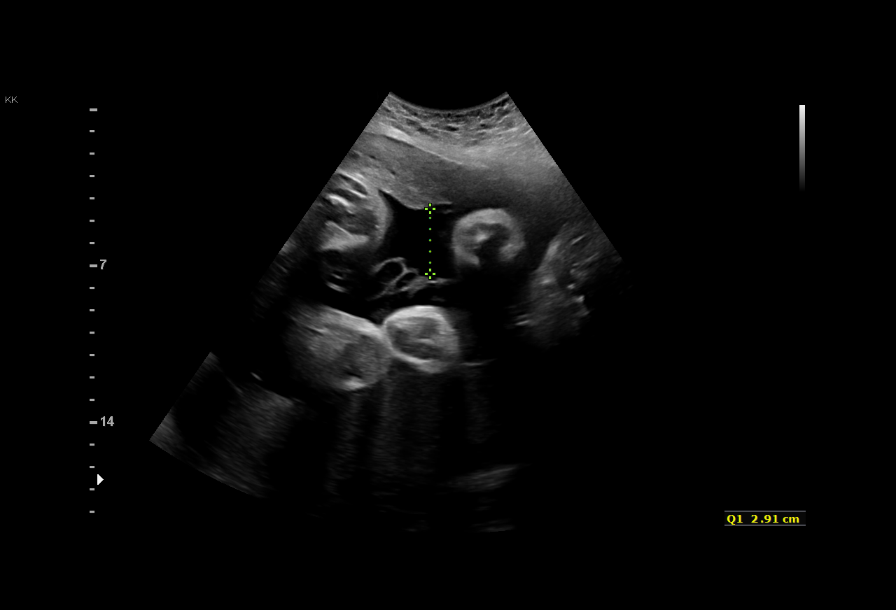
[im 18/29]
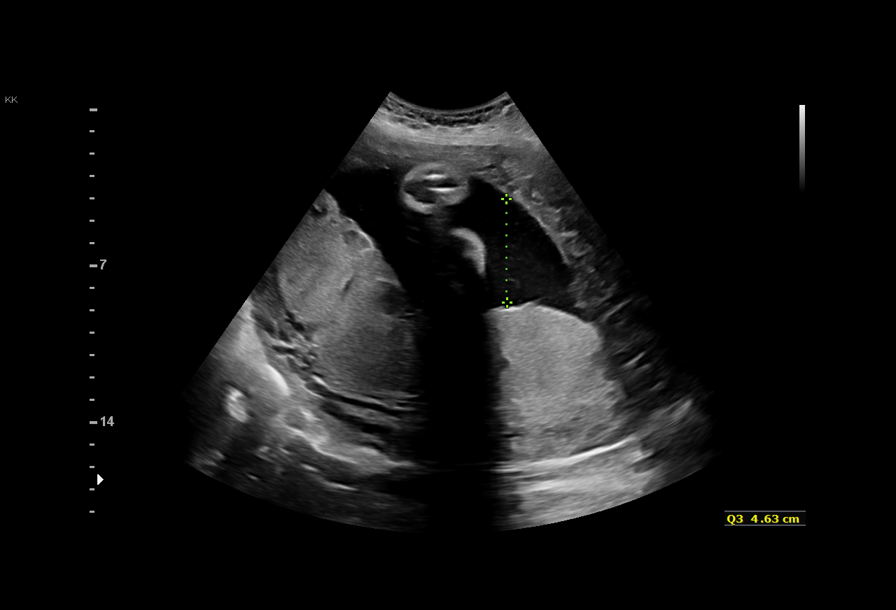
[im 20/29]
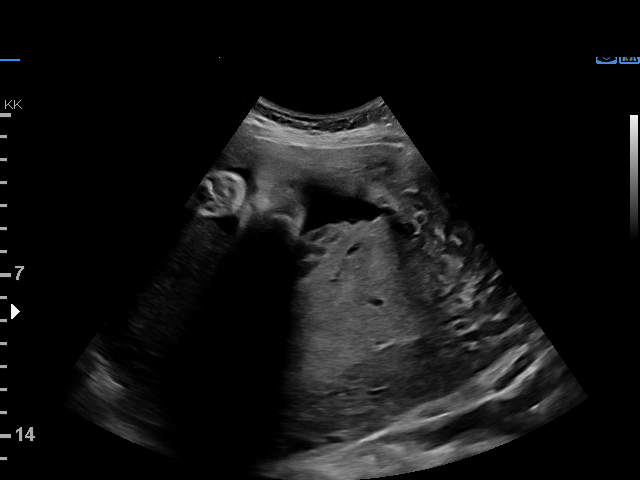
[im 23/29]
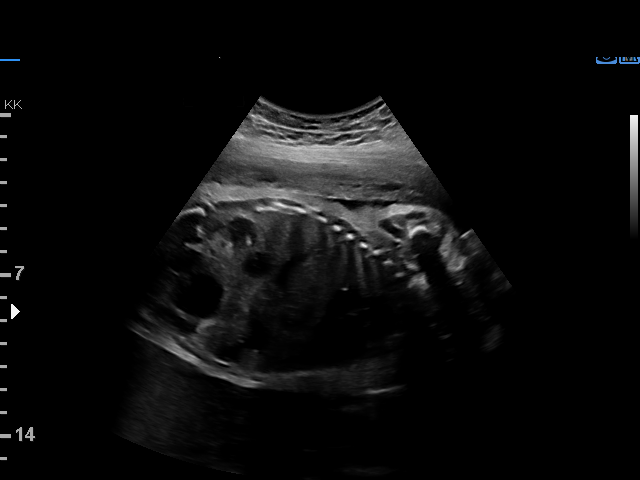
[im 25/29]
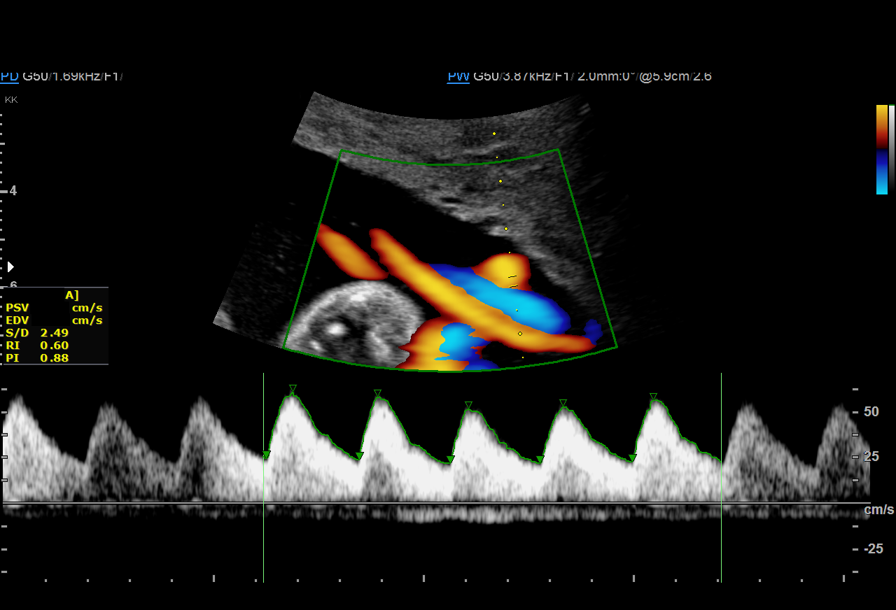
[im 27/29]
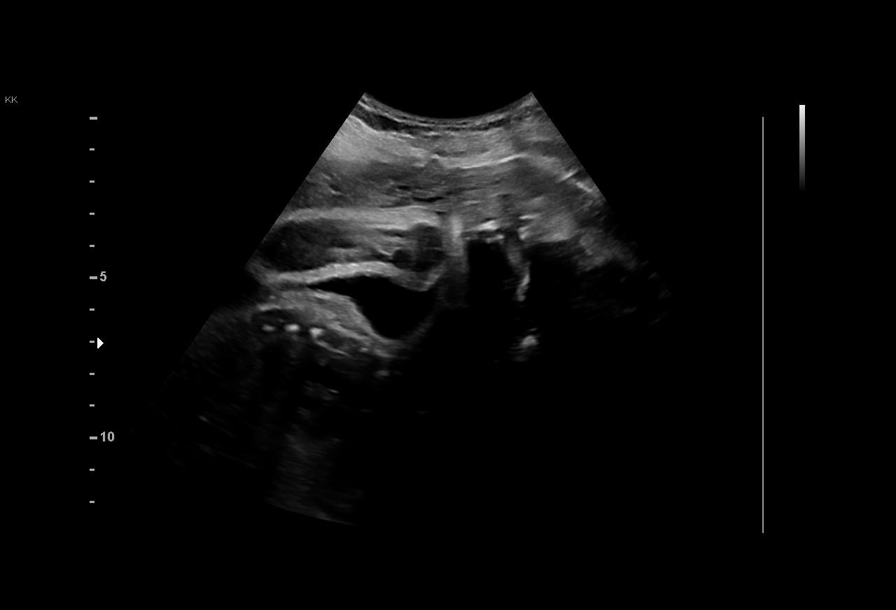

[12 of 28 positions shown; findings below may reference images not displayed]

[address]

 ----------------------------------------------------------------------

 ----------------------------------------------------------------------
Indications

  Maternal care for known or suspected poor
  fetal growth, third trimester, fetus 1 IUGR
  Advanced maternal age multigravida 35+,
  third trimester
  Previous cesarean delivery, antepartum (X4)
  History of sickle cell trait
  Low Risk NIPS(Negative AFP)
  33 weeks gestation of pregnancy
 ----------------------------------------------------------------------
Vital Signs

                                                Height:        5'7"
Fetal Evaluation

 Num Of Fetuses:         1
 Fetal Heart Rate(bpm):  136
 Cardiac Activity:       Observed
 Presentation:           Cephalic
 Placenta:               Posterior
 P. Cord Insertion:      Previously Visualized

 Amniotic Fluid
 AFI FV:      Within normal limits

 AFI Sum(cm)     %Tile       Largest Pocket(cm)
 17.05           62

 RUQ(cm)       RLQ(cm)       LUQ(cm)        LLQ(cm)

Biophysical Evaluation

 Amniotic F.V:   Pocket => 2 cm             F. Tone:        Observed
 F. Movement:    Observed                   Score:          [DATE]
 F. Breathing:   Observed
OB History

 Gravidity:    7         Term:   4
Gestational Age

 LMP:           38w 1d        Date:  01/13/19                 EDD:   10/20/19
 Best:          33w 1d     Det. By:  U/S  (06/29/19)          EDD:   11/24/19
Doppler - Fetal Vessels

 Umbilical Artery
  S/D     %tile     RI              PI                     ADFV    RDFV
 2.55       47   0.61             0.91                        No      No

Comments

 This patient was seen due to an IUGR fetus.  She denies any
 problems since her last exam.
 A biophysical profile performed today was [DATE].
 There was normal amniotic fluid noted on today's ultrasound
 exam.
 Doppler studies of the umbilical arteries performed due to
 fetal growth restriction showed a normal S/D ratio of 2.55.
 There were no signs of absent or reversed end-diastolic flow.
 Another biophysical profile was scheduled in 1 week.

## 2020-04-11 IMAGING — US US MFM FETAL BPP W/O NON-STRESS
1 series · 15 of 25 positions shown · non-contrast
Comparison: none

[Series 1: us mfm fetal bpp w/o non-stress · 25 acquisitions, 15 frames shown]
[im 1/25]
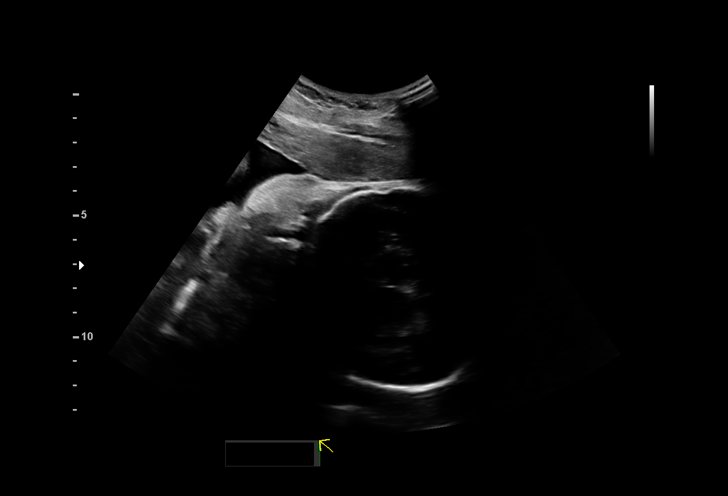
[im 3/25]
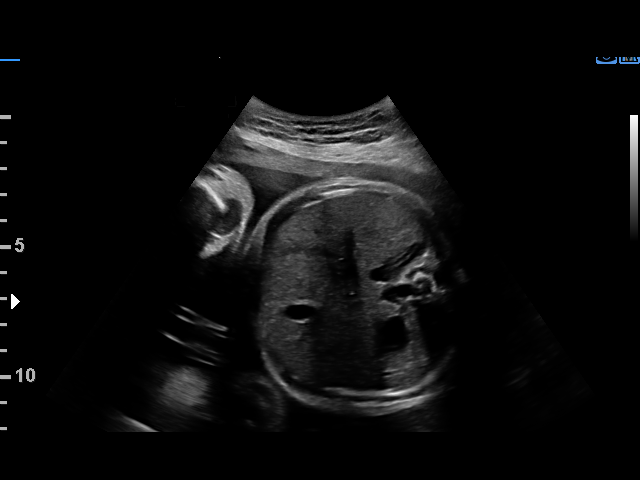
[im 5/25]
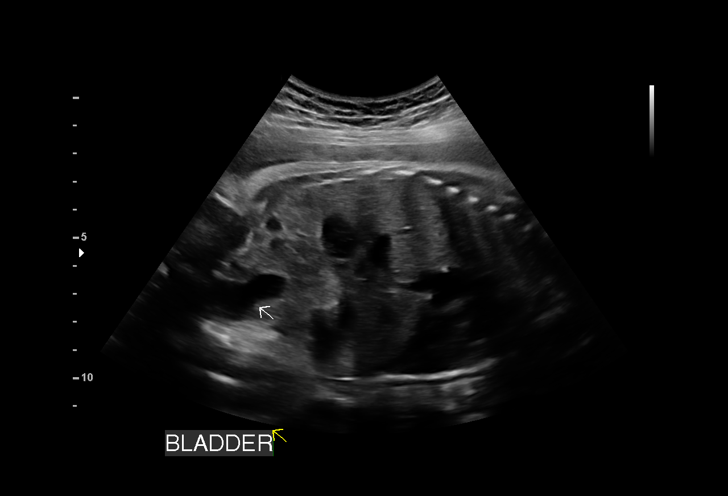
[im 6/25]
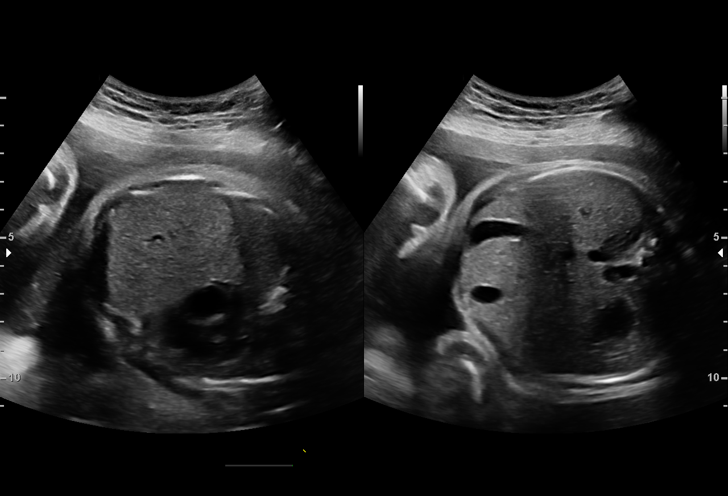
[im 8/25]
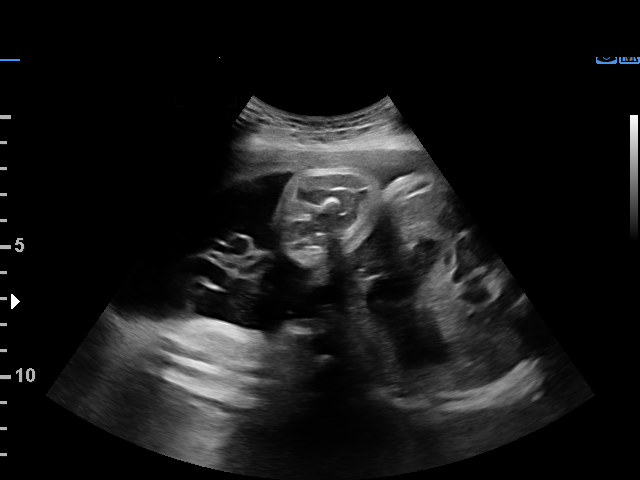
[im 10/25]
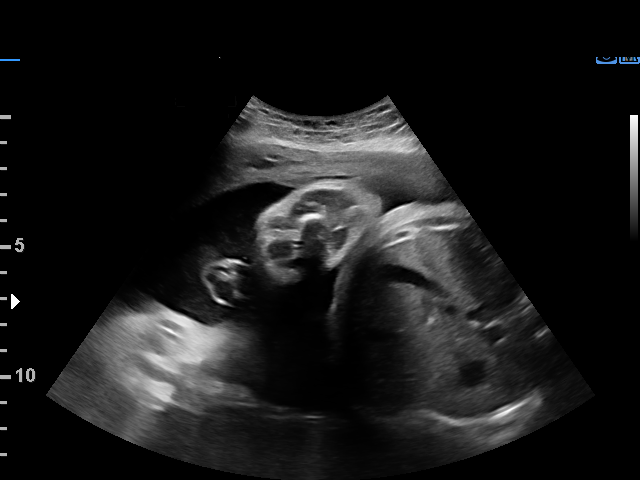
[im 11/25]
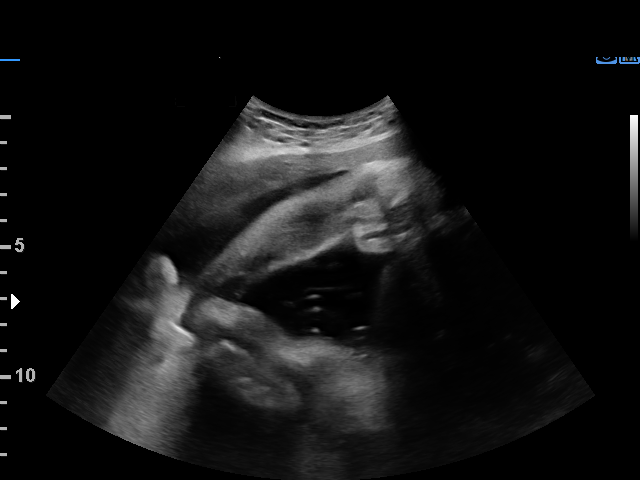
[im 13/25]
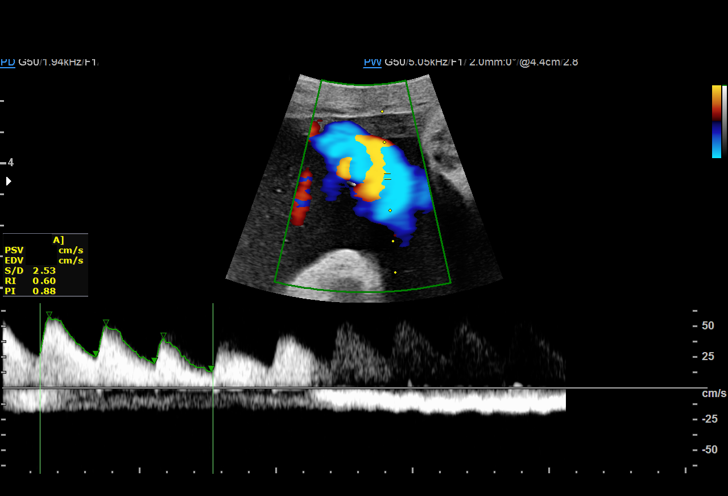
[im 15/25]
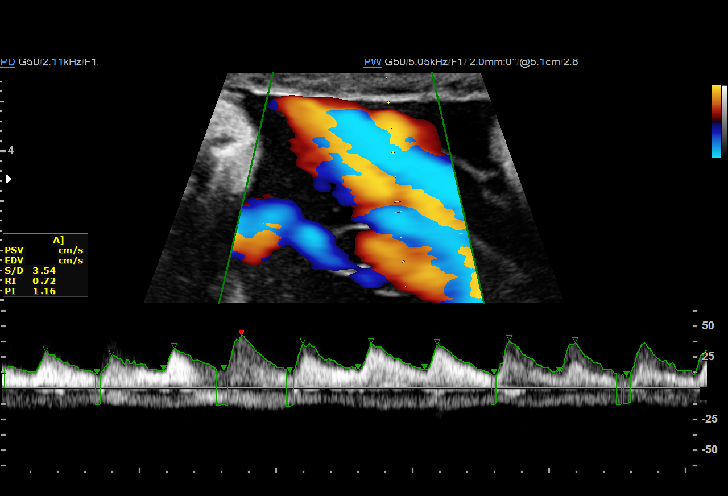
[im 16/25]
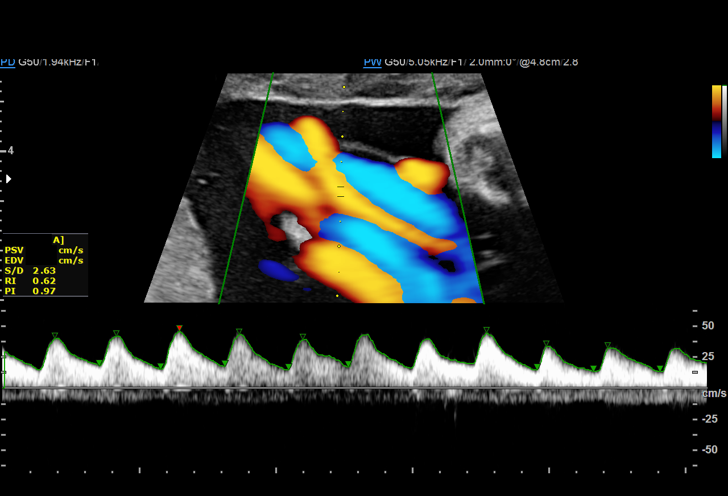
[im 18/25]
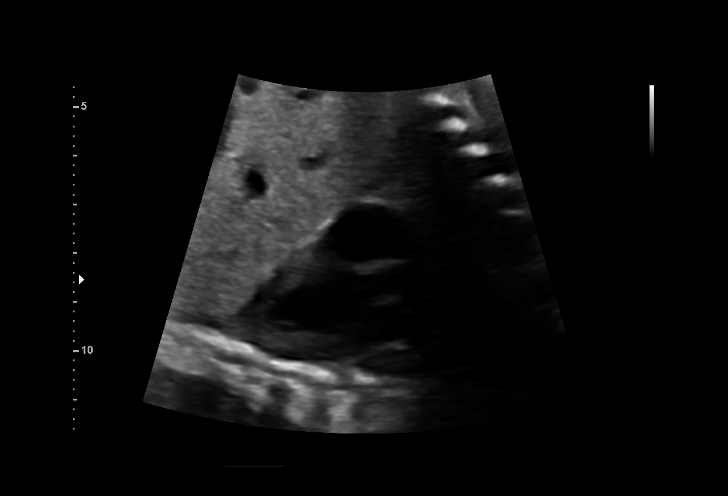
[im 20/25]
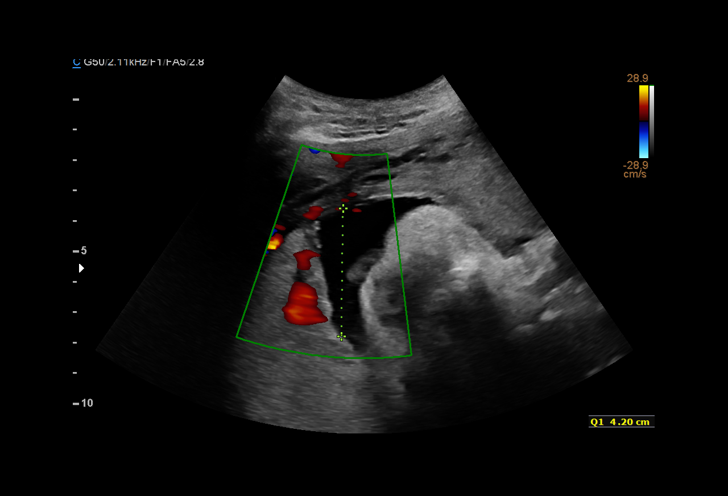
[im 21/25]
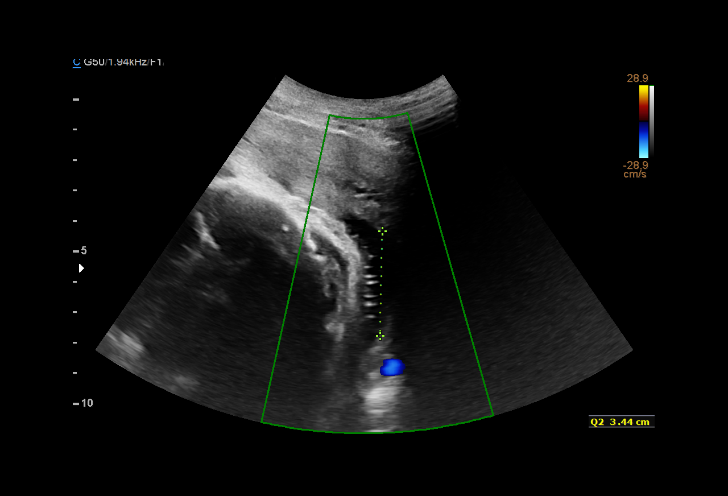
[im 23/25]
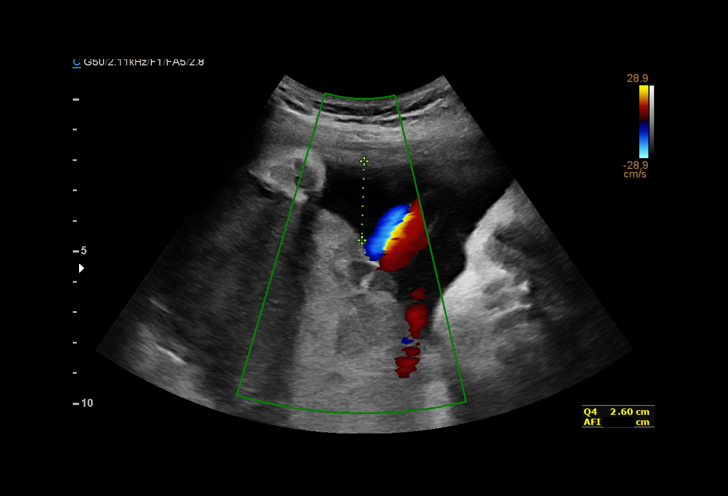
[im 25/25]
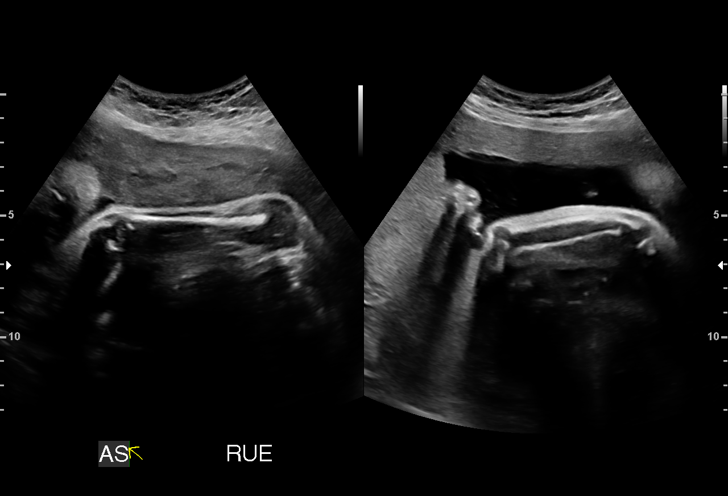

[15 of 25 positions shown; findings below may reference images not displayed]

[address]

 ----------------------------------------------------------------------

 ----------------------------------------------------------------------
Indications

  34 weeks gestation of pregnancy
  Maternal care for known or suspected poor
  fetal growth, third trimester, fetus 1 IUGR
  Advanced maternal age multigravida 35+,
  third trimester
  Previous cesarean delivery, antepartum (X4)
  History of sickle cell trait
  Low Risk NIPS(Negative AFP)
 ----------------------------------------------------------------------
Vital Signs

                                                Height:        5'7"
Fetal Evaluation

 Num Of Fetuses:         1
 Fetal Heart Rate(bpm):  135
 Cardiac Activity:       Observed
 Presentation:           Cephalic

 Amniotic Fluid
 AFI FV:      Within normal limits

 AFI Sum(cm)     %Tile       Largest Pocket(cm)
 14.91           53

 RUQ(cm)       RLQ(cm)       LUQ(cm)        LLQ(cm)

Biophysical Evaluation
 Amniotic F.V:   Within normal limits       F. Tone:        Observed
 F. Movement:    Observed                   Score:          [DATE]
 F. Breathing:   Observed
OB History

 Gravidity:    7         Term:   4
Gestational Age

 LMP:           39w 1d        Date:  01/13/19                 EDD:   10/20/19
 Best:          34w 1d     Det. By:  U/S  (06/29/19)          EDD:   11/24/19
Anatomy

 Thoracic:              Appears normal         Bladder:                Appears normal
 Stomach:               Appears normal, left
                        sided
Doppler - Fetal Vessels

 Umbilical Artery
  S/D     %tile     RI              PI                     ADFV    RDFV
 3.08       80   0.68             1.07                         N       N

Cervix Uterus Adnexa

 Cervix
 Not visualized (advanced GA >53wks)
Impression

 Patient return for BPP and umbilical artery (UA) Doppler
 study.  On ultrasound performed 2 weeks ago, the estimated
 fetal weight was at the 9th percentile.  Patient does not have
 chronic medical conditions including hypertension.  Her blood
 pressure today at our office is 110/61 mmHg.

 On ultrasound, amniotic fluid is normal and good fetal activity
 seen.  Antenatal testing is reassuring.  BPP [DATE].  Umbilical
 artery Doppler showed normal forward diastolic flow.

 We reassured the patient of the findings.  We will discuss the
 timing of delivery when she returns for fetal growth
 assessment next week.
Recommendations

 -Continue weekly BPP and Doppler till delivery.
 -To discuss timing of delivery after fetal growth assessment
 next week.
                 [place], Prince Adeleke

## 2020-05-02 DIAGNOSIS — R309 Painful micturition, unspecified: Secondary | ICD-10-CM | POA: Diagnosis not present

## 2020-05-02 DIAGNOSIS — N12 Tubulo-interstitial nephritis, not specified as acute or chronic: Secondary | ICD-10-CM | POA: Diagnosis not present

## 2020-05-05 IMAGING — US US MFM FETAL BPP W/O NON-STRESS
1 series · 14 of 28 positions shown · non-contrast
Comparison: none

[Series 1: us mfm fetal bpp w/o non-stress · 37 acquisitions, 14 frames shown]
[im 2/37]
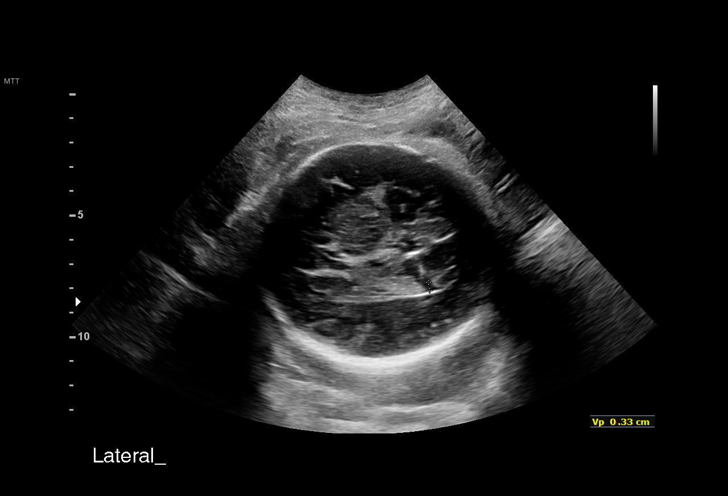
[im 5/37]
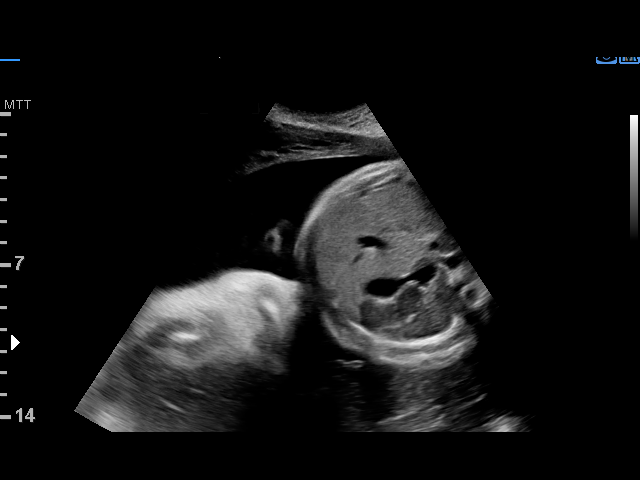
[im 7/37]
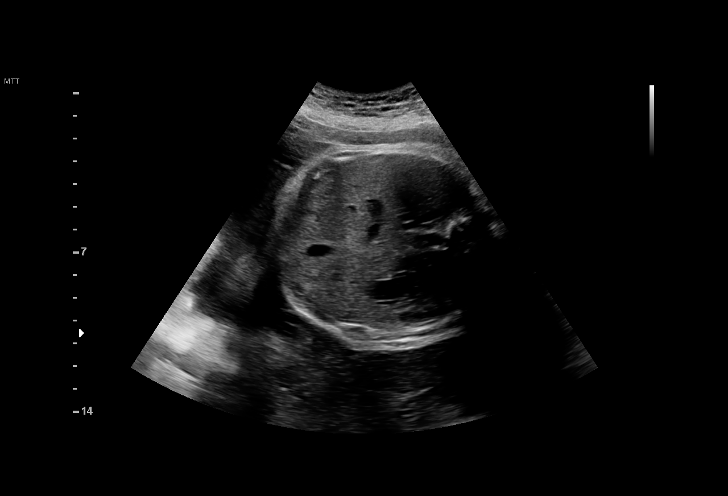
[im 10/37]
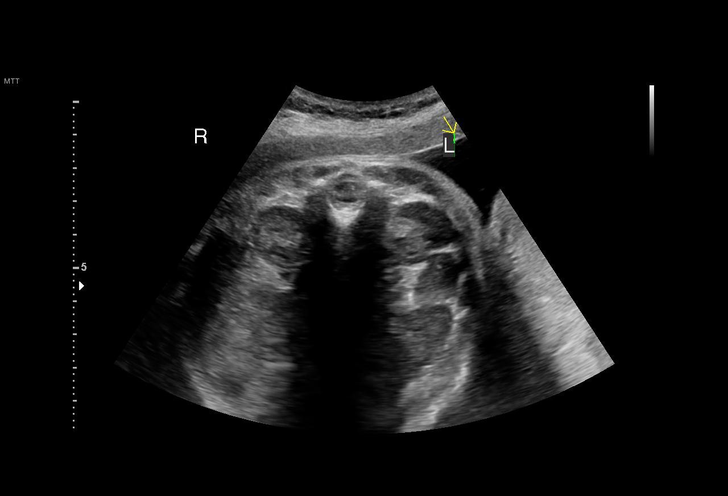
[im 13/37]
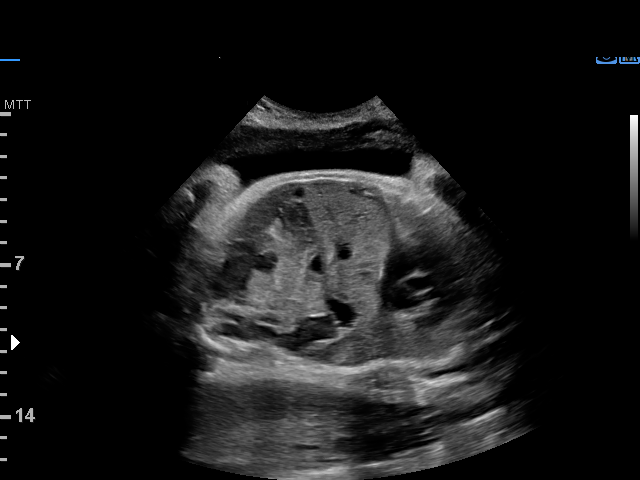
[im 15/37]
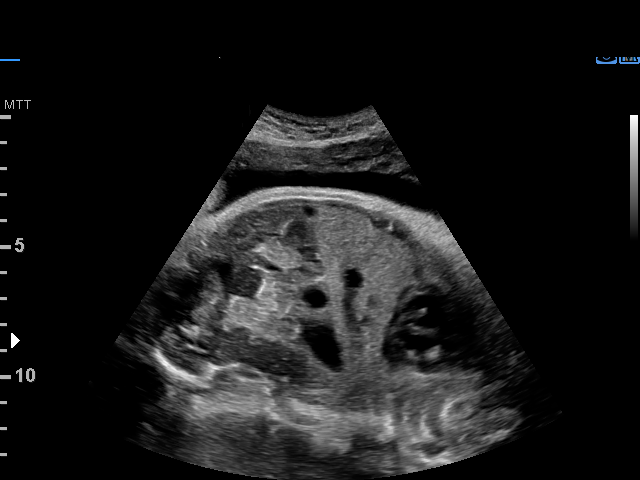
[im 18/37]
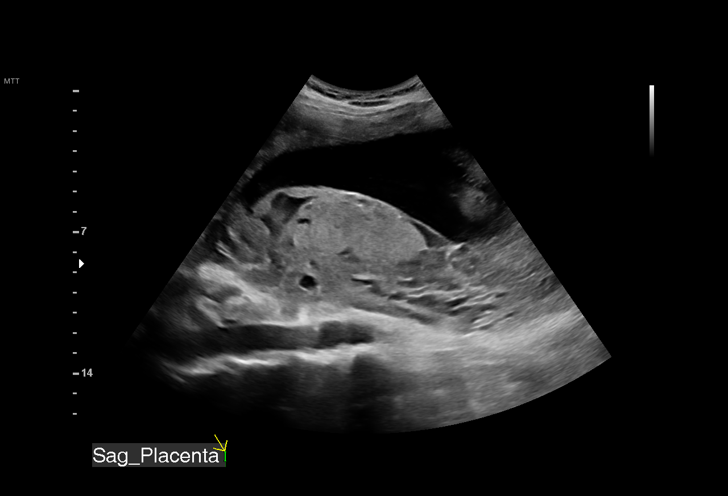
[im 21/37]
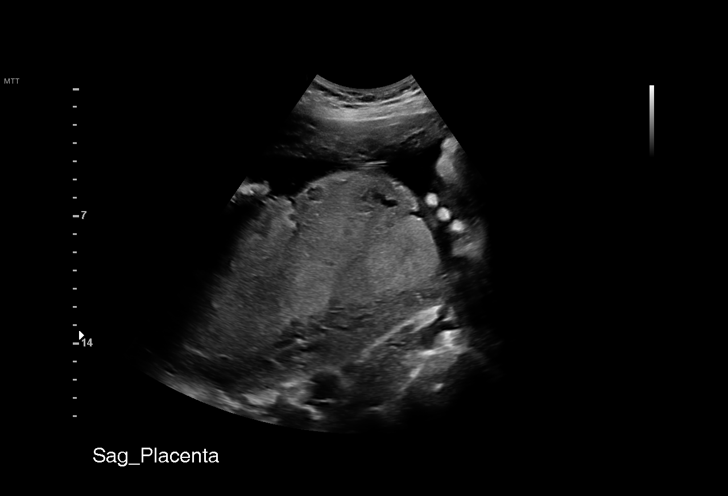
[im 23/37]
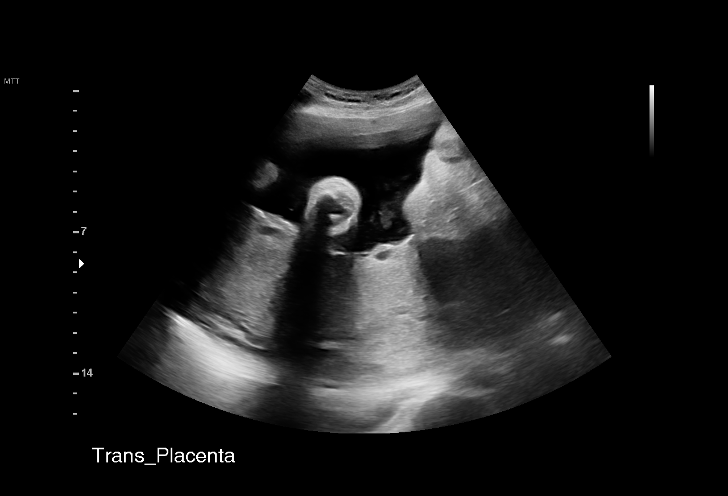
[im 26/37]
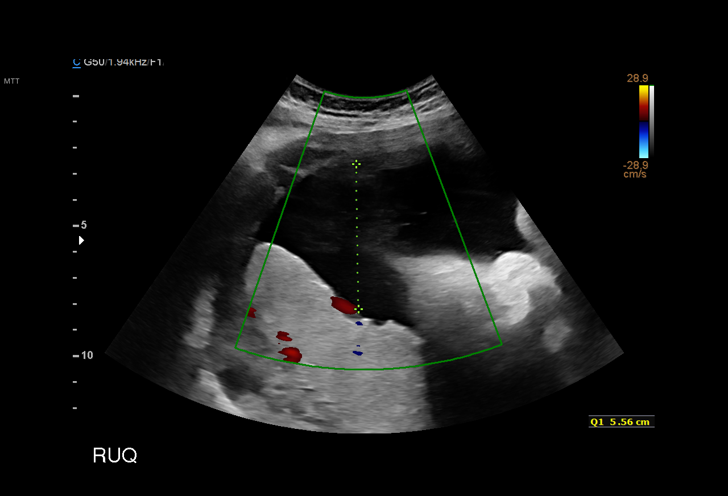
[im 29/37]
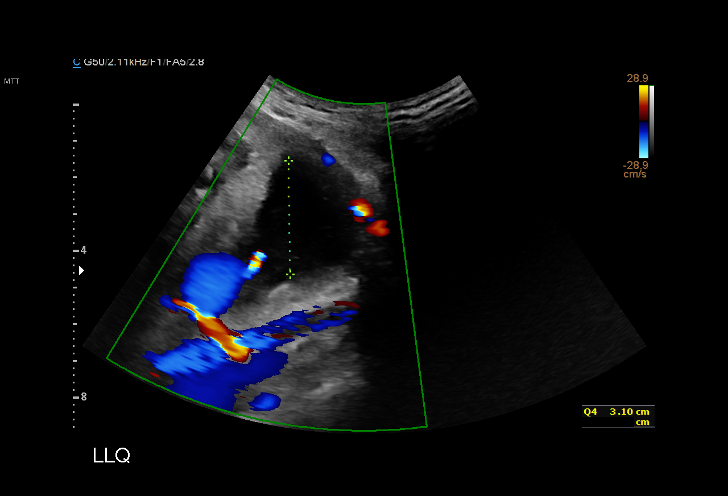
[im 31/37]
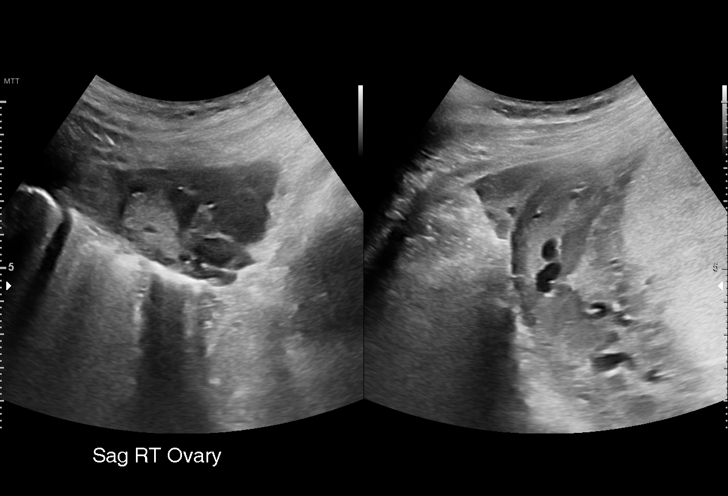
[im 34/37]
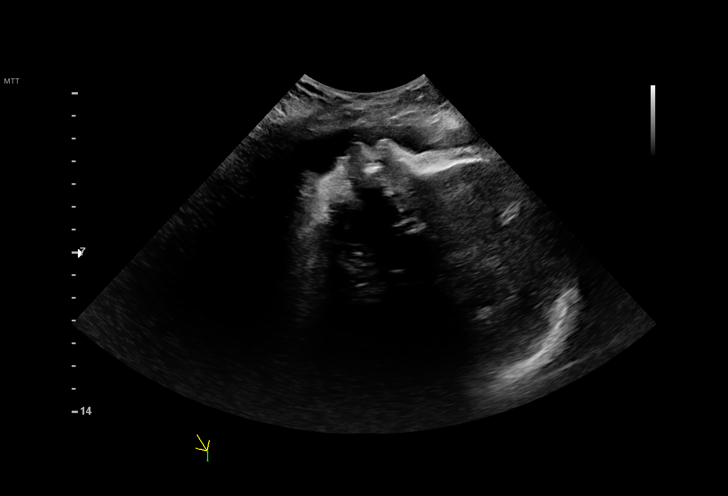
[im 37/37]
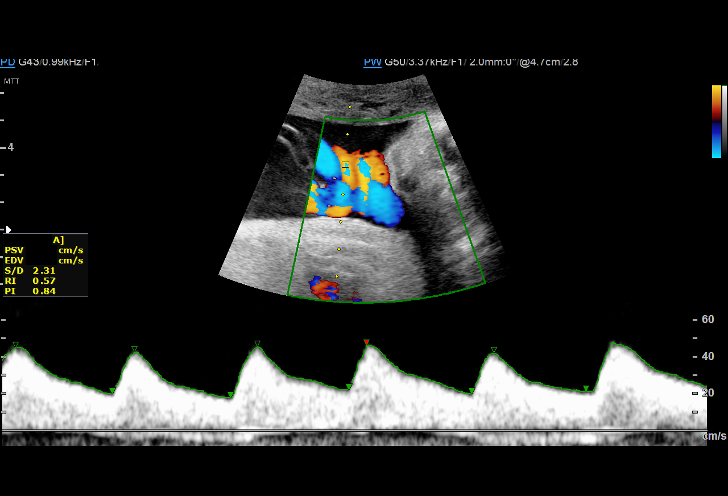

[14 of 28 positions shown; findings below may reference images not displayed]

[address]

 ----------------------------------------------------------------------

 ----------------------------------------------------------------------
Indications

  Maternal care for known or suspected poor
  fetal growth, third trimester, not applicable or
  unspecified IUGR
  37 weeks gestation of pregnancy
  Advanced maternal age multigravida 35+,
  third trimester
  Previous cesarean delivery, antepartum (X4)
  History of sickle cell trait
  Low Risk NIPS(Negative AFP)
 ----------------------------------------------------------------------
Vital Signs

                                                Height:        5'7"
Fetal Evaluation

 Num Of Fetuses:         1
 Fetal Heart Rate(bpm):  136
 Cardiac Activity:       Observed
 Presentation:           Cephalic
 Placenta:               Posterior
 P. Cord Insertion:      Previously Visualized

 Amniotic Fluid
 AFI FV:      Within normal limits

 AFI Sum(cm)     %Tile       Largest Pocket(cm)
 20.05           78

 RUQ(cm)       RLQ(cm)       LUQ(cm)        LLQ(cm)

Biophysical Evaluation

 Amniotic F.V:   Pocket => 2 cm             F. Tone:        Observed
 F. Movement:    Observed                   Score:          [DATE]
 F. Breathing:   Observed
Biometry

 LV:        3.3  mm
OB History

 Gravidity:    7         Term:   4
Gestational Age

 LMP:           42w 4d        Date:  01/13/19                 EDD:   10/20/19
 Best:          37w 4d     Det. By:  U/S  (06/29/19)          EDD:   11/24/19
Doppler - Fetal Vessels

 Umbilical Artery
  S/D     %tile     RI              PI                     ADFV    RDFV
 2.15       38   0.53             0.74                        No      No

Cervix Uterus Adnexa

 Cervix
 Not visualized (advanced GA >48wks)

 Uterus
 No abnormality visualized.

 Left Ovary
 Within normal limits. No adnexal mass visualized.

 Right Ovary
 Within normal limits. No adnexal mass visualized.

 Cul De Sac
 No free fluid seen.

 Adnexa
 No abnormality visualized.
Comments

 This patient was seen due to an IUGR fetus.  She denies any
 problems since her last exam.
 A biophysical profile performed today was [DATE].
 There was normal amniotic fluid noted on today's ultrasound
 exam.
 Doppler studies of the umbilical arteries performed due to
 fetal growth restriction showed a normal S/D ratio of 2.15.
 There were no signs of absent or reversed end-diastolic flow.
 The patient is already scheduled for delivery via repeat C-
 section in 3 days.

## 2020-05-29 ENCOUNTER — Other Ambulatory Visit: Payer: Self-pay

## 2020-05-29 ENCOUNTER — Ambulatory Visit (INDEPENDENT_AMBULATORY_CARE_PROVIDER_SITE_OTHER): Payer: Medicaid Other | Admitting: Lactation Services

## 2020-05-29 ENCOUNTER — Encounter: Payer: Self-pay | Admitting: Lactation Services

## 2020-05-29 VITALS — BP 127/85 | HR 85 | Ht 67.0 in | Wt 170.1 lb

## 2020-05-29 DIAGNOSIS — Z3042 Encounter for surveillance of injectable contraceptive: Secondary | ICD-10-CM | POA: Diagnosis not present

## 2020-05-29 MED ORDER — MEDROXYPROGESTERONE ACETATE 150 MG/ML IM SUSP
150.0000 mg | Freq: Once | INTRAMUSCULAR | Status: AC
  Administered 2020-05-29: 150 mg via INTRAMUSCULAR

## 2020-05-29 NOTE — Progress Notes (Signed)
Linda Duffy here for Depo-Provera  Injection.  Injection administered without complication. Patient will return in 3 months for next injection. Patient tolerated well. Patient has no questions or concerns at this time.   Patient indicates her car is broken down right now and she took a Lyft to get here. Reviewed with patient that [place] does have transportation to appointments in [facility] and to let the office know if she needs ride to appointments in the office. Patient voiced understanding.   Ed Blalock, RN 05/29/2020  10:33 AM     2

## 2020-05-29 NOTE — Progress Notes (Signed)
Patient was assessed and managed by nursing staff during this encounter. I have reviewed the chart and agree with the documentation and plan. I have also made any necessary editorial changes.  Thressa Sheller DNP, CNM  05/29/20  12:03 PM

## 2020-08-14 ENCOUNTER — Ambulatory Visit: Payer: Medicaid Other

## 2020-08-20 ENCOUNTER — Encounter: Payer: Self-pay | Admitting: Lactation Services

## 2020-08-20 ENCOUNTER — Ambulatory Visit (INDEPENDENT_AMBULATORY_CARE_PROVIDER_SITE_OTHER): Payer: Medicaid Other | Admitting: Lactation Services

## 2020-08-20 ENCOUNTER — Other Ambulatory Visit: Payer: Self-pay

## 2020-08-20 VITALS — BP 93/75 | HR 102 | Ht 67.0 in | Wt 165.3 lb

## 2020-08-20 DIAGNOSIS — Z3042 Encounter for surveillance of injectable contraceptive: Secondary | ICD-10-CM

## 2020-08-20 MED ORDER — MEDROXYPROGESTERONE ACETATE 150 MG/ML IM SUSP
150.0000 mg | Freq: Once | INTRAMUSCULAR | Status: AC
  Administered 2020-08-20: 150 mg via INTRAMUSCULAR

## 2020-08-20 NOTE — Progress Notes (Signed)
Linda Duffy here for Depo-Provera Injection. Injection administered without complication. Patient will return in 3 months for next injection between March 14 and November 19, 2020. Next annual visit due 12/15/2019.   Ed Blalock, RN 08/20/2020  10:35 AM

## 2020-11-11 ENCOUNTER — Other Ambulatory Visit: Payer: Self-pay

## 2022-11-11 ENCOUNTER — Inpatient Hospital Stay (HOSPITAL_COMMUNITY)
Admission: AD | Admit: 2022-11-11 | Discharge: 2022-11-11 | Disposition: A | Discharge status: Home / Self Care | Payer: Medicaid Other | Attending: Family Medicine | Admitting: Family Medicine

## 2022-11-11 ENCOUNTER — Other Ambulatory Visit: Payer: Self-pay

## 2022-11-11 ENCOUNTER — Encounter (HOSPITAL_COMMUNITY): Payer: Self-pay

## 2022-11-11 ENCOUNTER — Inpatient Hospital Stay (HOSPITAL_COMMUNITY): Payer: Medicaid Other

## 2022-11-11 ENCOUNTER — Emergency Department (HOSPITAL_COMMUNITY): Payer: Medicaid Other

## 2022-11-11 DIAGNOSIS — O209 Hemorrhage in early pregnancy, unspecified: Secondary | ICD-10-CM | POA: Diagnosis not present

## 2022-11-11 DIAGNOSIS — O34219 Maternal care for unspecified type scar from previous cesarean delivery: Secondary | ICD-10-CM | POA: Diagnosis not present

## 2022-11-11 DIAGNOSIS — R1030 Lower abdominal pain, unspecified: Secondary | ICD-10-CM

## 2022-11-11 DIAGNOSIS — Z3A00 Weeks of gestation of pregnancy not specified: Secondary | ICD-10-CM | POA: Diagnosis not present

## 2022-11-11 DIAGNOSIS — O034 Incomplete spontaneous abortion without complication: Secondary | ICD-10-CM

## 2022-11-11 DIAGNOSIS — Z3A12 12 weeks gestation of pregnancy: Secondary | ICD-10-CM

## 2022-11-11 DIAGNOSIS — O26891 Other specified pregnancy related conditions, first trimester: Secondary | ICD-10-CM | POA: Diagnosis not present

## 2022-11-11 DIAGNOSIS — N939 Abnormal uterine and vaginal bleeding, unspecified: Secondary | ICD-10-CM | POA: Diagnosis not present

## 2022-11-11 DIAGNOSIS — R9431 Abnormal electrocardiogram [ECG] [EKG]: Secondary | ICD-10-CM | POA: Diagnosis not present

## 2022-11-11 LAB — CBC WITH DIFFERENTIAL/PLATELET
Abs Immature Granulocytes: 0.04 10*3/uL (ref 0.00–0.07)
Basophils Absolute: 0.1 10*3/uL (ref 0.0–0.1)
Basophils Relative: 1 %
Eosinophils Absolute: 0 10*3/uL (ref 0.0–0.5)
Eosinophils Relative: 0 %
HCT: 30.3 % — ABNORMAL LOW (ref 36.0–46.0)
Hemoglobin: 10.4 g/dL — ABNORMAL LOW (ref 12.0–15.0)
Immature Granulocytes: 0 %
Lymphocytes Relative: 15 %
Lymphs Abs: 1.9 10*3/uL (ref 0.7–4.0)
MCH: 30.2 pg (ref 26.0–34.0)
MCHC: 34.3 g/dL (ref 30.0–36.0)
MCV: 88.1 fL (ref 80.0–100.0)
Monocytes Absolute: 0.5 10*3/uL (ref 0.1–1.0)
Monocytes Relative: 4 %
Neutro Abs: 10.4 10*3/uL — ABNORMAL HIGH (ref 1.7–7.7)
Neutrophils Relative %: 80 %
Platelets: 303 10*3/uL (ref 150–400)
RBC: 3.44 MIL/uL — ABNORMAL LOW (ref 3.87–5.11)
RDW: 16.5 % — ABNORMAL HIGH (ref 11.5–15.5)
WBC: 12.9 10*3/uL — ABNORMAL HIGH (ref 4.0–10.5)
nRBC: 0 % (ref 0.0–0.2)

## 2022-11-11 LAB — BASIC METABOLIC PANEL
Anion gap: 11 (ref 5–15)
BUN: 8 mg/dL (ref 6–20)
CO2: 18 mmol/L — ABNORMAL LOW (ref 22–32)
Calcium: 8.9 mg/dL (ref 8.9–10.3)
Chloride: 109 mmol/L (ref 98–111)
Creatinine, Ser: 0.8 mg/dL (ref 0.44–1.00)
GFR, Estimated: >60 mL/min (ref >60)
Glucose, Bld: 130 mg/dL — ABNORMAL HIGH (ref 70–99)
Potassium: 3.8 mmol/L (ref 3.5–5.1)
Sodium: 138 mmol/L (ref 135–145)

## 2022-11-11 LAB — ABO/RH
ABO/RH(D): O POS
Antibody Screen: NEGATIVE

## 2022-11-11 LAB — HCG, QUANTITATIVE, PREGNANCY: hCG, Beta Chain, Quant, S: 1763 m[IU]/mL — ABNORMAL HIGH (ref <5)

## 2022-11-11 LAB — NO BLOOD PRODUCTS

## 2022-11-11 LAB — I-STAT BETA HCG BLOOD, ED (NOT ORDERABLE): I-stat hCG, quantitative: 1631.6 m[IU]/mL — ABNORMAL HIGH (ref <5)

## 2022-11-11 MED ORDER — ACETAMINOPHEN 500 MG PO TABS: 1000.0000 mg | ORAL_TABLET | Freq: Once | ORAL | Status: DC

## 2022-11-11 MED ORDER — KETOROLAC TROMETHAMINE 60 MG/2ML IM SOLN
60.0000 mg | Freq: Once | INTRAMUSCULAR | Status: AC
  Administered 2022-11-11: 60 mg via INTRAMUSCULAR
  Filled 2022-11-11: qty 2

## 2022-11-11 MED ORDER — ONDANSETRON 4 MG PO TBDP: 4.0000 mg | ORAL_TABLET | Freq: Three times a day (TID) | ORAL | 0 refills | Status: DC | PRN

## 2022-11-11 MED ORDER — MISOPROSTOL 200 MCG PO TABS
800.0000 ug | ORAL_TABLET | Freq: Once | ORAL | Status: AC
  Administered 2022-11-11: 800 ug via ORAL
  Filled 2022-11-11: qty 4

## 2022-11-11 MED ORDER — HYDROMORPHONE HCL-NACL 50-0.9 MG/50ML-% IV SOLN: 1.0000 mg/h | Freq: Once | INTRAVENOUS | Status: DC

## 2022-11-11 MED ORDER — SODIUM CHLORIDE 0.9 % IV BOLUS: 1000.0000 mL | Freq: Once | INTRAVENOUS | Status: DC

## 2022-11-11 MED ORDER — LOPERAMIDE HCL 2 MG PO TABS: 2.0000 mg | ORAL_TABLET | Freq: Four times a day (QID) | ORAL | 0 refills | Status: DC | PRN

## 2022-11-11 MED ORDER — ONDANSETRON 4 MG PO TBDP
4.0000 mg | ORAL_TABLET | Freq: Once | ORAL | Status: AC
  Administered 2022-11-11: 4 mg via ORAL
  Filled 2022-11-11: qty 1

## 2022-11-11 MED ORDER — HYDROMORPHONE HCL 1 MG/ML IJ SOLN
1.0000 mg | Freq: Once | INTRAMUSCULAR | Status: AC
  Administered 2022-11-11: 1 mg via INTRAVENOUS
  Filled 2022-11-11: qty 1

## 2022-11-11 MED ORDER — ONDANSETRON HCL 4 MG PO TABS: 4.0000 mg | ORAL_TABLET | Freq: Three times a day (TID) | ORAL | 0 refills | Status: DC | PRN

## 2022-11-11 MED ORDER — OXYCODONE-ACETAMINOPHEN 5-325 MG PO TABS: 1.0000 | ORAL_TABLET | Freq: Four times a day (QID) | ORAL | 0 refills | Status: DC | PRN

## 2022-11-11 MED ORDER — MISOPROSTOL 200 MCG PO TABS: 800.0000 ug | ORAL_TABLET | Freq: Once | ORAL | 0 refills | Status: DC

## 2022-11-11 NOTE — Discharge Instructions (Signed)

## 2022-11-11 NOTE — ED Triage Notes (Signed)
Pt c/o vaginal bleeding with big blood clots started at 0430 today. Pt states she's soaking more than one pad an hour. Pt c/o weakness and dizziness. Pt's LMP 08/2022. Pt states she get irregular menstruals.

## 2022-11-11 NOTE — MAU Provider Note (Signed)
History     CSN: [id number]  Arrival date and time: 11/11/22 1527   Event Date/Time   First Provider Initiated Contact with Patient 11/11/22 1653      Chief Complaint  Patient presents with   Vaginal Bleeding   Linda Duffy , a  37 y.o. [id number] at [redacted]w[redacted]d presents to MAU with complaints of excessive vaginal bleeding and passing clots. Patient reports this started this morning at 0430 am and has continuously gotten worse. She reports passing 3 large clots and saturating a pad every hour. She states her abdominal pain is constant and worsens in waves. "Almost like contractions." And states she feels the need to bear down. She states she has irregular periods and last one was 08/2022. States it started on Dec 26 - Jan 2024. She rates her pain 50/10. She denies attempting to take anything to relieve pain. History of 5 previous c/s.          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   History of gestational hypertension    Ovarian cyst 2012   Pyelonephritis    Sickle cell trait ([place])    Vaginal Pap smear, abnormal     Past Surgical History:  Procedure Laterality Date   CERVIX LESION DESTRUCTION     after 1st c-section and nl paps after that   CESAREAN SECTION     x1   CESAREAN SECTION N/A 05/24/2015   Procedure: REPEAT CESAREAN SECTION;  Surgeon: Frederico Hamman, MD;  Location: [facility];  Service: Obstetrics;  Laterality: N/A;   CESAREAN SECTION N/A 07/04/2016   Procedure: CESAREAN SECTION;  Surgeon: Lavonia Drafts, MD;  Location: [place];  Service: Obstetrics;  Laterality: N/A;   CESAREAN SECTION N/A 02/17/2018   Procedure: REPEAT CESAREAN  SECTION;  Surgeon: Gwynne Edinger, MD;  Location: [place];  Service: Obstetrics;  Laterality: N/A;   CESAREAN SECTION N/A 11/10/2019   Procedure: CESAREAN SECTION;  Surgeon: Mora Bellman, MD;  Location: [place] [place];  Service: Obstetrics;  Laterality: N/A;   DILATION AND CURETTAGE OF UTERUS     x2 for EABs    Family History  Problem Relation Age of Onset   Diabetes Mother    Hypertension Mother    Heart disease Mother    Asthma Mother     Social History   Tobacco Use   Smoking status: Former    Packs/day: .25    Types: Cigarettes    Quit date: 09/18/2014    Years since quitting: 8.1   Smokeless tobacco: Never  Vaping Use   Vaping Use: Never used  Substance Use Topics   Alcohol use: No   Drug use: Not Currently    Types: Marijuana    Comment: not since she found out she was pregnant    Allergies: No Known Allergies  Medications Prior to Admission  Medication Sig Dispense Refill Last  Dose   aspirin EC 81 MG tablet Take 1 tablet (81 mg total) by mouth daily. (Patient not taking: No sig reported) 90 tablet 1    Blood Pressure Monitoring (BLOOD PRESSURE KIT) DEVI 1 Device by Does not apply route as needed. (Patient not taking: No sig reported) 1 Device 0    gabapentin (NEURONTIN) 100 MG capsule Take 1 capsule (100 mg total) by mouth 3 (three) times daily. (Patient not taking: No sig reported) 30 capsule 0    ibuprofen (ADVIL) 800 MG tablet Take 1 tablet (800 mg total) by mouth every 8 (eight) hours. (Patient not taking: No sig reported) 90 tablet 0    medroxyPROGESTERone (DEPO-PROVERA) 150 MG/ML injection Inject 150 mg into the muscle every 3 (three) months.      naproxen (NAPROSYN) 500 MG tablet Take 1 tablet (500 mg total) by mouth 2 (two) times daily. (Patient not taking: No sig reported) 30 tablet 0    NON FORMULARY Take 1 tablet by mouth See admin instructions. Every WomanT One Daily Multivitamin [place] Vitamins- Take 1 tablet by mouth once a day (Patient  not taking: No sig reported)      ondansetron (ZOFRAN ODT) 4 MG disintegrating tablet Take 1 tablet (4 mg total) by mouth every 8 (eight) hours as needed for nausea or vomiting. (Patient not taking: No sig reported) 20 tablet 0    oxyCODONE-acetaminophen (PERCOCET/ROXICET) 5-325 MG tablet Take 1 tablet by mouth every 4 (four) hours as needed for moderate pain. (Patient not taking: No sig reported) 30 tablet 0    Prenatal Vit-Fe Fumarate-FA (PREPLUS) 27-1 MG TABS Take 1 tablet by mouth daily. (Patient not taking: No sig reported) 30 tablet 6    PROBIOTIC PRODUCT PO Take 1 capsule by mouth daily.        Review of Systems  Gastrointestinal:  Positive for abdominal pain.  Genitourinary:  Positive for vaginal bleeding.   Physical Exam   Blood pressure (!) 93/53, pulse 72, temperature 98.4 F (36.9 C), temperature source Oral, resp. rate (!) 22, height 5\' 7"  (1.702 m), weight 75 kg, last menstrual period 08/19/2022, SpO2 100 %, currently breastfeeding.  Physical Exam Vitals and nursing note reviewed. Exam conducted with a chaperone present.  Constitutional:      General: She is in acute distress.     Appearance: Normal appearance. She is ill-appearing and diaphoretic.  HENT:     Head: Normocephalic.  Cardiovascular:     Rate and Rhythm: Tachycardia present.  Pulmonary:     Effort: Pulmonary effort is normal.  Abdominal:     Tenderness: There is abdominal tenderness. There is guarding.  Genitourinary:    Vagina: Bleeding present.     Cervix: Dilated.  Musculoskeletal:     Cervical back: Normal range of motion.  Skin:    General: Skin is warm.  Neurological:     Mental Status: She is alert and oriented to person, place, and time.  Psychiatric:        Mood and Affect: Mood normal.    Patient transferred from ED>   MAU Course  Procedures Orders Placed This Encounter  Procedures   US OB LESS THAN 14 WEEKS WITH OB TRANSVAGINAL   CBC with Differential   Basic metabolic panel    hCG, quantitative, pregnancy   Diet NPO time specified   I-Stat beta hCG blood, ED   I-Stat beta hCG blood, ED   ED EKG   ABO/Rh   No blood products   Insert  peripheral IV   Meds ordered this encounter  Medications   ondansetron (ZOFRAN-ODT) disintegrating tablet 4 mg   sodium chloride 0.9 % bolus 1,000 mL   acetaminophen (TYLENOL) tablet 1,000 mg   DISCONTD: HYDROmorphone (DILAUDID) 50 mg in 50 mL NS (1mg /mL) premix infusion   HYDROmorphone (DILAUDID) injection 1 mg   Results for orders placed or performed during the hospital encounter of 11/11/22 (from the past 24 hour(s))  CBC with Differential     Status: Abnormal   Collection Time: 11/11/22  3:48 PM  Result Value Ref Range   WBC 12.9 (H) 4.0 - 10.5 K/uL   RBC 3.44 (L) 3.87 - 5.11 MIL/uL   Hemoglobin 10.4 (L) 12.0 - 15.0 g/dL   HCT 30.3 (L) 36.0 - 46.0 %   MCV 88.1 80.0 - 100.0 fL   MCH 30.2 26.0 - 34.0 pg   MCHC 34.3 30.0 - 36.0 g/dL   RDW 16.5 (H) 11.5 - 15.5 %   Platelets 303 150 - 400 K/uL   nRBC 0.0 0.0 - 0.2 %   Neutrophils Relative % 80 %   Neutro Abs 10.4 (H) 1.7 - 7.7 K/uL   Lymphocytes Relative 15 %   Lymphs Abs 1.9 0.7 - 4.0 K/uL   Monocytes Relative 4 %   Monocytes Absolute 0.5 0.1 - 1.0 K/uL   Eosinophils Relative 0 %   Eosinophils Absolute 0.0 0.0 - 0.5 K/uL   Basophils Relative 1 %   Basophils Absolute 0.1 0.0 - 0.1 K/uL   Immature Granulocytes 0 %   Abs Immature Granulocytes 0.04 0.00 - 0.07 K/uL  Basic metabolic panel     Status: Abnormal   Collection Time: 11/11/22  3:48 PM  Result Value Ref Range   Sodium 138 135 - 145 mmol/L   Potassium 3.8 3.5 - 5.1 mmol/L   Chloride 109 98 - 111 mmol/L   CO2 18 (L) 22 - 32 mmol/L   Glucose, Bld 130 (H) 70 - 99 mg/dL   BUN 8 6 - 20 mg/dL   Creatinine, Ser 0.80 0.44 - 1.00 mg/dL   Calcium 8.9 8.9 - 10.3 mg/dL   GFR, Estimated >60 >60 mL/min   Anion gap 11 5 - 15  ABO/Rh     Status: None   Collection Time: 11/11/22  3:48 PM  Result Value Ref Range    ABO/RH(D) O POS    Antibody Screen      NEG Performed at [facility], [address] [place], [place] [address]   No blood products     Status: None   Collection Time: 11/11/22  4:05 PM  Result Value Ref Range   Transfuse no blood products      PT REFUSED BLOOD PRODUCTS, CONSULT PATHOLOGIST BEFORE TRANSFUSING  A BANK RN 1605 11/11/22 Performed at [facility] [address], [place], [place] [address]   I-Stat beta hCG blood, ED     Status: Abnormal   Collection Time: 11/11/22  4:18 PM  Result Value Ref Range   I-stat hCG, quantitative 1,631.6 (H) <5 mIU/mL   Comment 3           US OB LESS THAN 14 WEEKS WITH OB TRANSVAGINAL  Result Date: 11/11/2022 CLINICAL DATA:  Pregnant with heavy vaginal bleeding and abdominal pain. EXAM: OBSTETRIC <14 WK [place] AND TRANSVAGINAL OB US TECHNIQUE: Both transabdominal and transvaginal ultrasound examinations were performed for complete evaluation of the gestation as well as the maternal uterus, adnexal regions, and  pelvic cul-de-sac. Transvaginal technique was performed to assess early pregnancy. COMPARISON:  None Available. FINDINGS: Intrauterine gestational sac: Irregular elongated gestational sac appears to be in the lower uterine segment/cervical canal. No yolk sac or embryonic pole. Findings consistent with on going spontaneous abortion. Subchorionic hemorrhage:  None visualized. Maternal uterus/adnexae: The ovaries are not visualized. No free pelvic fluid collections. IMPRESSION: Sonographic findings consistent with on going/impending spontaneous abortion. Electronically Signed   By: Marijo Sanes M.D.   On: 11/11/2022 17:48     MDM - Istat elevated indicating pregnancy - White count mildly elevated. Starting hgb is 10.6 platelet count normal  - US revealed SAB in process.  - Discussed options for management of incomplete/missed AB including expectant management, Cytotec or D&C. Prefers Cytotec management at this time. Verbalizes  understanding that intervention may become necessary if SAB in not completed spontaneously or if heavy bleeding or infection occur in the next 24 hours. Patient verbalized understanding.  - 800 Cytotec ordered in MAU - Along with IM Toradol.  - Plan of care reviewed with MAU attending. MD agrees with management of care.  - Plan for discharge home.   Assessment and Plan   1. Incomplete miscarriage   2. Vaginal bleeding   3. [redacted] weeks gestation of pregnancy   4. Lower abdominal pain    - Reviewed that this is a miscarriage in process with patient.  - Bleeding expectations and worsening signs and return precautions reviewed.  - Repeat Cytotec dose in 24 hours if POC have not passed. Patient verbalizes understanding.  - Worsening signs and return precautions reviewed.  - Rx for Cytotec, Zofran and Roxicodone sent to outpatient pharmacy for pick up for management. - Patient discharged home in stable condition and may return to MAU as needed .   Jacquiline Doe, MSN CNM  11/11/2022, 6:48 PM

## 2022-11-11 NOTE — ED Notes (Signed)
Pt to MAU

## 2022-11-11 NOTE — ED Provider Triage Note (Cosign Needed Addendum)
Emergency Medicine Provider Triage Evaluation Note  Linda Duffy , a 37 y.o. female  was evaluated in triage.  Pt complains of vaginal bleeding that began today. Patient states she is passing large blood clots.  Patient thinks is related to her C-section she has had 5 previous C-sections and the pain is located suprapubically below her C-section scar.  Patient states her last menstrual period was back in January 2024 and does not know if she is pregnant.  Patient states she has been sexually active since her last period. patient states she has been nauseous and feels weak due to the blood loss.  Denies chest pain, shortness of breath, fevers, vision changes  Review of Systems  Positive: See HPI Negative: See HPI  Physical Exam  BP (!) 110/98 (BP Location: Right Arm)   Pulse 94   Temp 98.5 F (36.9 C) (Oral)   Resp 18   Ht 5\' 7"  (1.702 m)   Wt 75 kg   SpO2 100%   BMI 25.90 kg/m  Gen:   Awake, no distress   Resp:  Normal effort  MSK:   Moves extremities without difficulty  Other:  Patient showed a picture of a very large blood clot that was passed, patient tender to palpation suprapubically below her C-section scar, C-section scar does not look infected, lungs clear to auscultation, no murmurs rubs or gallops auscultated  Medical Decision Making  Medically screening exam initiated at 3:47 PM.  Appropriate orders placed.  Linda Duffy was informed that the remainder of the evaluation will be completed by another provider, this initial triage assessment does not replace that evaluation, and the importance of remaining in the ED until their evaluation is complete.  In patient's chart patient refused blood products and patient refused blood products today.  Workup initiated, patient stable at this time  Due to patient passing large blood clots and having excessive vaginal bleeding in triage, charge nurse was notified that patient will need to room.   Patient's i-STAT came positive for  pregnancy and patient will be transferred to MAU.  I spoke with Linda Duffy the APP at the MAU to let her know patient is coming.  Patient stable at this time.    Linda Hint, PA-C 11/11/22 1631

## 2022-11-11 NOTE — MAU Note (Signed)
.  Linda Duffy is a 37 y.o. at Unknown here in MAU reporting: around 0400 this morning she starting having heavy vaginal bleeding with large clots and lower abdominal pain. Patient was unaware she was pregnant.   Pain score: 10 Vitals:   11/11/22 1544 11/11/22 1549  BP: (!) 110/98 111/64  Pulse: 94   Resp: 18   Temp: 98.5 F (36.9 C)   SpO2: 100%    LMP: 08/19/22

## 2022-11-17 ENCOUNTER — Ambulatory Visit (INDEPENDENT_AMBULATORY_CARE_PROVIDER_SITE_OTHER): Payer: Medicaid Other | Admitting: Obstetrics and Gynecology

## 2022-11-17 ENCOUNTER — Other Ambulatory Visit: Payer: Self-pay

## 2022-11-17 ENCOUNTER — Encounter: Payer: Self-pay | Admitting: Obstetrics and Gynecology

## 2022-11-17 VITALS — BP 117/73 | HR 96 | Wt 170.0 lb

## 2022-11-17 DIAGNOSIS — O034 Incomplete spontaneous abortion without complication: Secondary | ICD-10-CM | POA: Diagnosis not present

## 2022-11-17 DIAGNOSIS — Z3A12 12 weeks gestation of pregnancy: Secondary | ICD-10-CM

## 2022-11-17 NOTE — Progress Notes (Unsigned)
    GYNECOLOGY VISIT  Patient name: Linda Duffy MRN [id number]  Date of birth: Dec 26, 1985 Chief Complaint:   Follow-up   History:  Linda Duffy is a 37 y.o. [id number] being seen today for ***.    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   History of gestational hypertension    Ovarian cyst 2012   Pyelonephritis    Sickle cell trait ([place])    Vaginal Pap smear, abnormal     Past Surgical History:  Procedure Laterality Date   CERVIX LESION DESTRUCTION     after 1st c-section and nl paps after that   CESAREAN SECTION     x1   CESAREAN SECTION N/A 05/24/2015   Procedure: REPEAT CESAREAN SECTION;  Surgeon: Frederico Hamman, MD;  Location: [facility];  Service: Obstetrics;  Laterality: N/A;   CESAREAN SECTION N/A 07/04/2016   Procedure: CESAREAN SECTION;  Surgeon: Lavonia Drafts, MD;  Location: [place];  Service: Obstetrics;  Laterality: N/A;   CESAREAN SECTION N/A 02/17/2018   Procedure: REPEAT CESAREAN SECTION;  Surgeon: Gwynne Edinger, MD;  Location: [place];  Service: Obstetrics;  Laterality: N/A;   CESAREAN SECTION N/A 11/10/2019   Procedure: CESAREAN SECTION;  Surgeon: Mora Bellman, MD;  Location: [facility];  Service: Obstetrics;  Laterality: N/A;   DILATION AND CURETTAGE OF UTERUS     x2 for EABs    The following portions of the patient's history were reviewed and updated as appropriate: allergies, current medications, past family history, past medical history, past social history, past surgical history and problem list.   Health Maintenance:   Last pap ***. Results were: {Pap findings:25134}. H/O abnormal pap: {yes/yes***/no:23866} Last mammogram: ***. Results were: {normal, abnormal, n/a:23837}. Family h/o breast cancer: {yes***/no:23838}   Review of Systems:  {Ros - complete:30496} Comprehensive review  of systems was otherwise negative.   Objective:  Physical Exam BP 117/73   Pulse 96   Wt 170 lb (77.1 kg)   LMP 08/19/2022   Breastfeeding Unknown   BMI 26.63 kg/m    Physical Exam   Labs and Imaging US OB LESS THAN 14 WEEKS WITH OB TRANSVAGINAL  Result Date: 11/11/2022 CLINICAL DATA:  Pregnant with heavy vaginal bleeding and abdominal pain. EXAM: OBSTETRIC <14 WK [place] AND TRANSVAGINAL OB US TECHNIQUE: Both transabdominal and transvaginal ultrasound examinations were performed for complete evaluation of the gestation as well as the maternal uterus, adnexal regions, and pelvic cul-de-sac. Transvaginal technique was performed to assess early pregnancy. COMPARISON:  None Available. FINDINGS: Intrauterine gestational sac: Irregular elongated gestational sac appears to be in the lower uterine segment/cervical canal. No yolk sac or embryonic pole. Findings consistent with on going spontaneous abortion. Subchorionic hemorrhage:  None visualized. Maternal uterus/adnexae: The ovaries are not visualized. No free pelvic fluid collections. IMPRESSION: Sonographic findings consistent with on going/impending spontaneous abortion. Electronically Signed   By: Marijo Sanes M.D.   On: 11/11/2022 17:48       Assessment & Plan:   1. Incomplete miscarriage ***    *** Routine preventative health maintenance measures emphasized.  Darliss Cheney, MD [facility] [place]

## 2022-11-18 LAB — BETA HCG QUANT (REF LAB): hCG Quant: 49 m[IU]/mL

## 2022-12-23 ENCOUNTER — Ambulatory Visit: Payer: Medicaid Other | Admitting: Obstetrics and Gynecology

## 2023-12-07 ENCOUNTER — Inpatient Hospital Stay (HOSPITAL_COMMUNITY)
Admission: AD | Admit: 2023-12-07 | Discharge: 2023-12-07 | Disposition: A | Discharge status: Home / Self Care | Attending: Obstetrics & Gynecology | Admitting: Obstetrics & Gynecology

## 2023-12-07 ENCOUNTER — Encounter (HOSPITAL_COMMUNITY): Payer: Self-pay | Admitting: *Deleted

## 2023-12-07 ENCOUNTER — Inpatient Hospital Stay (HOSPITAL_COMMUNITY)

## 2023-12-07 DIAGNOSIS — O26892 Other specified pregnancy related conditions, second trimester: Secondary | ICD-10-CM | POA: Insufficient documentation

## 2023-12-07 DIAGNOSIS — O09522 Supervision of elderly multigravida, second trimester: Secondary | ICD-10-CM | POA: Diagnosis not present

## 2023-12-07 DIAGNOSIS — D573 Sickle-cell trait: Secondary | ICD-10-CM | POA: Insufficient documentation

## 2023-12-07 DIAGNOSIS — O0932 Supervision of pregnancy with insufficient antenatal care, second trimester: Secondary | ICD-10-CM | POA: Diagnosis not present

## 2023-12-07 DIAGNOSIS — O34219 Maternal care for unspecified type scar from previous cesarean delivery: Secondary | ICD-10-CM | POA: Diagnosis not present

## 2023-12-07 DIAGNOSIS — O321XX0 Maternal care for breech presentation, not applicable or unspecified: Secondary | ICD-10-CM | POA: Diagnosis not present

## 2023-12-07 DIAGNOSIS — Z3200 Encounter for pregnancy test, result unknown: Secondary | ICD-10-CM

## 2023-12-07 DIAGNOSIS — Z3A25 25 weeks gestation of pregnancy: Secondary | ICD-10-CM

## 2023-12-07 DIAGNOSIS — Z87891 Personal history of nicotine dependence: Secondary | ICD-10-CM | POA: Insufficient documentation

## 2023-12-07 DIAGNOSIS — Z3A26 26 weeks gestation of pregnancy: Secondary | ICD-10-CM | POA: Insufficient documentation

## 2023-12-07 DIAGNOSIS — D649 Anemia, unspecified: Secondary | ICD-10-CM

## 2023-12-07 DIAGNOSIS — Z363 Encounter for antenatal screening for malformations: Secondary | ICD-10-CM

## 2023-12-07 DIAGNOSIS — Z711 Person with feared health complaint in whom no diagnosis is made: Secondary | ICD-10-CM

## 2023-12-07 DIAGNOSIS — O09292 Supervision of pregnancy with other poor reproductive or obstetric history, second trimester: Secondary | ICD-10-CM | POA: Insufficient documentation

## 2023-12-07 DIAGNOSIS — Z3492 Encounter for supervision of normal pregnancy, unspecified, second trimester: Secondary | ICD-10-CM

## 2023-12-07 DIAGNOSIS — O99012 Anemia complicating pregnancy, second trimester: Secondary | ICD-10-CM | POA: Insufficient documentation

## 2023-12-07 LAB — WET PREP, GENITAL
Clue Cells Wet Prep HPF POC: NONE SEEN
Sperm: NONE SEEN
Trich, Wet Prep: NONE SEEN
WBC, Wet Prep HPF POC: >=10 — AB (ref <10)
Yeast Wet Prep HPF POC: NONE SEEN

## 2023-12-07 LAB — DIFFERENTIAL
Abs Immature Granulocytes: 0 10*3/uL (ref 0.00–0.07)
Basophils Absolute: 0.1 10*3/uL (ref 0.0–0.1)
Basophils Relative: 1 %
Eosinophils Absolute: 0.1 10*3/uL (ref 0.0–0.5)
Eosinophils Relative: 1 %
Lymphocytes Relative: 34 %
Lymphs Abs: 5 10*3/uL — ABNORMAL HIGH (ref 0.7–4.0)
Monocytes Absolute: 0.1 10*3/uL (ref 0.1–1.0)
Monocytes Relative: 1 %
Neutro Abs: 9.3 10*3/uL — ABNORMAL HIGH (ref 1.7–7.7)
Neutrophils Relative %: 63 %
nRBC: 0 /100{WBCs}

## 2023-12-07 LAB — CBC
HCT: 27.1 % — ABNORMAL LOW (ref 36.0–46.0)
Hemoglobin: 9.1 g/dL — ABNORMAL LOW (ref 12.0–15.0)
MCH: 30.3 pg (ref 26.0–34.0)
MCHC: 33.6 g/dL (ref 30.0–36.0)
MCV: 90.3 fL (ref 80.0–100.0)
Platelets: 271 10*3/uL (ref 150–400)
RBC: 3 MIL/uL — ABNORMAL LOW (ref 3.87–5.11)
RDW: 14.8 % (ref 11.5–15.5)
WBC: 14.7 10*3/uL — ABNORMAL HIGH (ref 4.0–10.5)
nRBC: 0 % (ref 0.0–0.2)

## 2023-12-07 LAB — RAPID HIV SCREEN (HIV 1/2 AB+AG)
HIV 1/2 Antibodies: NONREACTIVE
HIV-1 P24 Antigen - HIV24: NONREACTIVE

## 2023-12-07 LAB — TYPE AND SCREEN
ABO/RH(D): O POS
Antibody Screen: NEGATIVE

## 2023-12-07 LAB — POCT PREGNANCY, URINE: Preg Test, Ur: POSITIVE — AB

## 2023-12-07 LAB — HEPATITIS B SURFACE ANTIGEN: Hepatitis B Surface Ag: NONREACTIVE

## 2023-12-07 MED ORDER — FERROUS SULFATE 325 (65 FE) MG PO TABS: 325.0000 mg | ORAL_TABLET | Freq: Every day | ORAL | 0 refills | Status: DC

## 2023-12-07 MED ORDER — ACETAMINOPHEN 500 MG PO TABS: 1000.0000 mg | ORAL_TABLET | Freq: Once | ORAL | Status: DC

## 2023-12-07 MED ORDER — PREPLUS 27-1 MG PO TABS: 1.0000 | ORAL_TABLET | Freq: Every day | ORAL | 13 refills | Status: AC

## 2023-12-07 NOTE — MAU Note (Signed)
.  Linda Duffy is a 38 y.o. at [redacted]w[redacted]d here in MAU reporting: lower abd pain and cramping on and off for a week. Deneis any vag discharge or bleeding.   LMP: 1/202/25 Onset of complaint: 1 week  Pain score: 9 Vitals:   12/07/23 1629  BP: 123/66  Pulse: 97  Resp: 18  Temp: 98.2 F (36.8 C)     FHT: 156  Lab orders placed from triage: wet , Gc

## 2023-12-07 NOTE — MAU Provider Note (Signed)
 History     CSN: [id number]  Arrival date and time: 12/07/23 1520   Event Date/Time   First Provider Initiated Contact with Patient 12/07/23 1727      Chief Complaint  Patient presents with   Abdominal Pain   Linda Duffy , a  38 y.o. [id number] at [redacted]w[redacted]d by uncertain dates presents to MAU with complaints of abdominal pain around previous c-section scar. Patient states that she is unsure of dating but reports a LMP in January of this year. She states that over the last week she has felt increased amounts of lower abdominal pain and pressure. Currently rates pain as a 5/10 and states that it is intermittent. Denies attempting to relieve symptoms. She denies abnormal vaginal discharge, vaginal bleeding or contractions. Patient believes she is 3rd trimester pregnant as she feels fetal movement. She reports not establishing any prenatal care.          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   History of gestational hypertension    Ovarian cyst 2012   Pyelonephritis    Sickle cell trait (HCC)    Vaginal Pap smear, abnormal     Past Surgical History:  Procedure Laterality Date   CERVIX LESION DESTRUCTION     after 1st c-section and nl paps after that   CESAREAN SECTION     x1   CESAREAN SECTION N/A 05/24/2015   Procedure: REPEAT CESAREAN SECTION;  Surgeon: Heide Livings, MD;  Location: [facility];  Service: Obstetrics;  Laterality: N/A;   CESAREAN SECTION N/A 07/04/2016   Procedure: CESAREAN SECTION;  Surgeon: Lenord Radon, MD;  Location: [facility] BIRTHING SUITES;  Service: Obstetrics;  Laterality: N/A;   CESAREAN SECTION N/A 02/17/2018   Procedure: REPEAT  CESAREAN SECTION;  Surgeon: Janeane Mealy, MD;  Location: [facility] BIRTHING SUITES;  Service: Obstetrics;  Laterality: N/A;   CESAREAN SECTION N/A 11/10/2019   Procedure: CESAREAN SECTION;  Surgeon: Verlyn Goad, MD;  Location: [facility] LD ORS;  Service: Obstetrics;  Laterality: N/A;   DILATION AND CURETTAGE OF UTERUS     x2 for EABs    Family History  Problem Relation Age of Onset   Diabetes Mother    Hypertension Mother    Heart disease Mother    Asthma Mother     Social History   Tobacco Use   Smoking status: Former    Current packs/day: 0.00    Types: Cigarettes    Quit date: 09/18/2014    Years since quitting: 9.2   Smokeless tobacco: Never  Vaping Use   Vaping status: Never Used  Substance Use Topics   Alcohol use: No   Drug use: Not Currently    Types: Marijuana    Comment: not since she found out she was pregnant    Allergies: No Known Allergies  Medications Prior to Admission  Medication Sig Dispense Refill Last Dose/Taking   aspirin  EC 81 MG tablet Take 1 tablet (81 mg total) by mouth daily. (Patient not taking: Reported on 02/01/2020) 90 tablet 1    Blood Pressure Monitoring (BLOOD PRESSURE KIT) DEVI 1 Device by Does not apply route as needed. (Patient not taking: Reported on 05/29/2020) 1 Device 0    gabapentin (NEURONTIN) 100 MG capsule Take 1 capsule (100 mg total) by mouth 3 (three) times daily. (Patient not taking: Reported on 02/01/2020) 30 capsule 0    ibuprofen (ADVIL) 800 MG tablet Take 1 tablet (800 mg total) by mouth every 8 (eight) hours. (Patient not taking: Reported on 02/01/2020) 90 tablet 0    loperamide (IMODIUM A-D) 2 MG tablet Take 1 tablet (2 mg total) by mouth 4 (four) times daily as needed for diarrhea or loose stools. (Patient not taking: Reported on 11/17/2022) 30 tablet 0    medroxyPROGESTERone (DEPO-PROVERA) 150 MG/ML injection Inject 150 mg into the muscle every 3 (three) months. (Patient not taking: Reported on 11/17/2022)      misoprostol (CYTOTEC)  200 MCG tablet Take 4 tablets (800 mcg total) by mouth once for 1 dose. 4 tablet 0    naproxen (NAPROSYN) 500 MG tablet Take 1 tablet (500 mg total) by mouth 2 (two) times daily. (Patient not taking: Reported on 05/29/2020) 30 tablet 0    NON FORMULARY Take 1 tablet by mouth See admin instructions. Every WomanT One Daily Multivitamin [place] Vitamins- Take 1 tablet by mouth once a day (Patient not taking: Reported on 05/29/2020)      ondansetron (ZOFRAN) 4 MG tablet Take 1 tablet (4 mg total) by mouth every 8 (eight) hours as needed for nausea or vomiting. 20 tablet 0    ondansetron (ZOFRAN-ODT) 4 MG disintegrating tablet Take 1 tablet (4 mg total) by mouth every 8 (eight) hours as needed for nausea or vomiting. 15 tablet 0    oxyCODONE-acetaminophen (PERCOCET/ROXICET) 5-325 MG tablet Take 1 tablet by mouth every 6 (six) hours as needed for severe pain. 6 tablet 0    Prenatal Vit-Fe Fumarate-FA (PREPLUS) 27-1 MG TABS Take 1 tablet by mouth daily. (Patient not taking: Reported on 02/01/2020) 30 tablet 6    PROBIOTIC PRODUCT PO Take 1 capsule by mouth daily.  (Patient not taking: Reported on 11/17/2022)       Review of Systems  Constitutional:  Negative for chills, fatigue and fever.  Eyes:  Negative for pain and visual disturbance.  Respiratory:  Negative for apnea, shortness of breath and wheezing.   Cardiovascular:  Negative for chest pain and palpitations.  Gastrointestinal:  Positive for abdominal pain. Negative for constipation, diarrhea, nausea and vomiting.  Genitourinary:  Positive for pelvic pain. Negative for difficulty urinating, dysuria, vaginal bleeding, vaginal discharge and vaginal pain.  Musculoskeletal:  Negative for back pain.  Neurological:  Negative for seizures, weakness and headaches.  Psychiatric/Behavioral:  Negative for suicidal ideas.    Physical Exam   Blood pressure 123/66, pulse 97, temperature 98.2 F (36.8 C), resp. rate 18, height 5\' 7"  (1.702 m), weight 72.6  kg, last menstrual period 09/14/2023, unknown if currently breastfeeding.  Physical Exam Vitals and nursing note reviewed.  Constitutional:      General: She is not in acute distress.    Appearance: Normal appearance.  HENT:     Head: Normocephalic.  Pulmonary:     Effort: Pulmonary effort is normal.  Abdominal:     General: There is distension.     Palpations: Abdomen is soft.     Tenderness:  There is abdominal tenderness in the suprapubic area. There is no guarding.     Comments: Gravid uterus; Fundal height measures ~[redacted] weeks gestation. Positive fetal movement noted on palpation.   Musculoskeletal:     Cervical back: Normal range of motion.  Skin:    General: Skin is warm and dry.  Neurological:     Mental Status: She is alert and oriented to person, place, and time.  Psychiatric:        Mood and Affect: Mood normal.    FHT easily obtained in triage.   MAU Course  Procedures Orders Placed This Encounter  Procedures   Wet prep, genital   [place] MFM OB Comp < 14 Weeks   Hepatitis B surface antigen   Rubella screen   RPR   CBC   Differential   Rapid HIV screen (HIV 1/2 Ab+Ag)   Pregnancy, urine POC   Type and screen [facility]   Results for orders placed or performed during the [facility]uffy encounter of 12/07/23 (from the past 72 hours)  Pregnancy, urine POC     Status: Abnormal   Collection Time: 12/07/23  3:48 PM  Result Value Ref Range   Preg Test, Ur POSITIVE (A) NEGATIVE    Comment:        THE SENSITIVITY OF THIS METHODOLOGY IS >24 mIU/mL   GC/Chlamydia probe amp ([place])not at [facility]     Status: None   Collection Time: 12/07/23  4:34 PM  Result Value Ref Range   Neisseria Gonorrhea Negative    Chlamydia Negative    Comment Normal Reference Ranger Chlamydia - Negative    Comment      Normal Reference Range Neisseria Gonorrhea - Negative  Wet prep, genital     Status: Abnormal   Collection Time: 12/07/23  4:38 PM  Result Value Ref Range    Yeast Wet Prep HPF POC NONE SEEN NONE SEEN   Trich, Wet Prep NONE SEEN NONE SEEN   Clue Cells Wet Prep HPF POC NONE SEEN NONE SEEN   WBC, Wet Prep HPF POC >=10 (A) <10   Sperm NONE SEEN     Comment: Performed at [facility], [address], [place], [place] [address]  Type and screen [facility]     Status: None   Collection Time: 12/07/23  6:25 PM  Result Value Ref Range   ABO/RH(D) O POS    Antibody Screen NEG    Sample Expiration      12/10/2023,2359 Performed at [facility], [address] [place], [place] [address]   Hepatitis B surface antigen     Status: None   Collection Time: 12/07/23  6:28 PM  Result Value Ref Range   Hepatitis B Surface Ag NON REACTIVE NON REACTIVE    Comment: Performed at [facility], [address], [place], [place] [address]  Rubella screen     Status: None   Collection Time: 12/07/23  6:28 PM  Result Value Ref Range   Rubella 1.62 Immune >0.99 index    Comment: (NOTE)                                Non-immune       <0.90  Equivocal  0.90 - 0.99                                Immune           >0.99 Performed At: [facility] [address] [place], [place] [id number] Jolene Schimke MD BJ:[id number]   RPR     Status: None   Collection Time: 12/07/23  6:28 PM  Result Value Ref Range   RPR Ser Ql NON REACTIVE NON REACTIVE    Comment: Performed at [facility], [address] [place], [place] [address]  CBC     Status: Abnormal   Collection Time: 12/07/23  6:28 PM  Result Value Ref Range   WBC 14.7 (H) 4.0 - 10.5 K/uL   RBC 3.00 (L) 3.87 - 5.11 MIL/uL   Hemoglobin 9.1 (L) 12.0 - 15.0 g/dL   HCT 84.6 (L) 96.2 - 95.2 %   MCV 90.3 80.0 - 100.0 fL   MCH 30.3 26.0 - 34.0 pg   MCHC 33.6 30.0 - 36.0 g/dL   RDW 84.1 32.4 - 40.1 %   Platelets 271 150 - 400 K/uL   nRBC 0.0 0.0 - 0.2 %    Comment: Performed at [facility], [address], [place], [place]  [address]  Differential     Status: Abnormal   Collection Time: 12/07/23  6:28 PM  Result Value Ref Range   Neutrophils Relative % 63 %   Neutro Abs 9.3 (H) 1.7 - 7.7 K/uL   Lymphocytes Relative 34 %   Lymphs Abs 5.0 (H) 0.7 - 4.0 K/uL   Monocytes Relative 1 %   Monocytes Absolute 0.1 0.1 - 1.0 K/uL   Eosinophils Relative 1 %   Eosinophils Absolute 0.1 0.0 - 0.5 K/uL   Basophils Relative 1 %   Basophils Absolute 0.1 0.0 - 0.1 K/uL   WBC Morphology See Note     Comment: Morphology unremarkable   RBC Morphology See Note     Comment: Morphology unremarkable   Smear Review See Note     Comment: Normal Platelet Morphology   nRBC 0 0 /100 WBC   Abs Immature Granulocytes 0.00 0.00 - 0.07 K/uL    Comment: Performed at [facility], [address] [place], [place] [address]  Rapid HIV screen (HIV 1/2 Ab+Ag)     Status: None   Collection Time: 12/07/23  6:28 PM  Result Value Ref Range   HIV-1 P24 Antigen - HIV24 NON REACTIVE NON REACTIVE    Comment: (NOTE) Detection of p24 may be inhibited by biotin in the sample, causing false negative results in acute infection.    HIV 1/2 Antibodies NON REACTIVE NON REACTIVE   Interpretation (HIV Ag Ab)      A non reactive test result means that HIV 1 or HIV 2 antibodies and HIV 1 p24 antigen were not detected in the specimen.    Comment: Performed at [facility], [address], [place], [place] [address]   [place] MFM OB COMP + 14 WK Result Date: 12/08/2023 ----------------------------------------------------------------------  OBSTETRICS REPORT                       (Signed Final 12/08/2023 12:14 pm) ---------------------------------------------------------------------- Patient Info  ID #:       [id number]  D.O.B.:  06-20-1986 (37 yrs)(F)  Name:       Linda Duffy                     Visit Date: 12/07/2023 07:06 pm ---------------------------------------------------------------------- Performed By  Attending:        Braxton Feathers DO       Referred By:      [facility] MAU/Triage  Performed By:     Hurman Horn          [facility] ---------------------------------------------------------------------- Orders  #  Description                           Code        Ordered By  1  [place] MFM OB COMP + 14 WK                40981.19    Ginelle Bays ----------------------------------------------------------------------  #  Order #                     Accession #                Episode #  1  [id number]                   [id number]                 [id number] ---------------------------------------------------------------------- Indications  Encounter for antenatal screening for          Z36.3  malformations  Encounter for uncertain dates                  Z36.87  Previous cesarean delivery, antepartum (x5)    O34.219  Advanced maternal age multigravida 88+,        O63.522  second trimester  Insufficient Prenatal Care (no pnc)            O09.30  Pelvic pain affecting pregnancy in second      O26.892  trimester  [redacted] weeks gestation of pregnancy                Z3A.26 ---------------------------------------------------------------------- Fetal Evaluation  Num Of Fetuses:         1  Fetal Heart Rate(bpm):  150  Cardiac Activity:       Observed  Presentation:           Breech  Placenta:               Anterior  P. Cord Insertion:      Visualized, central  Amniotic Fluid  AFI FV:      Within normal limits  Largest Pocket(cm)                              7.2 ---------------------------------------------------------------------- Biometry  BPD:      62.9  mm     G. Age:  25w 3d         23  %    CI:        73.18   %    70 - 86                                                          FL/HC:      19.9   %    18.6 - 20.4  HC:      233.7  mm     G. Age:  25w 3d         11  %    HC/AC:      1.01         1.04 - 1.22  AC:      232.1  mm     G. Age:  27w 4d         85  %    FL/BPD:     73.8   %    71 - 87  FL:       46.4  mm     G. Age:  25w 3d         20  %    FL/AC:      20.0   %    20 - 24  HUM:      44.1  mm     G. Age:  26w 1d         51  %  CER:      29.8  mm     G. Age:  26w 0d         47  %  LV:        2.7  mm  Est. FW:     942  gm      2 lb 1 oz     60  % ---------------------------------------------------------------------- OB History  Gravidity:    9         Term:   5  TOP:          2 ---------------------------------------------------------------------- Gestational Age  LMP:           12w 0d        Date:  09/14/23                  EDD:   06/20/24  U/S Today:     26w 0d                                        EDD:   03/14/24  Best:          26w 0d     Det. By:  [redacted] weeks gestation of    EDD:   03/14/24  pregnancy ---------------------------------------------------------------------- Anatomy  Cranium:               Appears normal         Aortic Arch:            Not well visualized  Cavum:                 Appears normal         Ductal Arch:            Not well visualized  Ventricles:            Appears normal         Diaphragm:              Appears normal  Choroid Plexus:        Appears normal         Stomach:                Appears normal, left                                                                        sided  Cerebellum:            Appears normal         Abdomen:                Appears normal  Posterior Fossa:       Appears normal         Abdominal Wall:         Not well visualized  Nuchal Fold:           Not applicable (>20    Cord Vessels:           Appears normal ([redacted]                         wks GA)                                        vessel cord)  Face:                  Not well visualized    Kidneys:                Appear normal  Lips:                  Not well visualized    Bladder:                Appears normal  Thoracic:              Appears normal          Spine:                  Appears normal  Heart:                 Appears normal         Upper Extremities:      Appears normal                         (  4CH, axis, and                         situs)  RVOT:                  Appears normal         Lower Extremities:      Appears normal  LVOT:                  Appears normal  Other:  Technically difficult due to fetal position. ---------------------------------------------------------------------- Cervix Uterus Adnexa  Cervix  Length:            4.4  cm.  Normal appearance by transabdominal scan  Uterus  No abnormality visualized.  Right Ovary  Not visualized.  Left Ovary  Within normal limits.  Adnexa  No abnormality visualized ---------------------------------------------------------------------- Comments  [facility] Ultrasound  The patient presented to the MAU for limited prenatal care  and uncertian dates.  Sonographic findings  Single intrauterine pregnancy at 26w 0d  Fetal cardiac activity: Observed and appears normal.  Presentation: Breech.  Limited fetal anatomy appears normal.  Amniotic fluid volume: Within normal limits. MVP: 7.2 cm.  Placenta: Anterior.  Recommendations  - EDD should be based on today's US .  - F/u growth US  in 4 weeks to confirm dates.  - Continue clinical management per OB provider.  This was a limited ultrasound with a remote read. If an official  MFM consult is requested for any reason please call/place an  order in Epic. ----------------------------------------------------------------------                  Penney Bowling, DO Electronically Signed Final Report   12/08/2023 12:14 pm ----------------------------------------------------------------------     MDM - Given patient previous C-Section and concern for 3rd trimester of pregnancy without Prenatal Care, US  and NOB labs collected.  - Hgb mildly low, recommended PO Iron supplement. Patient reports that she knows she is anemic.  - Remaining NOB labs pending upon discharge.  -  Single living IUP measuring about [redacted] weeks gestation. Consistent with Fundal heights.  - plan for discharge.   Assessment and Plan   1. Encounter for confirmation of pregnancy test result with physical examination   2. Physically well but worried   3. Anemia, unspecified type   4. Uncertain dates, antepartum, second trimester   5. [redacted] weeks gestation of pregnancy    - Reviewed expected 2nd trimester findings  - Recommended  a PO Iron supplement,  - Reviewed worsening signs and return precautions.  - Message sent to [facility] to get patient scheduled for a NOB at their earliest convenience .  - Patient discharged home in stable condition and may return to MAU as needed   Corie Diamond, MSN CNM  12/07/2023, 5:27 PM

## 2023-12-08 LAB — GC/CHLAMYDIA PROBE AMP (~~LOC~~) NOT AT ARMC
Chlamydia: NEGATIVE
Comment: NEGATIVE
Comment: NORMAL
Neisseria Gonorrhea: NEGATIVE

## 2023-12-08 LAB — RPR: RPR Ser Ql: NONREACTIVE

## 2023-12-08 LAB — RUBELLA SCREEN: Rubella: 1.62 {index} (ref >0.99)

## 2023-12-24 ENCOUNTER — Other Ambulatory Visit: Payer: Self-pay

## 2023-12-24 ENCOUNTER — Ambulatory Visit: Admitting: Advanced Practice Midwife

## 2023-12-24 ENCOUNTER — Other Ambulatory Visit (HOSPITAL_COMMUNITY)
Admission: RE | Admit: 2023-12-24 | Discharge: 2023-12-24 | Disposition: A | Discharge status: Home / Self Care | Source: Ambulatory Visit | Attending: Advanced Practice Midwife | Admitting: Advanced Practice Midwife

## 2023-12-24 ENCOUNTER — Encounter: Payer: Self-pay | Admitting: Advanced Practice Midwife

## 2023-12-24 VITALS — BP 107/69 | HR 93 | Wt 159.5 lb

## 2023-12-24 DIAGNOSIS — Z531 Procedure and treatment not carried out because of patient's decision for reasons of belief and group pressure: Secondary | ICD-10-CM

## 2023-12-24 DIAGNOSIS — Z98891 History of uterine scar from previous surgery: Secondary | ICD-10-CM | POA: Diagnosis not present

## 2023-12-24 DIAGNOSIS — Z8679 Personal history of other diseases of the circulatory system: Secondary | ICD-10-CM

## 2023-12-24 DIAGNOSIS — O09522 Supervision of elderly multigravida, second trimester: Secondary | ICD-10-CM

## 2023-12-24 DIAGNOSIS — D573 Sickle-cell trait: Secondary | ICD-10-CM | POA: Diagnosis not present

## 2023-12-24 DIAGNOSIS — Z1332 Encounter for screening for maternal depression: Secondary | ICD-10-CM

## 2023-12-24 DIAGNOSIS — Z3A27 27 weeks gestation of pregnancy: Secondary | ICD-10-CM | POA: Diagnosis not present

## 2023-12-24 DIAGNOSIS — Z23 Encounter for immunization: Secondary | ICD-10-CM

## 2023-12-24 DIAGNOSIS — O0990 Supervision of high risk pregnancy, unspecified, unspecified trimester: Secondary | ICD-10-CM | POA: Diagnosis not present

## 2023-12-24 DIAGNOSIS — Z8759 Personal history of other complications of pregnancy, childbirth and the puerperium: Secondary | ICD-10-CM

## 2023-12-24 DIAGNOSIS — O99012 Anemia complicating pregnancy, second trimester: Secondary | ICD-10-CM | POA: Diagnosis not present

## 2023-12-24 DIAGNOSIS — Z9229 Personal history of other drug therapy: Secondary | ICD-10-CM | POA: Diagnosis not present

## 2023-12-24 DIAGNOSIS — O0992 Supervision of high risk pregnancy, unspecified, second trimester: Secondary | ICD-10-CM | POA: Insufficient documentation

## 2023-12-24 DIAGNOSIS — O0932 Supervision of pregnancy with insufficient antenatal care, second trimester: Secondary | ICD-10-CM | POA: Diagnosis not present

## 2023-12-24 DIAGNOSIS — D509 Iron deficiency anemia, unspecified: Secondary | ICD-10-CM

## 2023-12-24 MED ORDER — ASPIRIN EC 81 MG PO TBEC: 81.0000 mg | DELAYED_RELEASE_TABLET | Freq: Every day | ORAL | 1 refills | Status: DC

## 2023-12-24 NOTE — Patient Instructions (Signed)
 TDaP Vaccine Pregnancy Get the Whooping Cough Vaccine While You Are Pregnant (CDC)  It is important for women to get the whooping cough vaccine in the third trimester of each pregnancy. Vaccines are the best way to prevent this disease. There are 2 different whooping cough vaccines. Both vaccines combine protection against whooping cough, tetanus and diphtheria, but they are for different age groups: Tdap: for everyone 11 years or older, including pregnant women  DTaP: for children 2 months through 10 years of age  You need the whooping cough vaccine during each of your pregnancies The recommended time to get the shot is during your 27th through 36th week of pregnancy, preferably during the earlier part of this time period. The Centers for Disease Control and Prevention (CDC) recommends that pregnant women receive the whooping cough vaccine for adolescents and adults (called Tdap vaccine) during the third trimester of each pregnancy. The recommended time to get the shot is during your 27th through 36th week of pregnancy, preferably during the earlier part of this time period. This replaces the original recommendation that pregnant women get the vaccine only if they had not previously received it. The [employer] of [employer]stetricians and Gynecologists and the [employer] support this recommendation.  You should get the whooping cough vaccine while pregnant to pass protection to your baby frame support disabled and/or not supported in this browser  Learn why Linda Duffy decided to get the whooping cough vaccine in her 3rd trimester of pregnancy and how her baby girl was born with some protection against the disease. Also available on YouTube. After receiving the whooping cough vaccine, your body will create protective antibodies (proteins produced by the body to fight off diseases) and pass some of them to your baby before birth. These antibodies provide your baby some short-term  protection against whooping cough in early life. These antibodies can also protect your baby from some of the more serious complications that come along with whooping cough. Your protective antibodies are at their highest about 2 weeks after getting the vaccine, but it takes time to pass them to your baby. So the preferred time to get the whooping cough vaccine is early in your third trimester. The amount of whooping cough antibodies in your body decreases over time. That is why CDC recommends you get a whooping cough vaccine during each pregnancy. Doing so allows each of your babies to get the greatest number of protective antibodies from you. This means each of your babies will get the best protection possible against this disease.  Getting the whooping cough vaccine while pregnant is better than getting the vaccine after you give birth Whooping cough vaccination during pregnancy is ideal so your baby will have short-term protection as soon as he is born. This early protection is important because your baby will not start getting his whooping cough vaccines until he is 2 months old. These first few months of life are when your baby is at greatest risk for catching whooping cough. This is also when he's at greatest risk for having severe, potentially life-threating complications from the infection. To avoid that gap in protection, it is best to get a whooping cough vaccine during pregnancy. You will then pass protection to your baby before he is born. To continue protecting your baby, he should get whooping cough vaccines starting at 2 months old. You may never have gotten the Tdap vaccine before and did not get it during this pregnancy. If so, you should make sure  to get the vaccine immediately after you give birth, before leaving the hospital or birthing center. It will take about 2 weeks before your body develops protection (antibodies) in response to the vaccine. Once you have protection from the vaccine,  you are less likely to give whooping cough to your newborn while caring for him. But remember, your baby will still be at risk for catching whooping cough from others. A recent study looked to see how effective Tdap was at preventing whooping cough in babies whose mothers got the vaccine while pregnant or in the hospital after giving birth. The study found that getting Tdap between 27 through 36 weeks of pregnancy is 85% more effective at preventing whooping cough in babies younger than 2 months old. Blood tests cannot tell if you need a whooping cough vaccine There are no blood tests that can tell you if you have enough antibodies in your body to protect yourself or your baby against whooping cough. Even if you have been sick with whooping cough in the past or previously received the vaccine, you still should get the vaccine during each pregnancy. Breastfeeding may pass some protective antibodies onto your baby By breastfeeding, you may pass some antibodies you have made in response to the vaccine to your baby. When you get a whooping cough vaccine during your pregnancy, you will have antibodies in your breast milk that you can share with your baby as soon as your milk comes in. However, your baby will not get protective antibodies immediately if you wait to get the whooping cough vaccine until after delivering your baby. This is because it takes about 2 weeks for your body to create antibodies. Learn more about the health benefits of breastfeeding.

## 2023-12-24 NOTE — Progress Notes (Signed)
 Follow Up US  scheduled for 01/22/24 @ 9am.

## 2023-12-24 NOTE — Progress Notes (Signed)
 INITIAL PRENATAL VISIT  Subjective:   Linda Duffy is 38 y.o. [phone] female being seen today for her first obstetrical visit. She  has not  received other prenatal care previously this pregnancy. This is not a planned pregnancy. This is a desired pregnancy.  She is at [redacted]w[redacted]d gestation by 26 week US . Her obstetrical history is significant for  C/S x 5, PP HTN, severe anemia necessitating Iron infusion . Declines blood products unless in life-threatening situation.  Relationship with FOB: significant other, living together. Patient does intend to breast feed. Pregnancy history fully reviewed.  Review of Systems:   ROS no complaints.  Objective:    Obstetric History OB History  Gravida Para Term Preterm AB Living  9 5 5  0 3 5  SAB IAB Ectopic Multiple Live Births  1 2 0 0 5    # Outcome Date GA Lbr Len/2nd Weight Sex Type Anes PTL Lv  9 Current           8 Term 11/10/19 110w0d  6 lb 1.9 oz (2.775 kg) F CS-LTranv Spinal  LIV  7 Term 02/17/18 [redacted]w[redacted]d  6 lb (2.722 kg) F CS-Unspec Spinal  LIV     Birth Comments: repeat c/s , anemia had blood clots in legs post op  6 Term 07/04/16 [redacted]w[redacted]d  7 lb 3 oz (3.26 kg) M CS-LTranv Spinal  LIV     Birth Comments: no complications except anemic  5 Term 05/24/15 [redacted]w[redacted]d  5 lb 14 oz (2.665 kg) F CS-Vac Spinal  LIV     Birth Comments: Normal exam.  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   History of postpartum hypertension    Ovarian cyst 2012   Pyelonephritis    Sickle cell trait (HCC)    Vaginal Pap smear, abnormal     Past Surgical History:  Procedure Laterality Date   CERVIX LESION DESTRUCTION     after 1st c-section and nl paps after that   CESAREAN  SECTION     x1   CESAREAN SECTION N/A 05/24/2015   Procedure: REPEAT CESAREAN SECTION;  Surgeon: Heide Livings, MD;  Location: [facility] ORS;  Service: Obstetrics;  Laterality: N/A;   CESAREAN SECTION N/A 07/04/2016   Procedure: CESAREAN SECTION;  Surgeon: Lenord Radon, MD;  Location: [facility] BIRTHING SUITES;  Service: Obstetrics;  Laterality: N/A;   CESAREAN SECTION N/A 02/17/2018   Procedure: REPEAT CESAREAN SECTION;  Surgeon: Janeane Mealy, MD;  Location: [facility] BIRTHING SUITES;  Service: Obstetrics;  Laterality: N/A;   CESAREAN SECTION N/A 11/10/2019   Procedure: CESAREAN SECTION;  Surgeon: Verlyn Goad, MD;  Location: [facility] LD ORS;  Service: Obstetrics;  Laterality: N/A;   DILATION AND CURETTAGE OF UTERUS     x2 for EABs    Current Outpatient Medications on File Prior to Visit  Medication Sig Dispense Refill   Prenatal Vit-Fe Fumarate-FA (PREPLUS) 27-1 MG TABS Take 1 tablet by mouth daily. 30 tablet 13  aspirin  EC 81 MG tablet Take 1 tablet (81 mg total) by mouth daily. (Patient not taking: Reported on 02/01/2020) 90 tablet 1   Blood Pressure Monitoring (BLOOD PRESSURE KIT) DEVI 1 Device by Does not apply route as needed. (Patient not taking: Reported on 05/29/2020) 1 Device 0   ferrous sulfate  325 (65 FE) MG tablet Take 1 tablet (325 mg total) by mouth daily. (Patient not taking: Reported on 12/24/2023) 30 tablet 0   gabapentin  (NEURONTIN ) 100 MG capsule Take 1 capsule (100 mg total) by mouth 3 (three) times daily. (Patient not taking: Reported on 02/01/2020) 30 capsule 0   ibuprofen  (ADVIL ) 800 MG tablet Take 1 tablet (800 mg total) by mouth every 8 (eight) hours. (Patient not taking: Reported on 02/01/2020) 90 tablet 0   loperamide  (IMODIUM  A-D) 2 MG tablet Take 1 tablet (2 mg total) by mouth 4 (four) times daily as needed for diarrhea or loose stools. (Patient not taking: Reported on 12/24/2023) 30 tablet 0   medroxyPROGESTERone  (DEPO-PROVERA ) 150 MG/ML injection Inject 150 mg into the muscle  every 3 (three) months. (Patient not taking: Reported on 11/17/2022)     misoprostol  (CYTOTEC ) 200 MCG tablet Take 4 tablets (800 mcg total) by mouth once for 1 dose. 4 tablet 0   naproxen  (NAPROSYN ) 500 MG tablet Take 1 tablet (500 mg total) by mouth 2 (two) times daily. (Patient not taking: Reported on 12/24/2023) 30 tablet 0   NON FORMULARY Take 1 tablet by mouth See admin instructions. Every WomanT One Daily Multivitamin [place] Vitamins- Take 1 tablet by mouth once a day (Patient not taking: Reported on 05/29/2020)     ondansetron  (ZOFRAN ) 4 MG tablet Take 1 tablet (4 mg total) by mouth every 8 (eight) hours as needed for nausea or vomiting. (Patient not taking: Reported on 12/24/2023) 20 tablet 0   ondansetron  (ZOFRAN -ODT) 4 MG disintegrating tablet Take 1 tablet (4 mg total) by mouth every 8 (eight) hours as needed for nausea or vomiting. (Patient not taking: Reported on 12/24/2023) 15 tablet 0   oxyCODONE -acetaminophen  (PERCOCET/ROXICET) 5-325 MG tablet Take 1 tablet by mouth every 6 (six) hours as needed for severe pain. (Patient not taking: Reported on 12/24/2023) 6 tablet 0   Prenatal Vit-Fe Fumarate-FA (PREPLUS) 27-1 MG TABS Take 1 tablet by mouth daily. (Patient not taking: Reported on 12/24/2023) 30 tablet 6   PROBIOTIC PRODUCT PO Take 1 capsule by mouth daily.  (Patient not taking: Reported on 11/17/2022)     No current facility-administered medications on file prior to visit.    No Known Allergies  Social History:  reports that she quit smoking about 9 years ago. Her smoking use included cigarettes. She has never used smokeless tobacco. She reports that she does not currently use drugs after having used the following drugs: Marijuana. She reports that she does not drink alcohol.  Family History  Problem Relation Age of Onset   Diabetes Mother    Hypertension Mother    Heart disease Mother    Asthma Mother     The following portions of the patient's history were reviewed and updated  as appropriate: allergies, current medications, past family history, past medical history, past social history, past surgical history and problem list.  Physical Exam:  BP 107/69   Pulse 93   Wt 159 lb 8 oz (72.3 kg)   LMP 09/14/2023   BMI 24.98 kg/m  CONSTITUTIONAL: Well-developed, well-nourished female in no acute distress.  HENT:  Normocephalic, atraumatic. Oropharynx is clear and  moist EYES: Conjunctivae normal. No scleral icterus.  SKIN: Skin is warm and dry. No rash noted. Not diaphoretic. No erythema. No pallor. MUSCULOSKELETAL: Normal range of motion. No tenderness.  No cyanosis, clubbing, or edema.   NEUROLOGIC: Alert and oriented to person, place, and time. Normal muscle tone coordination.  PSYCHIATRIC: Normal mood and affect. Normal behavior. Normal judgment and thought content. CARDIOVASCULAR: Normal heart rate noted. RESPIRATORY: Effort and rate normal. BREASTS: Declined ABDOMEN: Soft, no distention, tenderness, rebound or guarding. Fundal ht: 27 PELVIC: Normal appearing external genitalia; normal appearing vaginal mucosa and cervix.  No abnormal discharge noted.  Pap smear obtained.  Uterus S=D, no other palpable masses, no uterine or adnexal tenderness. Fetal Status: Fetal Heart Rate (bpm): 147   Movement: Present     Indications for ASA therapy (per uptodate) Two or more of the following: Age >=35 years Yes Sociodemographic characteristics (African American race, low socioeconomic level) Yes Personal risk factors (eg, previous pregnancy with low birth weight or small for gestational age infant, previous adverse pregnancy outcome [eg, stillbirth], interval >10 years between pregnancies) Yes   Assessment:   Pregnancy: L2G4010 1. [redacted] weeks gestation of pregnancy (Primary) - Culture, OB Urine - GC/Chlamydia probe amp ([place])not at [facility] - CBC/D/Plt+RPR+Rh+ABO+RubIgG... - Hemoglobin A1c - PANORAMA PRENATAL TEST - [place] Basic Panel - Comp Met (CMET) - Protein /  creatinine ratio, urine - Glucose tolerance, 1 hour - Tdap vaccine greater than or equal to 7yo IM - US  MFM OB FOLLOW UP; Future  2. History of cesarean section  3. Late prenatal care affecting pregnancy in second trimester  4. Supervision of high risk pregnancy, antepartum - Culture, OB Urine - GC/Chlamydia probe amp ([place])not at [facility] - CBC/D/Plt+RPR+Rh+ABO+RubIgG... - Hemoglobin A1c - PANORAMA PRENATAL TEST - [place] Basic Panel - Comp Met (CMET) - Protein / creatinine ratio, urine  5. Hx of cesarean section  6. History of postpartum hypertension - Comp Met (CMET) - Protein / creatinine ratio, urine  7. Multigravida of advanced maternal age in second trimester  8. Sickle cell trait in mother affecting pregnancy (HCC) - [place] Basic Panel  9. No transfusions per religious beliefs  10. Anemia affecting pregnancy in second trimester      Plan:  Initial labs drawn Prenatal vitamins. Rx ASA for reduction of risk for preeclampsia.  Problem list reviewed and updated. Genetic screening discussed: NIPS/First trimester screen/Quad/AFP ordered. Role of ultrasound in pregnancy discussed; Anatomy US : results reviewed. Amniocentesis discussed: declined. Follow up in 2 weeks. (Traditional Discussed clinic routines, schedule of care and testing, genetic screening options, involvement of students and residents under the direct supervision of APPs and doctors and presence of female providers. Pt verbalized understanding.  Future Appointments  Date Time Provider Department Center  01/12/2024  1:55 PM Donnis Galeazzi [facility]  01/22/2024  9:00 AM [facility] PROVIDER 1 [facility]  01/22/2024  9:30 AM [facility] US3 [facility]     Anyae Griffith  Benard Brackett 12/24/2023 3:37 PM

## 2023-12-25 LAB — PROTEIN / CREATININE RATIO, URINE
Creatinine, Urine: 106.7 mg/dL
Protein, Ur: 13.2 mg/dL
Protein/Creat Ratio: 124 mg/g{creat} (ref 0–200)

## 2023-12-25 LAB — GC/CHLAMYDIA PROBE AMP (~~LOC~~) NOT AT ARMC
Chlamydia: NEGATIVE
Comment: NEGATIVE
Comment: NORMAL
Neisseria Gonorrhea: NEGATIVE

## 2023-12-26 LAB — CBC/D/PLT+RPR+RH+ABO+RUBIGG...
Antibody Screen: NEGATIVE
Basophils Absolute: 0.1 10*3/uL (ref 0.0–0.2)
Basos: 1 %
EOS (ABSOLUTE): 0.2 10*3/uL (ref 0.0–0.4)
Eos: 1 %
HCV Ab: NONREACTIVE
HIV Screen 4th Generation wRfx: NONREACTIVE
Hematocrit: 25.9 % — ABNORMAL LOW (ref 34.0–46.6)
Hemoglobin: 8.3 g/dL — ABNORMAL LOW (ref 11.1–15.9)
Hepatitis B Surface Ag: NEGATIVE
Immature Grans (Abs): 0.1 10*3/uL (ref 0.0–0.1)
Immature Granulocytes: 1 %
Lymphocytes Absolute: 3.3 10*3/uL — ABNORMAL HIGH (ref 0.7–3.1)
Lymphs: 24 %
MCH: 29.4 pg (ref 26.6–33.0)
MCHC: 32 g/dL (ref 31.5–35.7)
MCV: 92 fL (ref 79–97)
Monocytes Absolute: 0.8 10*3/uL (ref 0.1–0.9)
Monocytes: 6 %
Neutrophils Absolute: 9.2 10*3/uL — ABNORMAL HIGH (ref 1.4–7.0)
Neutrophils: 67 %
Platelets: 265 10*3/uL (ref 150–450)
RBC: 2.82 x10E6/uL — ABNORMAL LOW (ref 3.77–5.28)
RDW: 15 % (ref 11.7–15.4)
RPR Ser Ql: NONREACTIVE
Rh Factor: POSITIVE
Rubella Antibodies, IGG: 1.33 {index} (ref >0.99)
WBC: 13.6 10*3/uL — ABNORMAL HIGH (ref 3.4–10.8)

## 2023-12-26 LAB — HEMOGLOBIN A1C
Est. average glucose Bld gHb Est-mCnc: 94 mg/dL
Hgb A1c MFr Bld: 4.9 % (ref 4.8–5.6)

## 2023-12-26 LAB — HCV INTERPRETATION

## 2023-12-26 LAB — COMPREHENSIVE METABOLIC PANEL WITH GFR
ALT: 6 IU/L (ref 0–32)
AST: 12 IU/L (ref 0–40)
Albumin: 3.5 g/dL — ABNORMAL LOW (ref 3.9–4.9)
Alkaline Phosphatase: 112 IU/L (ref 44–121)
BUN/Creatinine Ratio: 5 — ABNORMAL LOW (ref 9–23)
BUN: 3 mg/dL — ABNORMAL LOW (ref 6–20)
Bilirubin Total: 0.2 mg/dL (ref 0.0–1.2)
CO2: 18 mmol/L — ABNORMAL LOW (ref 20–29)
Calcium: 8.9 mg/dL (ref 8.7–10.2)
Chloride: 105 mmol/L (ref 96–106)
Creatinine, Ser: 0.57 mg/dL (ref 0.57–1.00)
Globulin, Total: 2.6 g/dL (ref 1.5–4.5)
Glucose: 128 mg/dL — ABNORMAL HIGH (ref 70–99)
Potassium: 3.5 mmol/L (ref 3.5–5.2)
Sodium: 138 mmol/L (ref 134–144)
Total Protein: 6.1 g/dL (ref 6.0–8.5)
eGFR: 118 mL/min/{1.73_m2} (ref >59)

## 2023-12-26 LAB — URINE CULTURE, OB REFLEX

## 2023-12-26 LAB — GLUCOSE TOLERANCE, 1 HOUR: Glucose, 1Hr PP: 122 mg/dL (ref 70–199)

## 2023-12-26 LAB — CULTURE, OB URINE

## 2023-12-31 LAB — PANORAMA PRENATAL TEST FULL PANEL:PANORAMA TEST PLUS 5 ADDITIONAL MICRODELETIONS: FETAL FRACTION: 21.1

## 2024-01-01 LAB — HORIZON CUSTOM: REPORT SUMMARY: POSITIVE — AB

## 2024-01-05 ENCOUNTER — Ambulatory Visit: Payer: Self-pay | Admitting: Advanced Practice Midwife

## 2024-01-06 ENCOUNTER — Encounter: Payer: Self-pay | Admitting: Advanced Practice Midwife

## 2024-01-06 ENCOUNTER — Telehealth: Payer: Self-pay

## 2024-01-06 DIAGNOSIS — D509 Iron deficiency anemia, unspecified: Secondary | ICD-10-CM | POA: Insufficient documentation

## 2024-01-06 NOTE — Addendum Note (Signed)
 Addended by: Felipe Horton, Eloina Ergle  on: 01/06/2024 01:30 PM   Modules accepted: Orders

## 2024-01-06 NOTE — Telephone Encounter (Signed)
 [place] , patient will be scheduled as soon as possible.  Auth Submission: NO AUTH NEEDED Site of care: Site of care: [facility] Payer: [place] [employer] Medication & CPT/J Code(s) submitted: Venofer (Iron Sucrose) J1756 Route of submission (phone, fax, portal):  Phone # Fax # Auth type: Buy/Bill PB Units/visits requested: 200mg  x 5 doses Reference number:  Approval from: 01/06/24 to 05/08/24

## 2024-01-12 ENCOUNTER — Ambulatory Visit: Admitting: Certified Nurse Midwife

## 2024-01-12 ENCOUNTER — Other Ambulatory Visit: Payer: Self-pay

## 2024-01-12 VITALS — BP 116/75 | HR 102 | Wt 163.4 lb

## 2024-01-12 DIAGNOSIS — Z8679 Personal history of other diseases of the circulatory system: Secondary | ICD-10-CM

## 2024-01-12 DIAGNOSIS — O0993 Supervision of high risk pregnancy, unspecified, third trimester: Secondary | ICD-10-CM | POA: Diagnosis not present

## 2024-01-12 DIAGNOSIS — Z3A30 30 weeks gestation of pregnancy: Secondary | ICD-10-CM

## 2024-01-12 DIAGNOSIS — O09523 Supervision of elderly multigravida, third trimester: Secondary | ICD-10-CM

## 2024-01-12 DIAGNOSIS — D649 Anemia, unspecified: Secondary | ICD-10-CM | POA: Diagnosis not present

## 2024-01-13 LAB — CBC
Hematocrit: 25.4 % — ABNORMAL LOW (ref 34.0–46.6)
Hemoglobin: 8.2 g/dL — ABNORMAL LOW (ref 11.1–15.9)
MCH: 30.7 pg (ref 26.6–33.0)
MCHC: 32.3 g/dL (ref 31.5–35.7)
MCV: 95 fL (ref 79–97)
Platelets: 254 10*3/uL (ref 150–450)
RBC: 2.67 x10E6/uL — CL (ref 3.77–5.28)
RDW: 15.6 % — ABNORMAL HIGH (ref 11.7–15.4)
WBC: 11.7 10*3/uL — ABNORMAL HIGH (ref 3.4–10.8)

## 2024-01-13 LAB — IRON,TIBC AND FERRITIN PANEL
Ferritin: 30 ng/mL (ref 15–150)
Iron Saturation: 31 % (ref 15–55)
Iron: 146 ug/dL (ref 27–159)
Total Iron Binding Capacity: 465 ug/dL — ABNORMAL HIGH (ref 250–450)
UIBC: 319 ug/dL (ref 131–425)

## 2024-01-14 ENCOUNTER — Encounter: Admitting: Obstetrics and Gynecology

## 2024-01-14 NOTE — Progress Notes (Signed)
   PRENATAL VISIT NOTE  Subjective:  Linda Duffy is a 38 y.o. J4N8295 at [redacted]w[redacted]d being seen today for ongoing prenatal care.  She is currently monitored for the following issues for this high-risk pregnancy and has Late prenatal care affecting pregnancy in second trimester; Anemia affecting pregnancy in second trimester; Sickle cell trait in mother affecting pregnancy (HCC); Advanced maternal age in multigravida; History of postpartum hypertension; Hx of cesarean section; No transfusions per religious beliefs; History of group B Streptococcus (GBS) infection; Postoperative anemia; Supervision of high risk pregnancy, antepartum; and Iron deficiency anemia during pregnancy on their problem list.  Patient reports no complaints.   . Vag. Bleeding: None.  Movement: Present. Denies leaking of fluid.   The following portions of the patient's history were reviewed and updated as appropriate: allergies, current medications, past family history, past medical history, past social history, past surgical history and problem list.   Objective:    Vitals:   01/12/24 1403  BP: 116/75  Pulse: (!) 102  Weight: 163 lb 6.4 oz (74.1 kg)    Fetal Status:  Fetal Heart Rate (bpm): 147 Fundal Height: 30 cm Movement: Present    General: Alert, oriented and cooperative. Patient is in no acute distress.  Skin: Skin is warm and dry. No rash noted.   Cardiovascular: Normal heart rate noted  Respiratory: Normal respiratory effort, no problems with respiration noted  Abdomen: Soft, gravid, appropriate for gestational age.  Pain/Pressure: Present     Pelvic: Cervical exam deferred        Extremities: Normal range of motion.  Edema: None  Mental Status: Normal mood and affect. Normal behavior. Normal judgment and thought content.   Assessment and Plan:  Pregnancy: A2Z3086 at [redacted]w[redacted]d 1. Supervision of high risk pregnancy in third trimester (Primary) - Patient overall doing well.  - Reports vigorous fetal movement   2.  [redacted] weeks gestation of pregnancy - Fundal height appropriate for gestational age today.   3. Multigravida of advanced maternal age in third trimester -  US  scheduled for 01/22/24  4. Anemia, unspecified type - Orders placed for Iron Transfusion  - Baseline labs ordered  - CBC - Iron, TIBC and Ferritin Panel  5. History of postpartum hypertension - BPs stable at this time   Preterm labor symptoms and general obstetric precautions including but not limited to vaginal bleeding, contractions, leaking of fluid and fetal movement were reviewed in detail with the patient. Please refer to After Visit Summary for other counseling recommendations.   Return in about 2 weeks (around 01/26/2024) for HROB.  Future Appointments  Date Time Provider Department Center  01/22/2024  9:00 AM [facility] PROVIDER 1 [facility]  01/22/2024  9:30 AM [facility] US3 [facility]  01/26/2024  2:15 PM Curlie Doughty [facility]  02/09/2024  2:35 PM Zelma Hidden, FNP [facility]  02/23/2024  2:15 PM Curlie Doughty [facility]  03/07/2024  1:55 PM Ebony Goldstein, MD [facility]  03/14/2024  1:15 PM Noreene Bearded, PA [facility]  03/21/2024  2:35 PM Cresenzo, Mardee Shackle, MD [facility]    Kendyn Zaman Maurie Southern) Marlys Singh, MSN, CNM  Center for [facility]  01/14/2024 11:58 PM

## 2024-01-22 ENCOUNTER — Ambulatory Visit: Attending: Advanced Practice Midwife

## 2024-01-22 ENCOUNTER — Other Ambulatory Visit: Payer: Self-pay | Admitting: *Deleted

## 2024-01-22 ENCOUNTER — Ambulatory Visit: Admitting: Obstetrics and Gynecology

## 2024-01-22 ENCOUNTER — Ambulatory Visit: Payer: Self-pay | Admitting: Certified Nurse Midwife

## 2024-01-22 VITALS — BP 116/65 | HR 98

## 2024-01-22 DIAGNOSIS — O34219 Maternal care for unspecified type scar from previous cesarean delivery: Secondary | ICD-10-CM | POA: Insufficient documentation

## 2024-01-22 DIAGNOSIS — Z98891 History of uterine scar from previous surgery: Secondary | ICD-10-CM

## 2024-01-22 DIAGNOSIS — O0932 Supervision of pregnancy with insufficient antenatal care, second trimester: Secondary | ICD-10-CM

## 2024-01-22 DIAGNOSIS — Z362 Encounter for other antenatal screening follow-up: Secondary | ICD-10-CM | POA: Diagnosis not present

## 2024-01-22 DIAGNOSIS — O09293 Supervision of pregnancy with other poor reproductive or obstetric history, third trimester: Secondary | ICD-10-CM | POA: Diagnosis not present

## 2024-01-22 DIAGNOSIS — O09523 Supervision of elderly multigravida, third trimester: Secondary | ICD-10-CM | POA: Insufficient documentation

## 2024-01-22 DIAGNOSIS — Z3A32 32 weeks gestation of pregnancy: Secondary | ICD-10-CM | POA: Diagnosis not present

## 2024-01-22 DIAGNOSIS — O0993 Supervision of high risk pregnancy, unspecified, third trimester: Secondary | ICD-10-CM | POA: Diagnosis not present

## 2024-01-22 DIAGNOSIS — O0990 Supervision of high risk pregnancy, unspecified, unspecified trimester: Secondary | ICD-10-CM | POA: Diagnosis not present

## 2024-01-22 DIAGNOSIS — Z8759 Personal history of other complications of pregnancy, childbirth and the puerperium: Secondary | ICD-10-CM

## 2024-01-22 DIAGNOSIS — O0943 Supervision of pregnancy with grand multiparity, third trimester: Secondary | ICD-10-CM | POA: Diagnosis not present

## 2024-01-22 DIAGNOSIS — Z3A27 27 weeks gestation of pregnancy: Secondary | ICD-10-CM | POA: Diagnosis not present

## 2024-01-22 DIAGNOSIS — O99013 Anemia complicating pregnancy, third trimester: Secondary | ICD-10-CM | POA: Diagnosis not present

## 2024-01-22 DIAGNOSIS — O0933 Supervision of pregnancy with insufficient antenatal care, third trimester: Secondary | ICD-10-CM | POA: Diagnosis not present

## 2024-01-22 DIAGNOSIS — Z3A31 31 weeks gestation of pregnancy: Secondary | ICD-10-CM

## 2024-01-22 DIAGNOSIS — D573 Sickle-cell trait: Secondary | ICD-10-CM

## 2024-01-22 NOTE — Progress Notes (Signed)
 Maternal-Fetal Medicine Consultation Name: Isis Costanza MRN: [id number]  G9 W2956 at 32w 4d gestation.  Patient is here for completion of fetal anatomy. Her high-risk problems include: -Advanced maternal age.  On cell-free fetal DNA screening, the risks of fetal aneuploidies are not increased. - Previous 5 cesarean deliveries.  Patient had 5 term cesarean deliveries. - Grand multigravida. - Postpartum hypertension in her most recent pregnancy.  Patient takes low-dose aspirin  prophylaxis. -Previous fetal growth restriction (2021).  Her daughter weighed 6 pounds and 1 ounce at birth. - Severe anemia. Hb 8.2 g/dL.  Patient will be having iron transfusion next week. - Sickle cell trait.  Patient reports all her children are from the same partner and he does not have sickle cell trait.  Prenatal care: Her pregnancy is dated by 26-week ultrasound performed at the maternity admissions unit last month.  Patient does not have gestational diabetes.  Blood pressure today at our office is 116/65 mmHg.  Ultrasound Fetal growth is appropriate for gestational age.  Amniotic fluid is normal good fetal activity seen.  Fetal anatomical survey was completed and appears normal. Placenta is anterior and there is no evidence of previa or placenta accreta spectrum.  Our concerns include: Previous 5 cesarean deliveries I reassured the patient of normal placental location and absence of evidence of placenta accreta spectrum.  I counseled the patient that repeat cesarean deliveries increase the risks of placenta previa and/or placenta accreta spectrum.  Patient is planning to have another child. Long-term complications include small bowel obstructions.  Anemia in pregnancy I counseled the patient that if her anemia is not corrected, cesarean delivery will lead to significant blood loss and the patient may require blood transfusion.  I encouraged her to take iron transfusion.  Iron supplements will take a few weeks for  the hemoglobin to build up.  Postpartum hypertension I counseled the patient that gestational hypertension/preeclampsia recurs in subsequent pregnancies and up to 50% of cases.  I discussed the benefit of low-dose aspirin  prophylaxis delays or prevents preeclampsia, and encouraged her to continue aspirin .  Previous fetal growth restriction The recurrence rate of fetal growth restriction is about 20%.  Will repeat fetal growth assessment in 4 weeks.  Grand multigravida Patient did not give history of postpartum hemorrhage.  Grand multiparity and repeat cesarean deliveries are associated with increased risk of postpartum hemorrhage.  Recommendations - An appointment was made for her to return in 4 weeks for fetal growth assessment. - Delivery at [redacted] weeks gestation. - Blood should be available at short notice during cesarean delivery if anemia persists.  Consultation including face-to-face (more than 50%) counseling 45 minutes.

## 2024-01-23 NOTE — Progress Notes (Unsigned)
   PRENATAL VISIT NOTE  Subjective:  Linda Duffy is a 38 y.o. [id number] at [redacted]w[redacted]d being seen today for ongoing prenatal care.  She is currently monitored for the following issues for this {Blank single:19197::"high-risk","low-risk"} pregnancy and has Late prenatal care affecting pregnancy in second trimester; Anemia affecting pregnancy in second trimester; Sickle cell trait in mother affecting pregnancy (HCC); Advanced maternal age in multigravida; History of postpartum hypertension; Hx of cesarean section; No transfusions per religious beliefs; History of group B Streptococcus (GBS) infection; Postoperative anemia; Supervision of high risk pregnancy, antepartum; and Iron deficiency anemia during pregnancy on their problem list.  Patient reports {[id number]}.  Contractions: Irritability. Vag. Bleeding: None.  Movement: Present. Denies leaking of fluid.   The following portions of the patient's history were reviewed and updated as appropriate: allergies, current medications, past family history, past medical history, past social history, past surgical history and problem list.   Objective:    Vitals:   01/26/24 1427  BP: 112/67  Pulse: (!) 106  Weight: 163 lb 1.6 oz (74 kg)    Fetal Status:  Fetal Heart Rate (bpm): 140   Movement: Present    General: Alert, oriented and cooperative. Patient is in no acute distress.  Skin: Skin is warm and dry. No rash noted.   Cardiovascular: Normal heart rate noted  Respiratory: Normal respiratory effort, no problems with respiration noted  Abdomen: Soft, gravid, appropriate for gestational age.  Pain/Pressure: Present     Pelvic: Cervical exam deferred        Extremities: Normal range of motion.  Edema: None  Mental Status: Normal mood and affect. Normal behavior. Normal judgment and thought content.   Assessment and Plan:  Pregnancy: J4N8295 at [redacted]w[redacted]d 1. Supervision of high risk pregnancy in third trimester (Primary) ***  2. [redacted] weeks gestation of  pregnancy ***  3. Multigravida of advanced maternal age in third trimester ***  4. Hx of cesarean section ***  5. History of postpartum hypertension ***  {Blank single:19197::"Term","Preterm"} labor symptoms and general obstetric precautions including but not limited to vaginal bleeding, contractions, leaking of fluid and fetal movement were reviewed in detail with the patient. Please refer to After Visit Summary for other counseling recommendations.   Return in about 3 weeks (around 02/16/2024) for HROB with GBS.  Future Appointments  Date Time Provider Department Center  01/27/2024  1:00 PM [facility] None  01/29/2024  1:00 PM [facility] 8 [facility] None  02/01/2024  1:00 PM [facility] 4 [facility] None  02/03/2024  1:00 PM [facility] 5 [facility] None  02/09/2024  2:35 PM Zelma Hidden, FNP [facility]  02/19/2024  1:00 PM [facility] PROVIDER 1 [facility]  02/19/2024  1:30 PM [facility] US6 [facility]  02/23/2024  2:15 PM Delfina Feller [facility]  03/07/2024  1:55 PM Ebony Goldstein, MD [facility]  03/14/2024  1:15 PM Noreene Bearded, PA [facility]  03/21/2024  2:35 PM Cresenzo, Mardee Shackle, MD [facility]    Salomon Cree, CNM

## 2024-01-25 ENCOUNTER — Ambulatory Visit (INDEPENDENT_AMBULATORY_CARE_PROVIDER_SITE_OTHER)

## 2024-01-25 VITALS — BP 102/65 | HR 80 | Temp 98.7°F | Resp 16 | Ht 67.0 in | Wt 165.2 lb

## 2024-01-25 DIAGNOSIS — Z3A33 33 weeks gestation of pregnancy: Secondary | ICD-10-CM

## 2024-01-25 DIAGNOSIS — O99012 Anemia complicating pregnancy, second trimester: Secondary | ICD-10-CM

## 2024-01-25 DIAGNOSIS — D508 Other iron deficiency anemias: Secondary | ICD-10-CM | POA: Diagnosis not present

## 2024-01-25 DIAGNOSIS — O99013 Anemia complicating pregnancy, third trimester: Secondary | ICD-10-CM | POA: Diagnosis not present

## 2024-01-25 DIAGNOSIS — D509 Iron deficiency anemia, unspecified: Secondary | ICD-10-CM

## 2024-01-25 MED ORDER — IRON SUCROSE 20 MG/ML IV SOLN
200.0000 mg | Freq: Once | INTRAVENOUS | Status: AC
Start: 2024-01-25 — End: 2024-01-25
  Administered 2024-01-25: 200 mg via INTRAVENOUS
  Filled 2024-01-25: qty 10

## 2024-01-25 NOTE — Progress Notes (Signed)
 Diagnosis: Iron Deficiency Anemia  Provider:  Chilton Greathouse MD  Procedure: IV Push  IV Type: Peripheral, IV Location: R Antecubital  Venofer (Iron Sucrose), Dose: 200 mg  Post Infusion IV Care: Observation period completed and Peripheral IV Discontinued  Discharge: Condition: Good, Destination: Home . AVS Declined  Performed by:  Rico Ala, LPN

## 2024-01-26 ENCOUNTER — Other Ambulatory Visit: Payer: Self-pay

## 2024-01-26 ENCOUNTER — Ambulatory Visit (INDEPENDENT_AMBULATORY_CARE_PROVIDER_SITE_OTHER): Admitting: Certified Nurse Midwife

## 2024-01-26 VITALS — BP 112/67 | HR 106 | Wt 163.1 lb

## 2024-01-26 DIAGNOSIS — O0993 Supervision of high risk pregnancy, unspecified, third trimester: Secondary | ICD-10-CM | POA: Diagnosis not present

## 2024-01-26 DIAGNOSIS — O34219 Maternal care for unspecified type scar from previous cesarean delivery: Secondary | ICD-10-CM | POA: Diagnosis not present

## 2024-01-26 DIAGNOSIS — Z8679 Personal history of other diseases of the circulatory system: Secondary | ICD-10-CM

## 2024-01-26 DIAGNOSIS — Z3A33 33 weeks gestation of pregnancy: Secondary | ICD-10-CM

## 2024-01-26 DIAGNOSIS — O99013 Anemia complicating pregnancy, third trimester: Secondary | ICD-10-CM

## 2024-01-26 DIAGNOSIS — O09523 Supervision of elderly multigravida, third trimester: Secondary | ICD-10-CM | POA: Diagnosis not present

## 2024-01-26 DIAGNOSIS — D649 Anemia, unspecified: Secondary | ICD-10-CM

## 2024-01-26 DIAGNOSIS — Z98891 History of uterine scar from previous surgery: Secondary | ICD-10-CM

## 2024-01-26 MED ORDER — ACCRUFER 30 MG PO CAPS: 1.0000 | ORAL_CAPSULE | Freq: Every day | ORAL | 5 refills | Status: AC

## 2024-01-27 ENCOUNTER — Ambulatory Visit

## 2024-01-27 VITALS — BP 110/69 | HR 84 | Temp 99.2°F | Resp 18 | Ht 67.0 in | Wt 164.0 lb

## 2024-01-27 DIAGNOSIS — O99013 Anemia complicating pregnancy, third trimester: Secondary | ICD-10-CM | POA: Diagnosis not present

## 2024-01-27 DIAGNOSIS — D508 Other iron deficiency anemias: Secondary | ICD-10-CM

## 2024-01-27 DIAGNOSIS — O99012 Anemia complicating pregnancy, second trimester: Secondary | ICD-10-CM

## 2024-01-27 DIAGNOSIS — Z3A33 33 weeks gestation of pregnancy: Secondary | ICD-10-CM | POA: Diagnosis not present

## 2024-01-27 DIAGNOSIS — D509 Iron deficiency anemia, unspecified: Secondary | ICD-10-CM

## 2024-01-27 MED ORDER — IRON SUCROSE 20 MG/ML IV SOLN
200.0000 mg | Freq: Once | INTRAVENOUS | Status: AC
  Administered 2024-01-27: 200 mg via INTRAVENOUS
  Filled 2024-01-27: qty 10

## 2024-01-27 NOTE — Progress Notes (Signed)
 Diagnosis: Iron Deficiency Anemia  Provider:  Chilton Greathouse MD  Procedure: IV Push  IV Type: Peripheral, IV Location: R Antecubital  Venofer (Iron Sucrose), Dose: 200 mg  Post Infusion IV Care: Observation period completed and Peripheral IV Discontinued  Discharge: Condition: Good, Destination: Home . AVS Declined  Performed by:  Rico Ala, LPN

## 2024-01-29 ENCOUNTER — Ambulatory Visit

## 2024-01-29 VITALS — BP 107/70 | HR 79 | Temp 99.0°F | Resp 20 | Ht 67.0 in | Wt 166.0 lb

## 2024-01-29 DIAGNOSIS — O99013 Anemia complicating pregnancy, third trimester: Secondary | ICD-10-CM | POA: Diagnosis not present

## 2024-01-29 DIAGNOSIS — D508 Other iron deficiency anemias: Secondary | ICD-10-CM

## 2024-01-29 DIAGNOSIS — O99012 Anemia complicating pregnancy, second trimester: Secondary | ICD-10-CM

## 2024-01-29 DIAGNOSIS — Z3A33 33 weeks gestation of pregnancy: Secondary | ICD-10-CM | POA: Diagnosis not present

## 2024-01-29 DIAGNOSIS — D509 Iron deficiency anemia, unspecified: Secondary | ICD-10-CM

## 2024-01-29 MED ORDER — IRON SUCROSE 20 MG/ML IV SOLN
200.0000 mg | Freq: Once | INTRAVENOUS | Status: AC
  Administered 2024-01-29: 200 mg via INTRAVENOUS
  Filled 2024-01-29: qty 10

## 2024-01-29 NOTE — Progress Notes (Signed)
 Diagnosis: Iron Deficiency Anemia  Provider:  Chilton Greathouse MD  Procedure: IV Push  IV Type: Peripheral, IV Location: L Antecubital  Venofer (Iron Sucrose), Dose: 200 mg  Post Infusion IV Care: Observation period completed and Peripheral IV Discontinued  Discharge: Condition: Good, Destination: Home . AVS Declined  Performed by:  Loney Hering, LPN

## 2024-02-01 ENCOUNTER — Ambulatory Visit

## 2024-02-01 VITALS — BP 120/75 | HR 90 | Temp 98.2°F | Resp 16 | Ht 67.0 in | Wt 165.4 lb

## 2024-02-01 DIAGNOSIS — O99013 Anemia complicating pregnancy, third trimester: Secondary | ICD-10-CM

## 2024-02-01 DIAGNOSIS — D508 Other iron deficiency anemias: Secondary | ICD-10-CM | POA: Diagnosis not present

## 2024-02-01 DIAGNOSIS — Z3A34 34 weeks gestation of pregnancy: Secondary | ICD-10-CM

## 2024-02-01 DIAGNOSIS — O99012 Anemia complicating pregnancy, second trimester: Secondary | ICD-10-CM

## 2024-02-01 DIAGNOSIS — D509 Iron deficiency anemia, unspecified: Secondary | ICD-10-CM

## 2024-02-01 MED ORDER — IRON SUCROSE 20 MG/ML IV SOLN
200.0000 mg | Freq: Once | INTRAVENOUS | Status: AC
  Administered 2024-02-01: 200 mg via INTRAVENOUS
  Filled 2024-02-01: qty 10

## 2024-02-01 NOTE — Progress Notes (Signed)
 Diagnosis: Iron  Deficiency Anemia  Provider:  Praveen Mannam MD  Procedure: IV Push  IV Type: Peripheral, IV Location: L Forearm  Venofer  (Iron  Sucrose), Dose: 200 mg  Post Infusion IV Care: Observation period completed  Discharge: Condition: Good, Destination: Home . AVS Declined  Performed by:  Rachelle Bue, RN

## 2024-02-03 ENCOUNTER — Ambulatory Visit

## 2024-02-03 VITALS — BP 102/69 | HR 83 | Temp 98.8°F | Resp 18 | Ht 67.0 in | Wt 164.2 lb

## 2024-02-03 DIAGNOSIS — Z3A34 34 weeks gestation of pregnancy: Secondary | ICD-10-CM | POA: Diagnosis not present

## 2024-02-03 DIAGNOSIS — O99013 Anemia complicating pregnancy, third trimester: Secondary | ICD-10-CM

## 2024-02-03 DIAGNOSIS — O99012 Anemia complicating pregnancy, second trimester: Secondary | ICD-10-CM

## 2024-02-03 DIAGNOSIS — D508 Other iron deficiency anemias: Secondary | ICD-10-CM | POA: Diagnosis not present

## 2024-02-03 DIAGNOSIS — D509 Iron deficiency anemia, unspecified: Secondary | ICD-10-CM

## 2024-02-03 MED ORDER — IRON SUCROSE 20 MG/ML IV SOLN
200.0000 mg | Freq: Once | INTRAVENOUS | Status: AC
  Administered 2024-02-03: 200 mg via INTRAVENOUS
  Filled 2024-02-03: qty 10

## 2024-02-03 NOTE — Progress Notes (Signed)
 Diagnosis: Iron Deficiency Anemia  Provider:  Chilton Greathouse MD  Procedure: IV Push  IV Type: Peripheral, IV Location: R Antecubital  Venofer (Iron Sucrose), Dose: 200 mg  Post Infusion IV Care: Patient declined observation and Peripheral IV Discontinued  Discharge: Condition: Good, Destination: Home . AVS Declined  Performed by:  Rico Ala, LPN

## 2024-02-09 ENCOUNTER — Other Ambulatory Visit: Payer: Self-pay

## 2024-02-09 ENCOUNTER — Ambulatory Visit: Admitting: Obstetrics and Gynecology

## 2024-02-09 VITALS — BP 121/58 | HR 100 | Wt 167.0 lb

## 2024-02-09 DIAGNOSIS — O09893 Supervision of other high risk pregnancies, third trimester: Secondary | ICD-10-CM | POA: Diagnosis not present

## 2024-02-09 DIAGNOSIS — O0993 Supervision of high risk pregnancy, unspecified, third trimester: Secondary | ICD-10-CM

## 2024-02-09 DIAGNOSIS — D649 Anemia, unspecified: Secondary | ICD-10-CM

## 2024-02-09 DIAGNOSIS — O09523 Supervision of elderly multigravida, third trimester: Secondary | ICD-10-CM

## 2024-02-09 DIAGNOSIS — O99013 Anemia complicating pregnancy, third trimester: Secondary | ICD-10-CM

## 2024-02-09 DIAGNOSIS — O34219 Maternal care for unspecified type scar from previous cesarean delivery: Secondary | ICD-10-CM

## 2024-02-09 DIAGNOSIS — O0990 Supervision of high risk pregnancy, unspecified, unspecified trimester: Secondary | ICD-10-CM

## 2024-02-09 DIAGNOSIS — Z3A35 35 weeks gestation of pregnancy: Secondary | ICD-10-CM | POA: Diagnosis not present

## 2024-02-09 DIAGNOSIS — Z8679 Personal history of other diseases of the circulatory system: Secondary | ICD-10-CM

## 2024-02-09 DIAGNOSIS — Z98891 History of uterine scar from previous surgery: Secondary | ICD-10-CM

## 2024-02-09 NOTE — Progress Notes (Signed)
   PRENATAL VISIT NOTE  Subjective:  Linda Duffy is a 38 y.o. [id number] at [redacted]w[redacted]d being seen today for ongoing prenatal care.  She is currently monitored for the following issues for this high-risk pregnancy and has Late prenatal care affecting pregnancy in second trimester; Anemia affecting pregnancy in second trimester; Sickle cell trait in mother affecting pregnancy (HCC); Advanced maternal age in multigravida; History of postpartum hypertension; Hx of cesarean section; No transfusions per religious beliefs; History of group B Streptococcus (GBS) infection; Postoperative anemia; Supervision of high risk pregnancy, antepartum; and Iron  deficiency anemia during pregnancy on their problem list.  Patient reports no complaints.  Contractions: Irritability. Vag. Bleeding: None.  Movement: Present. Denies leaking of fluid.   The following portions of the patient's history were reviewed and updated as appropriate: allergies, current medications, past family history, past medical history, past social history, past surgical history and problem list.   Objective:    Vitals:   02/09/24 1459  BP: (!) 121/58  Pulse: 100  Weight: 167 lb (75.8 kg)    Fetal Status:  Fetal Heart Rate (bpm): 156   Movement: Present    General: Alert, oriented and cooperative. Patient is in no acute distress.  Skin: Skin is warm and dry. No rash noted.   Cardiovascular: Normal heart rate noted  Respiratory: Normal respiratory effort, no problems with respiration noted  Abdomen: Soft, gravid, appropriate for gestational age.  Pain/Pressure: Present (normal per patient)     Pelvic: Cervical exam deferred        Extremities: Normal range of motion.  Edema: None  Mental Status: Normal mood and affect. Normal behavior. Normal judgment and thought content.   Assessment and Plan:  Pregnancy: [id number] at [redacted]w[redacted]d 1. Supervision of high risk pregnancy in third trimester (Primary) BP and FHR normal Doing well, feeling regular  movement    2. [redacted] weeks gestation of pregnancy Swabs collected today   - Cervicovaginal ancillary only - Strep Gp B NAA  3. Multigravida of advanced maternal age in third trimester Following MFM  4. Hx of cesarean section RCS scheduled for 03/07/24 w/ [facility]  5. History of postpartum hypertension Normotensive, Continue ASA  6. Anemia affecting pregnancy in third trimester S/p IV iron  x5 last infusion 6/11, repeat next visit   Preterm labor symptoms and general obstetric precautions including but not limited to vaginal bleeding, contractions, leaking of fluid and fetal movement were reviewed in detail with the patient. Please refer to After Visit Summary for other counseling recommendations.   Return in one week for routine prenatal   Future Appointments  Date Time Provider Department Center  02/19/2024  1:00 PM [facility] PROVIDER 1 [facility]  02/19/2024  1:30 PM [facility] US6 [facility]  02/23/2024  2:15 PM Curlie Doughty [facility]  03/07/2024  1:55 PM Ebony Goldstein, MD [facility]  03/14/2024  1:15 PM Noreene Bearded, PA [facility]  03/21/2024  2:35 PM Cresenzo, Mardee Shackle, MD [facility]    Susi Eric, FNP

## 2024-02-11 ENCOUNTER — Ambulatory Visit: Payer: Self-pay | Admitting: Obstetrics and Gynecology

## 2024-02-11 LAB — STREP GP B NAA: Strep Gp B NAA: NEGATIVE

## 2024-02-17 ENCOUNTER — Other Ambulatory Visit: Payer: Self-pay | Admitting: Family Medicine

## 2024-02-17 DIAGNOSIS — Z98891 History of uterine scar from previous surgery: Secondary | ICD-10-CM

## 2024-02-19 ENCOUNTER — Ambulatory Visit (HOSPITAL_BASED_OUTPATIENT_CLINIC_OR_DEPARTMENT_OTHER)

## 2024-02-19 ENCOUNTER — Ambulatory Visit: Attending: Obstetrics and Gynecology | Admitting: Obstetrics

## 2024-02-19 VITALS — BP 111/60 | HR 92

## 2024-02-19 DIAGNOSIS — Z3A36 36 weeks gestation of pregnancy: Secondary | ICD-10-CM | POA: Diagnosis not present

## 2024-02-19 DIAGNOSIS — O0932 Supervision of pregnancy with insufficient antenatal care, second trimester: Secondary | ICD-10-CM | POA: Insufficient documentation

## 2024-02-19 DIAGNOSIS — Z98891 History of uterine scar from previous surgery: Secondary | ICD-10-CM | POA: Diagnosis not present

## 2024-02-19 DIAGNOSIS — Z8759 Personal history of other complications of pregnancy, childbirth and the puerperium: Secondary | ICD-10-CM | POA: Insufficient documentation

## 2024-02-19 DIAGNOSIS — O0990 Supervision of high risk pregnancy, unspecified, unspecified trimester: Secondary | ICD-10-CM

## 2024-02-19 DIAGNOSIS — O09523 Supervision of elderly multigravida, third trimester: Secondary | ICD-10-CM | POA: Diagnosis not present

## 2024-02-19 DIAGNOSIS — O09293 Supervision of pregnancy with other poor reproductive or obstetric history, third trimester: Secondary | ICD-10-CM

## 2024-02-19 NOTE — Progress Notes (Signed)
 MFM Consult Note  Linda Duffy is currently at 36 weeks and 4 days.  She has been followed due to advanced maternal age (38 years old) and grand multiparity.  She has a history of 5 prior cesarean deliveries.  She denies any problems since her last exam.  On today's exam, the overall EFW of 5 pounds 15 ounces measures at the 26 percentile for her gestational age.    There was normal amniotic fluid noted with a total AFI of 11.23 cm.    The fetus was in the vertex presentation.    A normal-appearing anterior placenta was noted. There were no signs of placenta accreta or placenta previa noted on today's exam.    The patient is already scheduled for repeat cesarean delivery on March 07, 2024.    No further exams were scheduled in our office.    The patient stated that all of her questions were answered today.  A total of 10 minutes was spent counseling and coordinating the care for this patient.  Greater than 50% of the time was spent in direct face-to-face contact.

## 2024-02-22 NOTE — Progress Notes (Unsigned)
   PRENATAL VISIT NOTE  Subjective:  Linda Duffy is a 38 y.o. [id number] at [redacted]w[redacted]d being seen today for ongoing prenatal care.  She is currently monitored for the following issues for this {Blank single:19197::high-risk,low-risk} pregnancy and has Late prenatal care affecting pregnancy in second trimester; Anemia affecting pregnancy in second trimester; Sickle cell trait in mother affecting pregnancy (HCC); Advanced maternal age in multigravida; History of postpartum hypertension; Hx of cesarean section; No transfusions per religious beliefs; History of group B Streptococcus (GBS) infection; Postoperative anemia; Supervision of high risk pregnancy, antepartum; and Iron  deficiency anemia during pregnancy on their problem list.  Patient reports {sx:14538}.   .  .   . Denies leaking of fluid.   The following portions of the patient's history were reviewed and updated as appropriate: allergies, current medications, past family history, past medical history, past social history, past surgical history and problem list.   Objective:    There were no vitals filed for this visit.  Fetal Status:           General: Alert, oriented and cooperative. Patient is in no acute distress.  Skin: Skin is warm and dry. No rash noted.   Cardiovascular: Normal heart rate noted  Respiratory: Normal respiratory effort, no problems with respiration noted  Abdomen: Soft, gravid, appropriate for gestational age.        Pelvic: {Blank single:19197::Cervical exam performed in the presence of a chaperone,Cervical exam deferred}        Extremities: Normal range of motion.     Mental Status: Normal mood and affect. Normal behavior. Normal judgment and thought content.   Assessment and Plan:  Pregnancy: [id number] at [redacted]w[redacted]d 1. Supervision of high risk pregnancy in third trimester (Primary) *** Blood clot?   2. [redacted] weeks gestation of pregnancy ***  3. Hx of cesarean section ***  4. Multigravida of advanced  maternal age in third trimester ***  5. History of postpartum hypertension ***  6. Anemia affecting pregnancy in third trimester ***  {Blank single:19197::Term,Preterm} labor symptoms and general obstetric precautions including but not limited to vaginal bleeding, contractions, leaking of fluid and fetal movement were reviewed in detail with the patient. Please refer to After Visit Summary for other counseling recommendations.   No follow-ups on file.  Future Appointments  Date Time Provider Department Center  02/23/2024  2:15 PM Regino Camie DELENA EDDY [facility]  03/07/2024  1:55 PM Jhonny Augustin BROCKS, MD [facility]  03/14/2024  1:15 PM Wallace Joesph DELENA, [place] [facility]  03/21/2024  2:35 PM Ilean, Norleen GAILS, MD [facility]    Camie DELENA Regino, CNM

## 2024-02-23 ENCOUNTER — Ambulatory Visit: Payer: Self-pay | Admitting: Certified Nurse Midwife

## 2024-02-23 ENCOUNTER — Other Ambulatory Visit: Payer: Self-pay

## 2024-02-23 VITALS — BP 115/69 | HR 108 | Wt 172.0 lb

## 2024-02-23 DIAGNOSIS — O9902 Anemia complicating childbirth: Secondary | ICD-10-CM | POA: Diagnosis not present

## 2024-02-23 DIAGNOSIS — O34211 Maternal care for low transverse scar from previous cesarean delivery: Secondary | ICD-10-CM

## 2024-02-23 DIAGNOSIS — Z8759 Personal history of other complications of pregnancy, childbirth and the puerperium: Secondary | ICD-10-CM | POA: Diagnosis not present

## 2024-02-23 DIAGNOSIS — O34219 Maternal care for unspecified type scar from previous cesarean delivery: Secondary | ICD-10-CM | POA: Diagnosis not present

## 2024-02-23 DIAGNOSIS — Z3A37 37 weeks gestation of pregnancy: Secondary | ICD-10-CM

## 2024-02-23 DIAGNOSIS — Z8679 Personal history of other diseases of the circulatory system: Secondary | ICD-10-CM

## 2024-02-23 DIAGNOSIS — O99013 Anemia complicating pregnancy, third trimester: Secondary | ICD-10-CM | POA: Diagnosis not present

## 2024-02-23 DIAGNOSIS — O09523 Supervision of elderly multigravida, third trimester: Secondary | ICD-10-CM

## 2024-02-23 DIAGNOSIS — D649 Anemia, unspecified: Secondary | ICD-10-CM

## 2024-02-23 DIAGNOSIS — O0993 Supervision of high risk pregnancy, unspecified, third trimester: Secondary | ICD-10-CM

## 2024-02-23 DIAGNOSIS — Z98891 History of uterine scar from previous surgery: Secondary | ICD-10-CM

## 2024-02-24 ENCOUNTER — Encounter (HOSPITAL_COMMUNITY): Payer: Self-pay

## 2024-02-24 NOTE — Patient Instructions (Signed)
 Linda Duffy  02/24/2024   Your procedure is scheduled on:  03/07/2024  Arrive at 1015 at Entrance C on [employer] at [facility]  and [employer]. You are invited to use the FREE valet parking or use the Visitor's parking deck.  Pick up the phone at the desk and dial  [phone].  Call this number if you have problems the morning of surgery: [phone]  Remember:   Do not eat food:(After Midnight) Desps de medianoche.  You may drink clear liquids until  __0815___.  Clear liquids means a liquid you can see thru.  It can have color such as Cola or Kool aid.  Tea is OK and coffee as long as no milk or creamer of any kind.  Take these medicines the morning of surgery with A SIP OF WATER :  none   Do not wear jewelry, make-up or nail polish.  Do not wear lotions, powders, or perfumes. Do not wear deodorant.  Do not shave 48 hours prior to surgery.  Do not bring valuables to the hospital.  [facility] is not   responsible for any belongings or valuables brought to the hospital.  Contacts, dentures or bridgework may not be worn into surgery.  Leave suitcase in the car. After surgery it may be brought to your room.  For patients admitted to the hospital, checkout time is 11:00 AM the day of              discharge.      Please read over the following fact sheets that you were given:     Preparing for Surgery

## 2024-03-04 ENCOUNTER — Encounter (HOSPITAL_COMMUNITY): Admission: RE | Admit: 2024-03-04 | Source: Ambulatory Visit

## 2024-03-04 ENCOUNTER — Encounter (HOSPITAL_COMMUNITY)
Admission: RE | Admit: 2024-03-04 | Discharge: 2024-03-04 | Disposition: A | Discharge status: Home / Self Care | Source: Ambulatory Visit | Attending: Family Medicine | Admitting: Family Medicine

## 2024-03-04 ENCOUNTER — Ambulatory Visit (HOSPITAL_COMMUNITY)
Admission: RE | Admit: 2024-03-04 | Discharge: 2024-03-04 | Disposition: A | Discharge status: Home / Self Care | Source: Ambulatory Visit | Attending: Family Medicine | Admitting: Family Medicine

## 2024-03-04 DIAGNOSIS — Z01812 Encounter for preprocedural laboratory examination: Secondary | ICD-10-CM | POA: Insufficient documentation

## 2024-03-04 DIAGNOSIS — Z98891 History of uterine scar from previous surgery: Secondary | ICD-10-CM | POA: Diagnosis not present

## 2024-03-04 HISTORY — DX: Personal history of other venous thrombosis and embolism: Z86.718

## 2024-03-04 LAB — CBC
HCT: 26.1 % — ABNORMAL LOW (ref 36.0–46.0)
Hemoglobin: 8.8 g/dL — ABNORMAL LOW (ref 12.0–15.0)
MCH: 32.1 pg (ref 26.0–34.0)
MCHC: 33.7 g/dL (ref 30.0–36.0)
MCV: 95.3 fL (ref 80.0–100.0)
Platelets: 243 K/uL (ref 150–400)
RBC: 2.74 MIL/uL — ABNORMAL LOW (ref 3.87–5.11)
RDW: 15.2 % (ref 11.5–15.5)
WBC: 7.8 K/uL (ref 4.0–10.5)
nRBC: 0 % (ref 0.0–0.2)

## 2024-03-05 LAB — RPR: RPR Ser Ql: NONREACTIVE

## 2024-03-06 ENCOUNTER — Encounter (HOSPITAL_COMMUNITY): Payer: Self-pay | Admitting: Family Medicine

## 2024-03-06 NOTE — Anesthesia Preprocedure Evaluation (Addendum)
 Anesthesia Evaluation  Patient identified by MRN, date of birth, ID band Patient awake    Reviewed: Allergy & Precautions, NPO status , Patient's Chart, lab work & pertinent test results  Airway Mallampati: I  TM Distance: >3 FB     Dental no notable dental hx. (+) Teeth Intact, Dental Advisory Given   Pulmonary former smoker   Pulmonary exam normal breath sounds clear to auscultation       Cardiovascular negative cardio ROS Normal cardiovascular exam Rhythm:Regular Rate:Normal     Neuro/Psych  PSYCHIATRIC DISORDERS  Depression    negative neurological ROS     GI/Hepatic   Endo/Other  negative endocrine ROS    Renal/GU Renal disease  negative genitourinary   Musculoskeletal negative musculoskeletal ROS (+)    Abdominal   Peds  Hematology  (+) Blood dyscrasia, Sickle cell trait and anemia , REFUSES BLOOD PRODUCTSPatient states she will accept blood tranfusion in case of emergency   Anesthesia Other Findings   Reproductive/Obstetrics (+) Pregnancy AMA Grand multiparity Previous C/Section x 5                              Anesthesia Physical Anesthesia Plan  ASA: 2  Anesthesia Plan: Combined Spinal and Epidural   Post-op Pain Management: Minimal or no pain anticipated   Induction: Intravenous  PONV Risk Score and Plan: 4 or greater and Treatment may vary due to age or medical condition  Airway Management Planned: Natural Airway  Additional Equipment: None and Fetal Monitoring  Intra-op Plan:   Post-operative Plan:   Informed Consent: I have reviewed the patients History and Physical, chart, labs and discussed the procedure including the risks, benefits and alternatives for the proposed anesthesia with the patient or authorized representative who has indicated his/her understanding and acceptance.     Dental advisory given  Plan Discussed with: CRNA, Anesthesiologist and  Surgeon  Anesthesia Plan Comments:          Anesthesia Quick Evaluation

## 2024-03-07 ENCOUNTER — Inpatient Hospital Stay (HOSPITAL_COMMUNITY): Payer: Self-pay | Admitting: Anesthesiology

## 2024-03-07 ENCOUNTER — Inpatient Hospital Stay (HOSPITAL_COMMUNITY)
Admission: AD | Admit: 2024-03-07 | Discharge: 2024-03-09 | DRG: 787 | Disposition: A | Discharge status: Home / Self Care | Attending: Family Medicine | Admitting: Family Medicine

## 2024-03-07 ENCOUNTER — Encounter (HOSPITAL_COMMUNITY): Payer: Self-pay | Admitting: Family Medicine

## 2024-03-07 ENCOUNTER — Encounter (HOSPITAL_COMMUNITY)
Admission: AD | Disposition: A | Discharge status: Home / Self Care | Payer: Self-pay | Source: Home / Self Care | Attending: Family Medicine

## 2024-03-07 ENCOUNTER — Other Ambulatory Visit: Payer: Self-pay

## 2024-03-07 ENCOUNTER — Encounter: Payer: Self-pay | Admitting: Family Medicine

## 2024-03-07 DIAGNOSIS — O0933 Supervision of pregnancy with insufficient antenatal care, third trimester: Secondary | ICD-10-CM | POA: Diagnosis not present

## 2024-03-07 DIAGNOSIS — O0932 Supervision of pregnancy with insufficient antenatal care, second trimester: Secondary | ICD-10-CM

## 2024-03-07 DIAGNOSIS — D509 Iron deficiency anemia, unspecified: Secondary | ICD-10-CM

## 2024-03-07 DIAGNOSIS — O34211 Maternal care for low transverse scar from previous cesarean delivery: Secondary | ICD-10-CM | POA: Diagnosis not present

## 2024-03-07 DIAGNOSIS — Z3A39 39 weeks gestation of pregnancy: Secondary | ICD-10-CM

## 2024-03-07 DIAGNOSIS — Z8249 Family history of ischemic heart disease and other diseases of the circulatory system: Secondary | ICD-10-CM | POA: Diagnosis not present

## 2024-03-07 DIAGNOSIS — Z3490 Encounter for supervision of normal pregnancy, unspecified, unspecified trimester: Secondary | ICD-10-CM

## 2024-03-07 DIAGNOSIS — Z833 Family history of diabetes mellitus: Secondary | ICD-10-CM | POA: Diagnosis not present

## 2024-03-07 DIAGNOSIS — O09529 Supervision of elderly multigravida, unspecified trimester: Secondary | ICD-10-CM

## 2024-03-07 DIAGNOSIS — Z87891 Personal history of nicotine dependence: Secondary | ICD-10-CM

## 2024-03-07 DIAGNOSIS — D573 Sickle-cell trait: Secondary | ICD-10-CM | POA: Diagnosis not present

## 2024-03-07 DIAGNOSIS — O0990 Supervision of high risk pregnancy, unspecified, unspecified trimester: Secondary | ICD-10-CM

## 2024-03-07 DIAGNOSIS — D62 Acute posthemorrhagic anemia: Secondary | ICD-10-CM | POA: Diagnosis not present

## 2024-03-07 DIAGNOSIS — O9081 Anemia of the puerperium: Secondary | ICD-10-CM | POA: Diagnosis not present

## 2024-03-07 DIAGNOSIS — Z049 Encounter for examination and observation for unspecified reason: Secondary | ICD-10-CM

## 2024-03-07 DIAGNOSIS — O99019 Anemia complicating pregnancy, unspecified trimester: Secondary | ICD-10-CM | POA: Diagnosis present

## 2024-03-07 DIAGNOSIS — Z8679 Personal history of other diseases of the circulatory system: Secondary | ICD-10-CM

## 2024-03-07 DIAGNOSIS — O99013 Anemia complicating pregnancy, third trimester: Principal | ICD-10-CM

## 2024-03-07 DIAGNOSIS — O99820 Streptococcus B carrier state complicating pregnancy: Secondary | ICD-10-CM | POA: Diagnosis not present

## 2024-03-07 DIAGNOSIS — Z98891 History of uterine scar from previous surgery: Principal | ICD-10-CM

## 2024-03-07 DIAGNOSIS — Z531 Procedure and treatment not carried out because of patient's decision for reasons of belief and group pressure: Secondary | ICD-10-CM

## 2024-03-07 LAB — PREPARE RBC (CROSSMATCH)

## 2024-03-07 SURGERY — Surgical Case
Anesthesia: Spinal

## 2024-03-07 MED ORDER — MENTHOL 3 MG MT LOZG: 1.0000 | LOZENGE | OROMUCOSAL | Status: DC | PRN

## 2024-03-07 MED ORDER — FENTANYL CITRATE (PF) 100 MCG/2ML IJ SOLN
INTRAMUSCULAR | Status: DC | PRN
  Administered 2024-03-07: 15 ug via INTRATHECAL

## 2024-03-07 MED ORDER — MEDROXYPROGESTERONE ACETATE 150 MG/ML IM SUSP: 150.0000 mg | INTRAMUSCULAR | Status: DC | PRN

## 2024-03-07 MED ORDER — KETOROLAC TROMETHAMINE 30 MG/ML IJ SOLN
30.0000 mg | Freq: Four times a day (QID) | INTRAMUSCULAR | Status: DC
  Administered 2024-03-07 – 2024-03-08 (×2): 30 mg via INTRAVENOUS
  Filled 2024-03-07 (×3): qty 1

## 2024-03-07 MED ORDER — PHENYLEPHRINE HCL (PRESSORS) 10 MG/ML IV SOLN
INTRAVENOUS | Status: DC | PRN
  Administered 2024-03-07: 80 ug via INTRAVENOUS
  Administered 2024-03-07 (×3): 160 ug via INTRAVENOUS
  Administered 2024-03-07: 80 ug via INTRAVENOUS

## 2024-03-07 MED ORDER — DIPHENHYDRAMINE HCL 25 MG PO CAPS: 25.0000 mg | ORAL_CAPSULE | ORAL | Status: DC | PRN

## 2024-03-07 MED ORDER — PRENATAL MULTIVITAMIN CH
1.0000 | ORAL_TABLET | Freq: Every day | ORAL | Status: DC
  Administered 2024-03-08 – 2024-03-09 (×2): 1 via ORAL
  Filled 2024-03-07 (×2): qty 1

## 2024-03-07 MED ORDER — BUPIVACAINE IN DEXTROSE 0.75-8.25 % IT SOLN
INTRATHECAL | Status: AC
  Filled 2024-03-07: qty 2

## 2024-03-07 MED ORDER — CEFAZOLIN SODIUM-DEXTROSE 2-4 GM/100ML-% IV SOLN
INTRAVENOUS | Status: AC
  Filled 2024-03-07: qty 100

## 2024-03-07 MED ORDER — SIMETHICONE 80 MG PO CHEW
80.0000 mg | CHEWABLE_TABLET | Freq: Three times a day (TID) | ORAL | Status: DC
  Administered 2024-03-08 – 2024-03-09 (×5): 80 mg via ORAL
  Filled 2024-03-07 (×6): qty 1

## 2024-03-07 MED ORDER — KETOROLAC TROMETHAMINE 30 MG/ML IJ SOLN: 30.0000 mg | Freq: Four times a day (QID) | INTRAMUSCULAR | Status: AC | PRN

## 2024-03-07 MED ORDER — OXYCODONE HCL 5 MG PO TABS
5.0000 mg | ORAL_TABLET | ORAL | Status: DC | PRN
  Administered 2024-03-08 – 2024-03-09 (×4): 5 mg via ORAL
  Administered 2024-03-09: 10 mg via ORAL
  Filled 2024-03-07 (×4): qty 1
  Filled 2024-03-07: qty 2

## 2024-03-07 MED ORDER — DEXAMETHASONE SODIUM PHOSPHATE 10 MG/ML IJ SOLN
INTRAMUSCULAR | Status: DC | PRN
Start: 2024-03-07 — End: 2024-03-07
  Administered 2024-03-07: 10 mg via INTRAVENOUS

## 2024-03-07 MED ORDER — POVIDONE-IODINE 10 % EX SWAB
2.0000 | Freq: Once | CUTANEOUS | Status: AC
  Administered 2024-03-07: 2 via TOPICAL

## 2024-03-07 MED ORDER — DEXAMETHASONE SODIUM PHOSPHATE 10 MG/ML IJ SOLN
INTRAMUSCULAR | Status: AC
  Filled 2024-03-07: qty 1

## 2024-03-07 MED ORDER — SOD CITRATE-CITRIC ACID 500-334 MG/5ML PO SOLN
30.0000 mL | Freq: Once | ORAL | Status: AC
  Administered 2024-03-07: 30 mL via ORAL

## 2024-03-07 MED ORDER — STERILE WATER FOR IRRIGATION IR SOLN
Status: DC | PRN
  Administered 2024-03-07: 1

## 2024-03-07 MED ORDER — SODIUM CHLORIDE 0.9 % IR SOLN
Status: DC | PRN
  Administered 2024-03-07: 1

## 2024-03-07 MED ORDER — NALOXONE HCL 4 MG/10ML IJ SOLN: 1.0000 ug/kg/h | INTRAVENOUS | Status: DC | PRN

## 2024-03-07 MED ORDER — IBUPROFEN 800 MG PO TABS: 800.0000 mg | ORAL_TABLET | Freq: Three times a day (TID) | ORAL | Status: DC

## 2024-03-07 MED ORDER — DIPHENHYDRAMINE HCL 50 MG/ML IJ SOLN: 12.5000 mg | INTRAMUSCULAR | Status: DC | PRN

## 2024-03-07 MED ORDER — ACETAMINOPHEN 500 MG PO TABS
1000.0000 mg | ORAL_TABLET | Freq: Four times a day (QID) | ORAL | Status: DC | PRN
  Administered 2024-03-07: 1000 mg via ORAL

## 2024-03-07 MED ORDER — MORPHINE SULFATE (PF) 0.5 MG/ML IJ SOLN
INTRAMUSCULAR | Status: DC | PRN
  Administered 2024-03-07: .15 mg via INTRATHECAL

## 2024-03-07 MED ORDER — FERROUS SULFATE 325 (65 FE) MG PO TABS
325.0000 mg | ORAL_TABLET | ORAL | Status: DC
  Administered 2024-03-08: 325 mg via ORAL
  Filled 2024-03-07: qty 1

## 2024-03-07 MED ORDER — TRANEXAMIC ACID-NACL 1000-0.7 MG/100ML-% IV SOLN
1000.0000 mg | INTRAVENOUS | Status: AC
  Administered 2024-03-07: 1000 mg via INTRAVENOUS

## 2024-03-07 MED ORDER — LACTATED RINGERS IV SOLN: INTRAVENOUS | Status: DC

## 2024-03-07 MED ORDER — ONDANSETRON HCL 4 MG/2ML IJ SOLN
INTRAMUSCULAR | Status: AC
  Filled 2024-03-07: qty 2

## 2024-03-07 MED ORDER — FENTANYL CITRATE (PF) 100 MCG/2ML IJ SOLN: 25.0000 ug | INTRAMUSCULAR | Status: DC | PRN

## 2024-03-07 MED ORDER — ENOXAPARIN SODIUM 40 MG/0.4ML IJ SOSY
40.0000 mg | PREFILLED_SYRINGE | INTRAMUSCULAR | Status: DC
  Administered 2024-03-08 – 2024-03-09 (×2): 40 mg via SUBCUTANEOUS
  Filled 2024-03-07 (×2): qty 0.4

## 2024-03-07 MED ORDER — DIBUCAINE (PERIANAL) 1 % EX OINT: 1.0000 | TOPICAL_OINTMENT | CUTANEOUS | Status: DC | PRN

## 2024-03-07 MED ORDER — SIMETHICONE 80 MG PO CHEW: 80.0000 mg | CHEWABLE_TABLET | ORAL | Status: DC | PRN

## 2024-03-07 MED ORDER — DIPHENHYDRAMINE HCL 25 MG PO CAPS: 25.0000 mg | ORAL_CAPSULE | Freq: Four times a day (QID) | ORAL | Status: DC | PRN

## 2024-03-07 MED ORDER — SODIUM CHLORIDE 0.9% FLUSH
3.0000 mL | INTRAVENOUS | Status: DC | PRN
Start: 2024-03-07 — End: 2024-03-09

## 2024-03-07 MED ORDER — OXYTOCIN-SODIUM CHLORIDE 30-0.9 UT/500ML-% IV SOLN
2.5000 [IU]/h | INTRAVENOUS | Status: AC
  Administered 2024-03-07: 2.5 [IU]/h via INTRAVENOUS
  Filled 2024-03-07: qty 500

## 2024-03-07 MED ORDER — MORPHINE SULFATE (PF) 0.5 MG/ML IJ SOLN
INTRAMUSCULAR | Status: AC
  Filled 2024-03-07: qty 10

## 2024-03-07 MED ORDER — MORPHINE SULFATE (PF) 0.5 MG/ML IJ SOLN: INTRAMUSCULAR | Status: DC | PRN

## 2024-03-07 MED ORDER — ACETAMINOPHEN 500 MG PO TABS
1000.0000 mg | ORAL_TABLET | Freq: Three times a day (TID) | ORAL | Status: DC
  Administered 2024-03-07 – 2024-03-09 (×6): 1000 mg via ORAL
  Filled 2024-03-07 (×7): qty 2

## 2024-03-07 MED ORDER — WITCH HAZEL-GLYCERIN EX PADS: 1.0000 | MEDICATED_PAD | CUTANEOUS | Status: DC | PRN

## 2024-03-07 MED ORDER — SOD CITRATE-CITRIC ACID 500-334 MG/5ML PO SOLN
ORAL | Status: AC
  Filled 2024-03-07: qty 30

## 2024-03-07 MED ORDER — SCOPOLAMINE 1 MG/3DAYS TD PT72
MEDICATED_PATCH | TRANSDERMAL | Status: AC
  Filled 2024-03-07: qty 1

## 2024-03-07 MED ORDER — SODIUM CHLORIDE 0.9% IV SOLUTION: Freq: Once | INTRAVENOUS | Status: DC

## 2024-03-07 MED ORDER — MEPERIDINE HCL 25 MG/ML IJ SOLN: 6.2500 mg | INTRAMUSCULAR | Status: DC | PRN

## 2024-03-07 MED ORDER — MEASLES, MUMPS & RUBELLA VAC IJ SOLR
0.5000 mL | Freq: Once | INTRAMUSCULAR | Status: DC
Start: 2024-03-08 — End: 2024-03-09

## 2024-03-07 MED ORDER — ACETAMINOPHEN 500 MG PO TABS
ORAL_TABLET | ORAL | Status: AC
  Filled 2024-03-07: qty 2

## 2024-03-07 MED ORDER — SENNOSIDES-DOCUSATE SODIUM 8.6-50 MG PO TABS
2.0000 | ORAL_TABLET | Freq: Every day | ORAL | Status: DC
  Administered 2024-03-08 – 2024-03-09 (×2): 2 via ORAL
  Filled 2024-03-07 (×2): qty 2

## 2024-03-07 MED ORDER — ZOLPIDEM TARTRATE 5 MG PO TABS: 5.0000 mg | ORAL_TABLET | Freq: Every evening | ORAL | Status: DC | PRN

## 2024-03-07 MED ORDER — EPHEDRINE 5 MG/ML INJ
INTRAVENOUS | Status: AC
  Filled 2024-03-07: qty 15

## 2024-03-07 MED ORDER — GABAPENTIN 100 MG PO CAPS
100.0000 mg | ORAL_CAPSULE | Freq: Three times a day (TID) | ORAL | Status: DC
  Administered 2024-03-07 – 2024-03-09 (×5): 100 mg via ORAL
  Filled 2024-03-07 (×6): qty 1

## 2024-03-07 MED ORDER — FENTANYL CITRATE (PF) 100 MCG/2ML IJ SOLN
INTRAMUSCULAR | Status: AC
Start: 2024-03-07 — End: 2024-03-07
  Filled 2024-03-07: qty 2

## 2024-03-07 MED ORDER — BUPIVACAINE IN DEXTROSE 0.75-8.25 % IT SOLN
INTRATHECAL | Status: DC | PRN
  Administered 2024-03-07: 1.8 mL via INTRATHECAL

## 2024-03-07 MED ORDER — COCONUT OIL OIL: 1.0000 | TOPICAL_OIL | Status: DC | PRN

## 2024-03-07 MED ORDER — CEFAZOLIN SODIUM-DEXTROSE 2-4 GM/100ML-% IV SOLN
2.0000 g | INTRAVENOUS | Status: AC
  Administered 2024-03-07: 2 g via INTRAVENOUS

## 2024-03-07 MED ORDER — ONDANSETRON HCL 4 MG/2ML IJ SOLN
INTRAMUSCULAR | Status: DC | PRN
  Administered 2024-03-07: 4 mg via INTRAVENOUS

## 2024-03-07 MED ORDER — PHENYLEPHRINE 80 MCG/ML (10ML) SYRINGE FOR IV PUSH (FOR BLOOD PRESSURE SUPPORT)
PREFILLED_SYRINGE | INTRAVENOUS | Status: AC
  Filled 2024-03-07: qty 30

## 2024-03-07 MED ORDER — ONDANSETRON HCL 4 MG/2ML IJ SOLN
4.0000 mg | Freq: Three times a day (TID) | INTRAMUSCULAR | Status: DC | PRN
  Administered 2024-03-07: 4 mg via INTRAVENOUS
  Filled 2024-03-07: qty 2

## 2024-03-07 MED ORDER — NALOXONE HCL 0.4 MG/ML IJ SOLN
0.4000 mg | INTRAMUSCULAR | Status: DC | PRN
Start: 2024-03-07 — End: 2024-03-09

## 2024-03-07 MED ORDER — OXYTOCIN-SODIUM CHLORIDE 30-0.9 UT/500ML-% IV SOLN
INTRAVENOUS | Status: DC | PRN
  Administered 2024-03-07: 300 mL via INTRAVENOUS

## 2024-03-07 MED ORDER — PHENYLEPHRINE HCL-NACL 20-0.9 MG/250ML-% IV SOLN
INTRAVENOUS | Status: DC | PRN
  Administered 2024-03-07: 60 ug/min via INTRAVENOUS

## 2024-03-07 MED ORDER — SCOPOLAMINE 1 MG/3DAYS TD PT72
1.0000 | MEDICATED_PATCH | TRANSDERMAL | Status: DC
  Administered 2024-03-07: 1.5 mg via TRANSDERMAL

## 2024-03-07 SURGICAL SUPPLY — 26 items
CHLORAPREP W/TINT 26 (MISCELLANEOUS) ×4 IMPLANT
CLAMP UMBILICAL CORD (MISCELLANEOUS) ×2 IMPLANT
CLOTH BEACON ORANGE TIMEOUT ST (SAFETY) ×2 IMPLANT
DERMABOND ADVANCED .7 DNX12 (GAUZE/BANDAGES/DRESSINGS) ×4 IMPLANT
DRSG OPSITE POSTOP 4X10 (GAUZE/BANDAGES/DRESSINGS) ×2 IMPLANT
ELECTRODE REM PT RTRN 9FT ADLT (ELECTROSURGICAL) ×2 IMPLANT
EXTRACTOR VACUUM M CUP 4 TUBE (SUCTIONS) IMPLANT
GLOVE BIOGEL PI IND STRL 7.0 (GLOVE) ×4 IMPLANT
GLOVE BIOGEL PI IND STRL 7.5 (GLOVE) ×4 IMPLANT
GLOVE ECLIPSE 7.5 STRL STRAW (GLOVE) ×2 IMPLANT
GOWN STRL REUS W/TWL LRG LVL3 (GOWN DISPOSABLE) ×6 IMPLANT
KIT ABG SYR 3ML LUER SLIP (SYRINGE) IMPLANT
MAT PREVALON FULL STRYKER (MISCELLANEOUS) IMPLANT
NDL HYPO 25X5/8 SAFETYGLIDE (NEEDLE) IMPLANT
NEEDLE HYPO 25X5/8 SAFETYGLIDE (NEEDLE) IMPLANT
NS IRRIG 1000ML POUR BTL (IV SOLUTION) ×2 IMPLANT
PACK C SECTION WH (CUSTOM PROCEDURE TRAY) ×2 IMPLANT
PAD OB MATERNITY 4.3X12.25 (PERSONAL CARE ITEMS) ×2 IMPLANT
RTRCTR C-SECT PINK 25CM LRG (MISCELLANEOUS) ×2 IMPLANT
SUT MNCRL 0 VIOLET CTX 36 (SUTURE) ×4 IMPLANT
SUT VIC AB 0 CTX36XBRD ANBCTRL (SUTURE) ×2 IMPLANT
SUT VIC AB 2-0 CT1 TAPERPNT 27 (SUTURE) ×2 IMPLANT
SUT VIC AB 4-0 KS 27 (SUTURE) ×2 IMPLANT
TOWEL OR 17X24 6PK STRL BLUE (TOWEL DISPOSABLE) ×2 IMPLANT
TRAY FOLEY W/BAG SLVR 14FR LF (SET/KITS/TRAYS/PACK) ×2 IMPLANT
WATER STERILE IRR 1000ML POUR (IV SOLUTION) ×2 IMPLANT

## 2024-03-07 NOTE — Transfer of Care (Signed)
 Immediate Anesthesia Transfer of Care Note  Patient: Linda Duffy  Procedure(s) Performed: CESAREAN DELIVERY  Patient Location: PACU  Anesthesia Type:Spinal and Epidural  Level of Consciousness: awake, alert , and oriented  Airway & Oxygen Therapy: Patient Spontanous Breathing  Post-op Assessment: Report given to RN and Post -op Vital signs reviewed and stable  Post vital signs: Reviewed and stable  Last Vitals:  Vitals Value Taken Time  BP 114/61 03/07/24 13:26  Temp    Pulse 82 03/07/24 13:28  Resp 18 03/07/24 13:28  SpO2 100 % 03/07/24 13:28  Vitals shown include unfiled device data.  Last Pain:  Vitals:   03/07/24 1057  TempSrc:   PainSc: 0-No pain         Complications: No notable events documented.

## 2024-03-07 NOTE — Anesthesia Postprocedure Evaluation (Signed)
 Anesthesia Post Note  Patient: Oceanographer  Procedure(s) Performed: CESAREAN DELIVERY     Patient location during evaluation: PACU Anesthesia Type: Spinal Level of consciousness: awake and alert and oriented Pain management: pain level controlled Vital Signs Assessment: post-procedure vital signs reviewed and stable Respiratory status: spontaneous breathing, nonlabored ventilation and respiratory function stable Cardiovascular status: blood pressure returned to baseline and stable Postop Assessment: no apparent nausea or vomiting, spinal receding, patient able to bend at knees, no headache and no backache Anesthetic complications: no   No notable events documented.  Last Vitals:  Vitals:   03/07/24 1400 03/07/24 1415  BP: 107/64 112/66  Pulse:  88  Resp:  17  Temp:  36.6 C  SpO2:  99%    Last Pain:  Vitals:   03/07/24 1415  TempSrc: Oral  PainSc: 0-No pain   Pain Goal:    LLE Motor Response: Purposeful movement (03/07/24 1415) LLE Sensation: Tingling (03/07/24 1415) RLE Motor Response: Purposeful movement (03/07/24 1415) RLE Sensation: Tingling (03/07/24 1415)     Epidural/Spinal Function Cutaneous sensation: Pins and Needles (03/07/24 1415), Patient able to flex knees: Yes (03/07/24 1415), Patient able to lift hips off bed: No (03/07/24 1415), Back pain beyond tenderness at insertion site: No (03/07/24 1415), Progressively worsening motor and/or sensory loss: No (03/07/24 1415), Bowel and/or bladder incontinence post epidural: No (03/07/24 1415)  Hisao Doo A.

## 2024-03-07 NOTE — Anesthesia Procedure Notes (Signed)
 Spinal  Patient location during procedure: OR Start time: 03/07/2024 11:53 AM End time: 03/07/2024 11:59 AM Staffing Performed: anesthesiologist  Anesthesiologist: Jerrye Sharper, MD Performed by: Jerrye Sharper, MD Authorized by: Jerrye Sharper, MD   Preanesthetic Checklist Completed: patient identified, IV checked, site marked, risks and benefits discussed, surgical consent, monitors and equipment checked, pre-op evaluation and timeout performed Spinal Block Patient position: sitting Prep: DuraPrep and site prepped and draped Patient monitoring: cardiac monitor, continuous pulse ox, blood pressure and heart rate Approach: midline Location: L3-4 Injection technique: catheter Needle Needle type: Tuohy and Spinocan  Needle gauge: 24 G Needle length: 12.7 cm Needle insertion depth: 5.5 cm Catheter type: closed end flexible Catheter size: 19 g Catheter at skin depth: 5 cm Assessment Sensory level: T2 Events: CSF return Additional Notes Epidural performed using LOR with air technique. No CSF, Heme or paresthesias. SAB performed through the epidural needle using 24ga Spinocan needle. CSF clear with free flow and no paresthesias. Local anesthetic and narcotics injected through the spinal needle and withdrawn. Epidural catheter threaded 5cm into the epidural space and the epidural needle was withdrawn. A sterile dressing was applied and the patient placed supine with LUD. The patient tolerated the procedure well and adequate sensory level was obtained.

## 2024-03-07 NOTE — H&P (Signed)
 Faculty Practice H&P  Linda Duffy is a 38 y.o. female [phone] with IUP at [redacted]w[redacted]d presenting for elective repeat cesarean section. Pregnancy was been complicated by anemia, AMA, 5 prior c/sections.    Pt states she has been having no contractions, no vaginal bleeding, intact membranes, with normal fetal movement.     Prenatal Course Source of Care: [facility] with onset of care at 27 weeks  Pregnancy complications or risks: Patient Active Problem List   Diagnosis Date Noted   Iron  deficiency anemia during pregnancy 01/06/2024   Supervision of high risk pregnancy, antepartum 12/24/2023   Postoperative anemia 11/12/2019   History of group B Streptococcus (GBS) infection 11/01/2019   No transfusions per religious beliefs 09/29/2019   Hx of cesarean section 06/30/2019   Advanced maternal age in multigravida 06/16/2019   History of postpartum hypertension    Sickle cell trait in mother affecting pregnancy (HCC) 06/25/2016   Anemia affecting pregnancy in second trimester 06/09/2016   Late prenatal care affecting pregnancy in second trimester 06/02/2016   She uncertain about contraception.  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   History of postpartum hypertension    History of thrombosis of lower extremity    Ovarian cyst 2012   Pyelonephritis    Sickle cell trait (HCC)    Vaginal Pap smear, abnormal     Past Surgical History:  Past Surgical History:  Procedure Laterality Date   CERVIX  LESION DESTRUCTION     after 1st c-section and nl paps after that   CESAREAN SECTION     x1   CESAREAN SECTION N/A 05/24/2015   Procedure: REPEAT CESAREAN SECTION;  Surgeon: Aida DELENA Na, MD;  Location: [facility];  Service: Obstetrics;  Laterality: N/A;   CESAREAN SECTION N/A 07/04/2016   Procedure: CESAREAN SECTION;  Surgeon: Elveria Mungo, MD;  Location: [facility] BIRTHING SUITES;  Service: Obstetrics;  Laterality: N/A;   CESAREAN SECTION N/A 02/17/2018   Procedure: REPEAT CESAREAN SECTION;  Surgeon: Kandis Devaughn Sayres, MD;  Location: [facility] BIRTHING SUITES;  Service: Obstetrics;  Laterality: N/A;   CESAREAN SECTION N/A 11/10/2019   Procedure: CESAREAN SECTION;  Surgeon: Alger Gong, MD;  Location: [facility] LD ORS;  Service: Obstetrics;  Laterality: N/A;   DILATION AND CURETTAGE OF UTERUS     x2 for EABs    Obstetrical History:  OB History     Gravida  9   Para  5   Term  5   Preterm  0   AB  3   Living  5      SAB  1   IAB  2   Ectopic  0   Multiple  0   Live Births  5           Gynecological History:  OB History     Gravida  9   Para  5   Term  5   Preterm  0   AB  3   Living  5      SAB  1   IAB  2   Ectopic  0   Multiple  0   Live Births  5           Social History:  Social History   Socioeconomic History   Marital status: Single    Spouse name: Not on file   Number of children: Not on file   Years of education: Not on file   Highest education level: Not on file  Occupational History   Not on file  Tobacco Use   Smoking status: Former    Current packs/day: 0.00    Types: Cigarettes    Quit date: 09/18/2014    Years since quitting: 9.4   Smokeless tobacco: Never  Vaping Use   Vaping status: Never Used  Substance and Sexual Activity   Alcohol use: No   Drug use: Not Currently    Types: Marijuana    Comment: not since she found out she was pregnant   Sexual activity: Yes    Birth control/protection: None     Comment: took 1 week of ocp's in march  Other Topics Concern   Not on file  Social History Narrative   Not on file   Social Drivers of Health   Financial Resource Strain: Not on file  Food Insecurity: No Food Insecurity (11/17/2022)   Hunger Vital Sign    Worried About Running Out of Food in the Last Year: Never true    Ran Out of Food in the Last Year: Never true  Transportation Needs: No Transportation Needs (11/17/2022)   PRAPARE - Administrator, Civil Service (Medical): No    Lack of Transportation (Non-Medical): No  Physical Activity: Not on file  Stress: Not on file  Social Connections: Not on file    Family History:  Family History  Problem Relation Age of Onset   Diabetes Mother    Hypertension Mother    Heart disease Mother    Asthma Mother     Medications:  Prenatal vitamins,  Current Facility-Administered Medications  Medication Dose Route Frequency Provider Last Rate Last Admin   0.9 %  sodium chloride  infusion (Manually program via Guardrails IV Fluids)   Intravenous Once Jerrye Sharper, MD       acetaminophen  (TYLENOL ) tablet 1,000 mg  1,000 mg Oral Q6H PRN Jerrye Sharper, MD       ceFAZolin  (ANCEF ) IVPB 2g/100 mL premix  2 g Intravenous On Call to OR Masato Pettie J, DO       lactated ringers  infusion   Intravenous Continuous Dallas Scorsone J, DO       povidone-iodine  10 % swab 2 Application  2 Application Topical Once Bethanne Mule J, DO       scopolamine  (TRANSDERM-SCOP) 1 MG/3DAYS 1.5 mg  1 patch Transdermal Q72H Jerrye Sharper, MD       sodium citrate -citric acid  (ORACIT) solution 30 mL  30 mL Oral Once Jerrye Sharper, MD       tranexamic acid  (CYKLOKAPRON ) IVPB 1,000 mg  1,000 mg Intravenous To OR Levon Penning J, DO        Allergies: No Known Allergies  Review of Systems: - negative  Physical Exam: Blood pressure 117/80, pulse 98, temperature 98.7 F (37.1 C), temperature source Oral, resp. rate 17, height 5' 7 (1.702 m), weight  80 kg, last menstrual period 09/14/2023, SpO2 100%, not currently breastfeeding. GENERAL: Well-developed, well-nourished female in no acute distress.  LUNGS: Clear  to auscultation bilaterally.  HEART: Regular rate and rhythm. ABDOMEN: Soft, nontender, nondistended, gravid.  EXTREMITIES: Nontender, no edema, 2+ distal pulses.    Pertinent Labs/Studies:   Lab Results  Component Value Date   WBC 7.8 03/04/2024   HGB 8.8 (L) 03/04/2024   HCT 26.1 (L) 03/04/2024   MCV 95.3 03/04/2024   PLT 243 03/04/2024    Assessment : Linda Duffy is a 38 y.o. [id number] at [redacted]w[redacted]d being admitted for cesarean section secondary to elective repeat  Plan: The risks of cesarean section discussed with the patient included but were not limited to: bleeding which may require transfusion or reoperation; infection which may require antibiotics; injury to bowel, bladder, ureters or other surrounding organs; injury to the fetus; need for additional procedures including hysterectomy in the event of a life-threatening hemorrhage; placental abnormalities wth subsequent pregnancies, incisional problems, thromboembolic phenomenon and other postoperative/anesthesia complications. The patient concurred with the proposed plan, giving informed written consent for the procedure.   Patient has been NPO since last night and will remain NPO for procedure.  Preoperative prophylactic Ancef  ordered on call to the OR.   Expecting Boy, no circ Breastfeed  I discussed blood products with the patient - she accepts blood in life-threatening situation. Crossmatched for 2 units.  Dodie Parisi J, DO 03/07/2024, 10:44 AM

## 2024-03-07 NOTE — Lactation Note (Signed)
 This note was copied from a baby's chart. Lactation Consultation Note  Patient Name: Linda Duffy Unijb'd Date: 03/07/2024 Age:40 hours  Reason for consult: Initial assessment;Term  P6, [redacted]w[redacted]d, 6th C/S  Initial LC visit to see mother and baby Linda Lavetta. Mother reports baby is breastfeeding very well. She denies any questions of concerns at this time. Mother has had some post surgery nausea but currently feeling good.  Mother encouraged to latch baby with feeding cues, place baby skin to skin if not latching, and call for assistance with breastfeeding as needed. Informed mother regarding cluster feeding as baby approaches 24 hours of age. Baby should  increase feeding frequency, breastfeeding 8-12 times in 24 hours, and cluster feedings. Cluster feeding helps to stimulate milk production.    Mom made aware of O/P services, breastfeeding support groups, community resources, and our phone # for post-discharge questions. Mother received her [facility] and informed of the OP Lactation support offered post discharge.      Maternal Data Does the patient have breastfeeding experience prior to this delivery?: Yes How long did the patient breastfeed?: breast fed her other 5 children for 2 years each  Feeding Mother's Current Feeding Choice: Breast Milk  Interventions Interventions: Education;CDC milk storage guidelines;CDC Guidelines for Breast Pump Cleaning;LC Services brochure  Discharge Pump: Hands Free;Personal  Consult Status Consult Status: Follow-up Date: 03/08/24 Follow-up type: In-patient    Joshua Line M 03/07/2024, 6:49 PM

## 2024-03-07 NOTE — Op Note (Signed)
 Cesarean Section Operative Note   Patient: Linda Duffy  Date of Procedure: 03/07/2024  Procedure: Repeat Low Transverse Cesarean   Indications: previous uterine incision: low transverse x5  Pre-operative Diagnosis: Repeat cesarean times six  Post-operative Diagnosis: Same  TOLAC Candidate: No  Surgeon: Surgeons and Role:    * Barbra Lang PARAS, DO - Primary    * Nicholaus Almarie HERO, MD - Assisting    * Abigail Rollo DASEN, MD - Assisting  Assistants: An experienced assistant was required given the standard of surgical care given the complexity of the case.  This assistant was needed for exposure, dissection, suctioning, retraction, instrument exchange, assisting with delivery with administration of fundal pressure, and for overall help during the procedure.   Anesthesia: spinal  Anesthesiologist: Jerrye Sharper, MD   Antibiotics: Cefazolin    Estimated Blood Loss: 624 ml   Total IV Fluids: 2200 ml  Urine Output: 150 cc OF clear urine  Specimens: None  Complications: no complications   Indications: Kimra Kantor is a 38 y.o. [id number] with an IUP [redacted]w[redacted]d presenting for scheduled cesarean secondary to the indications listed above. Clinical course notable for AMA, anemia.  The risks of cesarean section discussed with the patient included but were not limited to: bleeding which may require transfusion or reoperation; infection which may require antibiotics; injury to bowel, bladder, ureters or other surrounding organs; injury to the fetus; need for additional procedures including hysterectomy in the event of a life-threatening hemorrhage; placental abnormalities with subsequent pregnancies, incisional problems, thromboembolic phenomenon and other postoperative/anesthesia complications. The patient concurred with the proposed plan, giving informed written consent for the procedure. Patient has been NPO since last night she will remain NPO for procedure. Anesthesia and OR aware. Preoperative  prophylactic antibiotics and SCDs ordered on call to the OR.   Findings: Viable infant in cephalic presentation, no nuchal cord present. Apgars 8, 9, . Weight 3200 g. Clear amniotic fluid. Normal placenta, three vessel cord. Normal uterus, Normal bilateral fallopian tubes, Normal bilateral ovaries. Severe adhesive disease, between fascia and rectus. Additionally no subcutaneous tissue present - all scar tissue. Minimal intraperitoneal adhesions.  Procedure Details: A Time Out was held and the above information confirmed. The patient received intravenous antibiotics and had sequential compression devices applied to her lower extremities preoperatively. The patient was taken back to the operative suite where spinal anesthesia was administered. After induction of anesthesia, the patient was draped and prepped in the usual sterile manner and placed in a dorsal supine position with a leftward tilt. A low transverse skin incision was made with scalpel over prior incisions and carried down through the subcutaneous tissue/scar tissue to the fascia. Fascial incision was made and extended transversely via sharp dissection. Very thin fascia/rectus layer midline over uterus. The fascia was separated from the underlying rectus tissue superiorly and inferiorly with sharp dissection. The rectus muscles were separated in the midline sharply. No visible peritoneal tissue. An Alexis retractor was placed to aid in visualization of the uterus. The utero-vesical peritoneal reflection was incised transversely and the bladder flap was bluntly freed from the lower uterine segment. A low transverse uterine incision was made. The infant was successfully delivered from cephalic presentation, the umbilical cord was clamped after 1 minute. Cord ph was not sent, and cord blood was obtained for evaluation. The placenta was removed Intact and appeared normal. The uterine incision was closed with a single layer running unlocked suture of  0-Monocryl. Due to ongoing bleeding from the hysterotomy figure of eight sutures of  0 Monocryl were placed after which there was excellent hemostasis. The abdomen and the pelvis were cleared of all clot and debris and the Thersia was removed. Hemostasis was confirmed on all surfaces.  The peritoneum was was not reapproximated (no visible peritoneum). The fascia was then closed using 0-Vicryl in a running fashion. The subcutaneous layer was not reapproximated (almost exclusively scar tissue and well-approximated). The skin was closed with a 4-0 vicryl subcuticular stitch. Dermabond and honeycomb dressings applied. The patient tolerated the procedure well. Sponge, lap, instrument and needle counts were correct x 2. She was taken to the recovery room in stable condition.  Disposition: PACU - hemodynamically stable.    Signed: Almarie CHRISTELLA Moats, MD OB Fellow, [facility] for [facility], [facility]

## 2024-03-07 NOTE — Discharge Summary (Signed)
 Postpartum Discharge Summary  Date of Service updated***     Patient Name: Linda Duffy DOB: 02/20/1986 MRN: [id number]  Date of admission: 03/07/2024 Delivery date:03/07/2024 Delivering provider: BARBRA LANG PARAS Date of discharge: 03/07/2024  Admitting diagnosis: Pregnancy [Z34.90] Intrauterine pregnancy: [redacted]w[redacted]d     Secondary diagnosis:  Principal Problem:   Status post repeat low transverse cesarean section Active Problems:   Late prenatal care affecting pregnancy in second trimester   Anemia complicating pregnancy   Sickle cell trait in mother affecting pregnancy (HCC)   Advanced maternal age in multigravida   History of postpartum hypertension   No transfusions per religious beliefs   Supervision of high risk pregnancy, antepartum   Pregnancy  Additional problems: ***    Discharge diagnosis: Term Pregnancy Delivered and Anemia                                              Post partum procedures:{Postpartum procedures:23558} Augmentation: N/A Complications: None  Hospital course: 38 y.o. yo H0E3963 at 105w0d was admitted to the hospital 03/07/2024 for scheduled cesarean section with the following indication:prior C/S x5.Delivery details are as follows:  Membrane Rupture Time/Date: 12:30 PM,03/07/2024  Delivery Method:C-Section, Low Transverse Operative Delivery:N/A Details of operation can be found in separate operative note.  Patient had a postpartum course complicated by***.  She is ambulating, tolerating a regular diet, passing flatus, and urinating well. Patient is discharged home in stable condition on  03/07/24        Newborn Data: Birth date:03/07/2024 Birth time:12:31 PM Gender:Female Living status:Living Apgars:8 ,9  Weight:3200 g    Magnesium  Sulfate received: No BMZ received: No Rhophylac:No MMR:No T-DaP:Given prenatally Flu: No RSV Vaccine received: No Transfusion:{Transfusion received:30440034}  Immunizations received: Immunization History   Administered Date(s) Administered   Tdap 03/22/2015, 06/02/2016, 01/07/2018, 09/14/2019, 12/24/2023    Physical exam  Vitals:   03/07/24 1026  BP: 117/80  Pulse: 98  Resp: 17  Temp: 98.7 F (37.1 C)  TempSrc: Oral  SpO2: 100%  Weight: 80 kg  Height: 5' 7 (1.702 m)   General: {Exam; general:21111117} Lochia: {Desc; appropriate/inappropriate:30686::appropriate} Uterine Fundus: {Desc; firm/soft:30687} Incision: {Exam; incision:21111123} DVT Evaluation: {Exam; dvt:2111122} Labs: Lab Results  Component Value Date   WBC 7.8 03/04/2024   HGB 8.8 (L) 03/04/2024   HCT 26.1 (L) 03/04/2024   MCV 95.3 03/04/2024   PLT 243 03/04/2024      Latest Ref Rng & Units 12/24/2023    4:20 PM  CMP  Glucose 70 - 99 mg/dL 871   BUN 6 - 20 mg/dL 3   Creatinine 9.42 - 8.99 mg/dL 9.42   Sodium 865 - 855 mmol/L 138   Potassium 3.5 - 5.2 mmol/L 3.5   Chloride 96 - 106 mmol/L 105   CO2 20 - 29 mmol/L 18   Calcium  8.7 - 10.2 mg/dL 8.9   Total Protein 6.0 - 8.5 g/dL 6.1   Total Bilirubin 0.0 - 1.2 mg/dL 0.2   Alkaline Phos 44 - 121 IU/L 112   AST 0 - 40 IU/L 12   ALT 0 - 32 IU/L 6    Edinburgh Score:    12/15/2019    1:50 PM  Edinburgh Postnatal Depression Scale Screening Tool  I have been able to laugh and see the funny side of things. 0  I have looked forward with enjoyment to things. 0  I have blamed myself unnecessarily when things went wrong. 0  I have been anxious or worried for no good reason. 0  I have felt scared or panicky for no good reason. 0  Things have been getting on top of me. 0  I have been so unhappy that I have had difficulty sleeping. 0  I have felt sad or miserable. 0  I have been so unhappy that I have been crying. 0  The thought of harming myself has occurred to me. 0  Edinburgh Postnatal Depression Scale Total 0      Data saved with a previous flowsheet row definition   No data recorded  After visit meds:  Allergies as of 03/07/2024   No Known  Allergies   Med Rec must be completed prior to using this [facility]***        Discharge home in stable condition Infant Feeding: {Baby feeding:23562} Infant Disposition:{CHL IP OB HOME WITH FNUYZM:76418} Discharge instruction: per After Visit Summary and Postpartum booklet. Activity: Advance as tolerated. Pelvic rest for 6 weeks.  Diet: {OB ipzu:78888878} Future Appointments:No future appointments. Follow up Visit:  Message sent to [facility] 7/14  Please schedule this patient for a In person postpartum visit in 6 weeks with the following provider: Any provider. Additional Postpartum F/U: Incision check 1 week  High risk pregnancy complicated by: AMA Delivery mode:  C-Section, Low Transverse Anticipated Birth Control:  Unsure   03/07/2024 Almarie CHRISTELLA Moats, MD

## 2024-03-08 LAB — TYPE AND SCREEN
ABO/RH(D): O POS
Antibody Screen: NEGATIVE
Unit division: 0
Unit division: 0

## 2024-03-08 LAB — BPAM RBC
Blood Product Expiration Date: 202508112359
Blood Product Expiration Date: 202508112359
Unit Type and Rh: 5100
Unit Type and Rh: 5100

## 2024-03-08 LAB — CBC
HCT: 22.2 % — ABNORMAL LOW (ref 36.0–46.0)
Hemoglobin: 7.7 g/dL — ABNORMAL LOW (ref 12.0–15.0)
MCH: 33.5 pg (ref 26.0–34.0)
MCHC: 34.7 g/dL (ref 30.0–36.0)
MCV: 96.5 fL (ref 80.0–100.0)
Platelets: 193 K/uL (ref 150–400)
RBC: 2.3 MIL/uL — ABNORMAL LOW (ref 3.87–5.11)
RDW: 15.2 % (ref 11.5–15.5)
WBC: 16.3 K/uL — ABNORMAL HIGH (ref 4.0–10.5)
nRBC: 0 % (ref 0.0–0.2)

## 2024-03-08 MED ORDER — ALBUTEROL SULFATE (2.5 MG/3ML) 0.083% IN NEBU: 2.5000 mg | INHALATION_SOLUTION | Freq: Once | RESPIRATORY_TRACT | Status: DC | PRN

## 2024-03-08 MED ORDER — SODIUM CHLORIDE 0.9 % IV SOLN
500.0000 mg | Freq: Once | INTRAVENOUS | Status: AC
  Administered 2024-03-08: 500 mg via INTRAVENOUS
  Filled 2024-03-08: qty 25

## 2024-03-08 MED ORDER — IBUPROFEN 800 MG PO TABS
800.0000 mg | ORAL_TABLET | Freq: Three times a day (TID) | ORAL | Status: DC
  Administered 2024-03-08 – 2024-03-09 (×5): 800 mg via ORAL
  Filled 2024-03-08 (×5): qty 1

## 2024-03-08 MED ORDER — SODIUM CHLORIDE 0.9 % IV SOLN: INTRAVENOUS | Status: AC | PRN

## 2024-03-08 MED ORDER — SODIUM CHLORIDE 0.9 % IV BOLUS: 500.0000 mL | Freq: Once | INTRAVENOUS | Status: DC | PRN

## 2024-03-08 MED ORDER — EPINEPHRINE 0.3 MG/0.3ML IJ SOAJ: 0.3000 mg | Freq: Once | INTRAMUSCULAR | Status: DC | PRN

## 2024-03-08 MED ORDER — METHYLPREDNISOLONE SODIUM SUCC 125 MG IJ SOLR: 125.0000 mg | Freq: Once | INTRAMUSCULAR | Status: DC | PRN

## 2024-03-08 MED ORDER — DIPHENHYDRAMINE HCL 50 MG/ML IJ SOLN: 25.0000 mg | Freq: Once | INTRAMUSCULAR | Status: DC | PRN

## 2024-03-08 NOTE — Progress Notes (Signed)
 Subjective: Postpartum Day 1: Cesarean Delivery Patient reports incisional pain, tolerating PO, and no problems voiding.    Objective: Vital signs in last 24 hours: Temp:  [97.8 F (36.6 C)-98.7 F (37.1 C)] 97.9 F (36.6 C) (07/15 0528) Pulse Rate:  [62-98] 63 (07/15 0528) Resp:  [14-25] 16 (07/15 0528) BP: (97-117)/(51-80) 101/63 (07/15 0528) SpO2:  [92 %-100 %] 100 % (07/15 0528) Weight:  [80 kg] 80 kg (07/14 1026)  Physical Exam:  General: alert, cooperative, and no distress Lochia: appropriate Uterine Fundus: firm Incision: healing well, no significant drainage DVT Evaluation: No evidence of DVT seen on physical exam.  CBC    Component Value Date/Time   WBC 7.8 03/04/2024 1030   RBC 2.74 (L) 03/04/2024 1030   HGB 8.8 (L) 03/04/2024 1030   HGB 8.2 (L) 01/12/2024 1452   HCT 26.1 (L) 03/04/2024 1030   HCT 25.4 (L) 01/12/2024 1452   PLT 243 03/04/2024 1030   PLT 254 01/12/2024 1452   MCV 95.3 03/04/2024 1030   MCV 95 01/12/2024 1452   MCH 32.1 03/04/2024 1030   MCHC 33.7 03/04/2024 1030   RDW 15.2 03/04/2024 1030   RDW 15.6 (H) 01/12/2024 1452   LYMPHSABS 3.3 (H) 12/24/2023 1620   MONOABS 0.1 12/07/2023 1828   EOSABS 0.2 12/24/2023 1620   BASOSABS 0.1 12/24/2023 1620     Assessment/Plan: Status post Cesarean section. Doing well postoperatively.  Continue current care. CBC ordered this am  Lynwood Solomons, MD 03/08/2024, 6:29 AM

## 2024-03-09 ENCOUNTER — Encounter (HOSPITAL_COMMUNITY): Payer: Self-pay | Admitting: Family Medicine

## 2024-03-09 DIAGNOSIS — Z049 Encounter for examination and observation for unspecified reason: Secondary | ICD-10-CM

## 2024-03-09 MED ORDER — ACETAMINOPHEN 500 MG PO TABS: 1000.0000 mg | ORAL_TABLET | Freq: Four times a day (QID) | ORAL | Status: AC | PRN

## 2024-03-09 MED ORDER — POLYETHYLENE GLYCOL 3350 17 GM/SCOOP PO POWD: 17.0000 g | Freq: Every day | ORAL | 1 refills | Status: AC | PRN

## 2024-03-09 MED ORDER — IBUPROFEN 600 MG PO TABS: 600.0000 mg | ORAL_TABLET | Freq: Four times a day (QID) | ORAL | 1 refills | Status: AC | PRN

## 2024-03-09 MED ORDER — GABAPENTIN 100 MG PO CAPS: 300.0000 mg | ORAL_CAPSULE | Freq: Three times a day (TID) | ORAL | 0 refills | Status: DC

## 2024-03-09 MED ORDER — OXYCODONE HCL 5 MG PO TABS: 5.0000 mg | ORAL_TABLET | ORAL | 0 refills | Status: AC | PRN

## 2024-03-09 NOTE — Lactation Note (Signed)
 This note was copied from a baby's chart. Lactation Consultation Note  Patient Name: Linda Duffy Date: 03/09/2024 Age:40 hours Reason for consult: Term;Follow-up assessment;Infant weight loss (Infant with weight loss of -4.06%). See MOB : MR- hx Anemia, AMA Mob latched infant on her left breast using the cradle hold, pillow support provided, infant sustained his latch and swallows heard, infant was still breastfeeding after 10 minutes when LC left the room. MOB is experienced with breastfeeding see maternal data below. MOB will continue to breastfeed infant by cues, on demand, 8+ times within 24 hours. MOB informed LC infant is cluster feeding today, MOB is latching infant on her first breast and if he is still cuing to feed she is offering the 2nd breast during the same feeding. Infant is breastfeeding for 25 to 30 minutes most feedings today. MOB know to call if she has breastfeeding questions, concerns or needs latch assistance.    Maternal Data Does the patient have breastfeeding experience prior to this delivery?: Yes How long did the patient breastfeed?: Per MOB, she breastfeed all previous children for 2 years each.  Feeding Mother's Current Feeding Choice: Breast Milk  LATCH Score Latch: Grasps breast easily, tongue down, lips flanged, rhythmical sucking.  Audible Swallowing: Spontaneous and intermittent  Type of Nipple: Everted at rest and after stimulation  Comfort (Breast/Nipple): Soft / non-tender  Hold (Positioning): Assistance needed to correctly position infant at breast and maintain latch.  LATCH Score: 9   Lactation Tools Discussed/Used    Interventions Interventions: Breast compression;Adjust position;Support pillows;Position options;Assisted with latch;Education  Discharge    Consult Status      Linda Duffy 03/09/2024, 1:04 AM

## 2024-03-09 NOTE — Patient Instructions (Signed)

## 2024-03-09 NOTE — Lactation Note (Signed)
 This note was copied from a baby's chart. Lactation Consultation Note  Patient Name: Linda Duffy Date: 03/09/2024 Age:40 hours Reason for consult: Follow-up assessment;Term;Infant weight loss (8 % weight loss) per mom breast feeding is going well and the milk feels like its coming in.  LC reviewed breast feeding D/C teaching and provided mom with hand pump. Mom aware of LC resources .   Maternal Data Has patient been taught Hand Expression?:  (experienced breast feeder)  Feeding Mother's Current Feeding Choice: Breast Milk  LATCH Score - 9's     Lactation Tools Discussed/Used Tools: Pump;Flanges Flange Size: 18;21;24 Breast pump type: Manual Pump Education: Milk Storage;Setup, frequency, and cleaning Reason for Pumping: PRN  Interventions Interventions: Breast feeding basics reviewed;Hand pump;Education;LC Services brochure;CDC milk storage guidelines;CDC Guidelines for Breast Pump Cleaning  Discharge Discharge Education: Engorgement and breast care;Warning signs for feeding baby Pump: Hands Free;Manual;Personal  Consult Status Consult Status: Complete Date: 03/09/24    Rollene Jenkins Fiedler 03/09/2024, 1:53 PM

## 2024-03-14 ENCOUNTER — Encounter: Payer: Self-pay | Admitting: Family Medicine

## 2024-03-15 ENCOUNTER — Ambulatory Visit (INDEPENDENT_AMBULATORY_CARE_PROVIDER_SITE_OTHER)

## 2024-03-15 ENCOUNTER — Other Ambulatory Visit: Payer: Self-pay

## 2024-03-15 VITALS — BP 125/73 | HR 65 | Ht 67.0 in | Wt 171.0 lb

## 2024-03-15 DIAGNOSIS — Z98891 History of uterine scar from previous surgery: Secondary | ICD-10-CM

## 2024-03-15 MED ORDER — GABAPENTIN 100 MG PO CAPS: 300.0000 mg | ORAL_CAPSULE | Freq: Three times a day (TID) | ORAL | 0 refills | Status: AC

## 2024-03-15 NOTE — Progress Notes (Signed)
 Incision Check Visit  Linda Duffy is here for incision check following repeat c-section on 03/07/2024.   Assessment: Incision is clean, dry and intact. Pt reports small amounts of bleeding after removal of honeycomb, but none noted today.   Education: Reviewed good wound care and s/s of infection with patient.  Patient will follow up for a post partum visit  on 04/19/2024. Pt denies any further questions or concerns.   Cyndee JAYSON Molt, RN 03/15/2024  12:14 PM

## 2024-03-18 ENCOUNTER — Encounter (HOSPITAL_COMMUNITY): Payer: Self-pay | Admitting: Obstetrics & Gynecology

## 2024-03-18 ENCOUNTER — Inpatient Hospital Stay (HOSPITAL_COMMUNITY)
Admission: AD | Admit: 2024-03-18 | Discharge: 2024-03-19 | Disposition: A | Discharge status: Home / Self Care | Attending: Obstetrics & Gynecology | Admitting: Obstetrics & Gynecology

## 2024-03-18 DIAGNOSIS — O9089 Other complications of the puerperium, not elsewhere classified: Secondary | ICD-10-CM | POA: Insufficient documentation

## 2024-03-18 DIAGNOSIS — O165 Unspecified maternal hypertension, complicating the puerperium: Secondary | ICD-10-CM | POA: Insufficient documentation

## 2024-03-18 DIAGNOSIS — O1415 Severe pre-eclampsia, complicating the puerperium: Secondary | ICD-10-CM

## 2024-03-18 DIAGNOSIS — I10 Essential (primary) hypertension: Secondary | ICD-10-CM

## 2024-03-18 DIAGNOSIS — R519 Headache, unspecified: Secondary | ICD-10-CM | POA: Diagnosis not present

## 2024-03-18 DIAGNOSIS — G4452 New daily persistent headache (NDPH): Secondary | ICD-10-CM

## 2024-03-18 LAB — CBC WITH DIFFERENTIAL/PLATELET
Abs Immature Granulocytes: 0.05 K/uL (ref 0.00–0.07)
Basophils Absolute: 0.1 K/uL (ref 0.0–0.1)
Basophils Relative: 1 %
Eosinophils Absolute: 0.1 K/uL (ref 0.0–0.5)
Eosinophils Relative: 1 %
HCT: 28 % — ABNORMAL LOW (ref 36.0–46.0)
Hemoglobin: 9.5 g/dL — ABNORMAL LOW (ref 12.0–15.0)
Immature Granulocytes: 1 %
Lymphocytes Relative: 22 %
Lymphs Abs: 2.1 K/uL (ref 0.7–4.0)
MCH: 32.9 pg (ref 26.0–34.0)
MCHC: 33.9 g/dL (ref 30.0–36.0)
MCV: 96.9 fL (ref 80.0–100.0)
Monocytes Absolute: 0.6 K/uL (ref 0.1–1.0)
Monocytes Relative: 6 %
Neutro Abs: 6.8 K/uL (ref 1.7–7.7)
Neutrophils Relative %: 69 %
Platelets: 329 K/uL (ref 150–400)
RBC: 2.89 MIL/uL — ABNORMAL LOW (ref 3.87–5.11)
RDW: 14.6 % (ref 11.5–15.5)
WBC: 9.7 K/uL (ref 4.0–10.5)
nRBC: 0 % (ref 0.0–0.2)

## 2024-03-18 LAB — COMPREHENSIVE METABOLIC PANEL WITH GFR
ALT: 16 U/L (ref 0–44)
AST: 17 U/L (ref 15–41)
Albumin: 3.1 g/dL — ABNORMAL LOW (ref 3.5–5.0)
Alkaline Phosphatase: 88 U/L (ref 38–126)
Anion gap: 8 (ref 5–15)
BUN: 13 mg/dL (ref 6–20)
CO2: 22 mmol/L (ref 22–32)
Calcium: 8.6 mg/dL — ABNORMAL LOW (ref 8.9–10.3)
Chloride: 111 mmol/L (ref 98–111)
Creatinine, Ser: 0.86 mg/dL (ref 0.44–1.00)
GFR, Estimated: >60 mL/min (ref >60)
Glucose, Bld: 102 mg/dL — ABNORMAL HIGH (ref 70–99)
Potassium: 4.3 mmol/L (ref 3.5–5.1)
Sodium: 141 mmol/L (ref 135–145)
Total Bilirubin: 0.7 mg/dL (ref 0.0–1.2)
Total Protein: 6.1 g/dL — ABNORMAL LOW (ref 6.5–8.1)

## 2024-03-18 MED ORDER — BUTALBITAL-APAP-CAFFEINE 50-325-40 MG PO TABS
2.0000 | ORAL_TABLET | Freq: Once | ORAL | Status: AC
  Administered 2024-03-18: 2 via ORAL
  Filled 2024-03-18: qty 2

## 2024-03-18 MED ORDER — MIRTAZAPINE 15 MG PO TABS
15.0000 mg | ORAL_TABLET | Freq: Every day | ORAL | Status: DC
  Administered 2024-03-18: 15 mg via ORAL
  Filled 2024-03-18: qty 1

## 2024-03-18 MED ORDER — OXYCODONE HCL 5 MG PO TABS
5.0000 mg | ORAL_TABLET | Freq: Once | ORAL | Status: AC
  Administered 2024-03-18: 5 mg via ORAL
  Filled 2024-03-18: qty 1

## 2024-03-18 MED ORDER — DIPHENHYDRAMINE HCL 12.5 MG/5ML PO ELIX: 12.5000 mg | ORAL_SOLUTION | Freq: Four times a day (QID) | ORAL | Status: DC | PRN

## 2024-03-18 MED ORDER — NIFEDIPINE ER OSMOTIC RELEASE 30 MG PO TB24
30.0000 mg | ORAL_TABLET | Freq: Two times a day (BID) | ORAL | Status: DC
  Administered 2024-03-18: 30 mg via ORAL
  Filled 2024-03-18: qty 1

## 2024-03-18 MED ORDER — ACETAMINOPHEN 500 MG PO TABS: 1000.0000 mg | ORAL_TABLET | Freq: Once | ORAL | Status: DC

## 2024-03-18 MED ORDER — LABETALOL HCL 5 MG/ML IV SOLN
40.0000 mg | INTRAVENOUS | Status: DC | PRN
  Administered 2024-03-18: 40 mg via INTRAVENOUS
  Filled 2024-03-18: qty 8

## 2024-03-18 MED ORDER — LACTATED RINGERS IV BOLUS
1000.0000 mL | Freq: Once | INTRAVENOUS | Status: AC
  Administered 2024-03-18: 1000 mL via INTRAVENOUS

## 2024-03-18 MED ORDER — DEXAMETHASONE SODIUM PHOSPHATE 10 MG/ML IJ SOLN
10.0000 mg | Freq: Once | INTRAMUSCULAR | Status: AC
  Administered 2024-03-18: 10 mg via INTRAVENOUS
  Filled 2024-03-18: qty 1

## 2024-03-18 MED ORDER — DEXAMETHASONE SODIUM PHOSPHATE 10 MG/ML IJ SOLN
10.0000 mg | Freq: Once | INTRAMUSCULAR | Status: DC
Start: 2024-03-18 — End: 2024-03-18
  Filled 2024-03-18: qty 1

## 2024-03-18 MED ORDER — HYDRALAZINE HCL 20 MG/ML IJ SOLN: 10.0000 mg | INTRAMUSCULAR | Status: DC | PRN

## 2024-03-18 MED ORDER — CYCLOBENZAPRINE HCL 5 MG PO TABS
10.0000 mg | ORAL_TABLET | Freq: Once | ORAL | Status: AC
  Administered 2024-03-18: 10 mg via ORAL
  Filled 2024-03-18: qty 2

## 2024-03-18 MED ORDER — METOCLOPRAMIDE HCL 10 MG PO TABS
10.0000 mg | ORAL_TABLET | Freq: Once | ORAL | Status: AC
  Administered 2024-03-18: 10 mg via ORAL
  Filled 2024-03-18: qty 1

## 2024-03-18 MED ORDER — METOCLOPRAMIDE HCL 5 MG/ML IJ SOLN
10.0000 mg | Freq: Once | INTRAMUSCULAR | Status: DC
Start: 2024-03-18 — End: 2024-03-18

## 2024-03-18 MED ORDER — LABETALOL HCL 5 MG/ML IV SOLN
20.0000 mg | INTRAVENOUS | Status: DC | PRN
  Administered 2024-03-18: 20 mg via INTRAVENOUS
  Filled 2024-03-18: qty 4

## 2024-03-18 MED ORDER — LABETALOL HCL 5 MG/ML IV SOLN: 80.0000 mg | INTRAVENOUS | Status: DC | PRN

## 2024-03-18 NOTE — MAU Provider Note (Addendum)
 S [name] is a 38 y.o. [phone] postpartum Day #9  female who presents to MAU today with complaint of  severe HA unrelieved by oxycodone  or Tylenol . She reports this HA started last night. She reports seeing spots and visual changes, she reports this started last night. She reports nothing makes it better or worse. Her last dose of Tylenol  was last night.   She has her breastfeeding infant with her.   She reports her HA pain as 10/10.  Receives care at [facility]. Prenatal records reviewed.  Pertinent items noted in HPI and remainder of comprehensive ROS otherwise negative.   O BP (!) 150/73 (BP Location: Right Arm)   Pulse 62   Temp 98.3 F (36.8 C) (Oral)   Resp 19   SpO2 99%  Physical Exam Vitals reviewed.  Constitutional:      Appearance: Normal appearance. She is ill-appearing.  HENT:     Head: Normocephalic.     Comments: Holding her head in hands. Photophobia noted.  Cardiovascular:     Rate and Rhythm: Normal rate and regular rhythm.     Pulses: Normal pulses.     Heart sounds: Normal heart sounds.  Pulmonary:     Effort: Pulmonary effort is normal.     Breath sounds: Normal breath sounds.  Skin:    General: Skin is warm and dry.     Capillary Refill: Capillary refill takes less than 2 seconds.  Neurological:     General: No focal deficit present.     Mental Status: She is alert and oriented to person, place, and time.     Motor: No weakness.     Deep Tendon Reflexes: Reflexes normal.  Psychiatric:        Mood and Affect: Mood normal.        Speech: Speech normal.        Behavior: Behavior normal.      MDM:  HIGH  Reviewed EMR PE Consulted Dr. Jayne who recommends treating headache and monitoring labs for PEC.  Labs negative. However, HA refractory to fioricet , flexeril , and remeron .  Consulted Dr. Fredirick who advises in absence of PEC labs, treat headache and blood pressures. Will obtain MRI due to new HA resistant to treatment.  Additionally treating  blood pressures with procardia  30xl BID to start. Addition of labetalol  for severe range.  Addition of roxicodone  due to HA still present shortly before transport to MRI. Patient still waiting on support person for greater than 2 hours, call made to friend to expedite treatment as patient has infant with her.    MAU Course:  ASSESSMENT  New daily persistent headache  Hypertension, unspecified type  Intractable headache, unspecified chronicity pattern, unspecified headache type  Postpartum state  Medical screening exam complete   PLAN Treatment of HA with medications. Imaging for persistent, new onset headache ( No acute pathology on CT) Treatment of blood pressures.  Follow up in the office within 1 week for BP check Continue to monitor BP's at home  Turned care of patient over to Olam Dalton, [facility] at 2155 Camie Rote, MSN, CNM 03/19/2024 3:13 AM  Certified Nurse Midwife, [facility] -----------------------------------------------------------------------------------  Received care of patient at 2155 Patient with SRBP's  while here in triage and received multiple medications for her Blood pressures and un resolved HA. She is PPD# 9. She was to have a CT scan of her head and was not able to d/t her infant child being with her and no  one to watch the child while she went for imaging and treatment   Discussed patient status  with Dr Fredirick [facility] Attending)- She is aware patient was not able to have CT  Imaging and that she still has elevated BP's with a Headache but is requesting AMA discharge d/t childcare needs  Prescription orders placed for BP control and patient will sign AMA paperwork.   Encouraged patient to return to MAU for CT imaging ASAP.  Patient has been counseled that her blood pressures are severe and that with new onset headache imaging is necessary to treat her properly.  She reports she needs to leave AMA and is aware that this may need may lead to  more complications including maternal incapacity and severe situations including possible maternal death.  Patient states she understands these risks and will try and return to the hospital tomorrow morning or soon as possible for additional treatment.    Medications as discussed with Dr. Fredirick were sent to the patient's pharmacy as stated below for blood pressure control.  Patient verbalized understanding and signed AMA paperwork  @ [phone] Patient informed me that she can stay for CT imaging and that she has arranged for childcare . Orders placed for CT   Narrative & Impression  CLINICAL DATA:  Headache, classic migraine   EXAM: CT HEAD WITHOUT AND WITH CONTRAST   TECHNIQUE: Contiguous axial images were obtained from the base of the skull through the vertex without and with intravenous contrast.   RADIATION DOSE REDUCTION: This exam was performed according to the departmental dose-optimization program which includes automated exposure control, adjustment of the mA and/or kV according to patient size and/or use of iterative reconstruction technique.   CONTRAST:  75mL OMNIPAQUE  IOHEXOL  350 MG/ML SOLN   COMPARISON:  None Available.   FINDINGS: Brain: No evidence of acute infarction, hemorrhage, hydrocephalus, extra-axial collection or mass lesion/mass effect. No abnormal enhancement.   Vascular: No hyperdense vessel.   Skull: No acute fracture.   Sinuses/Orbits: No acute abnormality.   IMPRESSION: No evidence of acute intracranial abnormality.     Electronically Signed   By: Gilmore GORMAN Molt M.D.   On: 03/19/2024 02:45      PLAN Treatment of HA with medications. Imaging for persistent, new onset headache ( No acute pathology on CT) Treatment of blood pressures.  Follow up in the office within 1 week for BP check Continue to monitor BP's at home Stable for discharge    Allergies as of 03/19/2024   No Known Allergies      Medication List     TAKE these  medications    ACCRUFeR  30 MG Caps Generic drug: Ferric Maltol  Take 1 capsule (30 mg total) by mouth daily.   acetaminophen  500 MG tablet Commonly known as: TYLENOL  Take 2 tablets (1,000 mg total) by mouth every 6 (six) hours as needed.   furosemide  20 MG tablet Commonly known as: Lasix  Take 1 tablet (20 mg total) by mouth 2 (two) times daily for 5 days.   gabapentin  100 MG capsule Commonly known as: NEURONTIN  Take 3 capsules (300 mg total) by mouth 3 (three) times daily.   ibuprofen  600 MG tablet Commonly known as: ADVIL  Take 1 tablet (600 mg total) by mouth every 6 (six) hours as needed.   NIFEdipine  60 MG 24 hr tablet Commonly known as: PROCARDIA  XL/NIFEDICAL XL Take 1 tablet (60 mg total) by mouth 2 (two) times daily.   oxyCODONE  5 MG immediate release tablet Commonly known as:  Oxy IR/ROXICODONE  Take 1 tablet (5 mg total) by mouth every 4 (four) hours as needed for breakthrough pain.   polyethylene glycol powder 17 GM/SCOOP powder Commonly known as: GLYCOLAX /MIRALAX  Take 17 g by mouth daily as needed.   potassium chloride  SA 20 MEQ tablet Commonly known as: KLOR-CON  M Take 1 tablet (20 mEq total) by mouth daily for 5 days.   PrePLUS 27-1 MG Tabs Take 1 tablet by mouth daily.   propranolol  10 MG tablet Commonly known as: INDERAL  Take 0.5 tablets (5 mg total) by mouth daily.        Olam Dalton, MSN, [facility], [facility] for [employer]

## 2024-03-18 NOTE — MAU Note (Signed)
 Linda Duffy is a 38 y.o. at Unknown here in MAU reporting: Since last night she has had a headache that will not go away. Reports seeing spots, burred vision and dizziness. Took oxy at 0730 this am with no relief. States it does not matter what she does it hasn't gone away. Had an elevated BP reading in office 2 days ago but was normal when they repeated it. Reporting nausea. Neck pain at the base of her skull. Pt did not drive herself, used [employer].  Denies any abdominal pain and says vaginal bleeding is fine.   Onset of complaint: last night  Pain score: HA 10   Vitals:   03/18/24 1422  BP: (!) 154/84  Pulse: (!) 58  Resp: 18  Temp: 98 F (36.7 C)     Lab orders placed from triage:

## 2024-03-18 NOTE — MAU Provider Note (Incomplete Revision)
 None     S [name] is a 38 y.o. [phone] postpartum female at Unknown who presents to MAU today with complaint of  severe HA unrelieved by oxycodone  or Tylenol . She reports this HA started last night. She reports seeing spots and visual changes, she reports this started last night. She reports nothing makes it better or worse. Her last dose of Tylenol  was last night.   She has her breastfeeding infant with her.   She reports her HA pain as 10/10.  Receives care at [facility]. Prenatal records reviewed.  Pertinent items noted in HPI and remainder of comprehensive ROS otherwise negative.   O BP (!) 146/71   Pulse (!) 59   Temp 98.3 F (36.8 C) (Oral)   Resp 18   SpO2 100%  Physical Exam Vitals reviewed.  Constitutional:      Appearance: Normal appearance. She is ill-appearing.  HENT:     Head: Normocephalic.     Comments: Holding her head in hands. Photophobia noted.  Cardiovascular:     Rate and Rhythm: Normal rate and regular rhythm.     Pulses: Normal pulses.     Heart sounds: Normal heart sounds.  Pulmonary:     Effort: Pulmonary effort is normal.     Breath sounds: Normal breath sounds.  Skin:    General: Skin is warm and dry.     Capillary Refill: Capillary refill takes less than 2 seconds.  Neurological:     General: No focal deficit present.     Mental Status: She is alert and oriented to person, place, and time.     Motor: No weakness.     Deep Tendon Reflexes: Reflexes normal.  Psychiatric:        Mood and Affect: Mood normal.        Speech: Speech normal.        Behavior: Behavior normal.      MDM: Moderate Reviewed EMR PE Consulted Dr. Jayne who recommends treating headache and monitoring labs for PEC.  Labs negative. However, HA refractory to fioricet, flexeril, and remeron.  Consulted Dr. Fredirick who advises in absence of PEC labs, treat headache and blood pressures. Will obtain MRI due to new HA resistant to treatment.  Additionally treating blood  pressures with procardia 30xl BID to start. Addition of labetalol for severe range.  Addition of roxicodone  due to HA still present shortly before transport to MRI. Patient still waiting on support person for greater than 2 hours, call made to friend to expedite treatment as patient has infant with her.   MAU Course:  A New daily persistent headache  Hypertension, unspecified type  Medical screening exam complete   P Treatment of HA with medications. Imaging for persistent, new onset headache Treatment of blood pressures.   Turned care of patient over to Linda Duffy, [facility] at [address] Rote, MSN, CNM 03/18/2024 9:55 PM  Certified Nurse Midwife, [facility]

## 2024-03-19 ENCOUNTER — Inpatient Hospital Stay (HOSPITAL_COMMUNITY)

## 2024-03-19 DIAGNOSIS — I10 Essential (primary) hypertension: Secondary | ICD-10-CM

## 2024-03-19 DIAGNOSIS — R519 Headache, unspecified: Secondary | ICD-10-CM

## 2024-03-19 MED ORDER — FUROSEMIDE 20 MG PO TABS: 20.0000 mg | ORAL_TABLET | Freq: Two times a day (BID) | ORAL | 0 refills | Status: AC

## 2024-03-19 MED ORDER — IOHEXOL 350 MG/ML SOLN
75.0000 mL | Freq: Once | INTRAVENOUS | Status: AC | PRN
  Administered 2024-03-19: 75 mL via INTRAVENOUS

## 2024-03-19 MED ORDER — POTASSIUM CHLORIDE CRYS ER 20 MEQ PO TBCR: 20.0000 meq | EXTENDED_RELEASE_TABLET | Freq: Every day | ORAL | 0 refills | Status: AC

## 2024-03-19 MED ORDER — NIFEDIPINE ER OSMOTIC RELEASE 60 MG PO TB24: 60.0000 mg | ORAL_TABLET | Freq: Two times a day (BID) | ORAL | 0 refills | Status: DC

## 2024-03-19 MED ORDER — PROPRANOLOL HCL 10 MG PO TABS: 5.0000 mg | ORAL_TABLET | Freq: Every day | ORAL | 0 refills | Status: AC

## 2024-03-19 NOTE — Discharge Instructions (Signed)
 Follow up with your [facility] Provider

## 2024-03-21 ENCOUNTER — Encounter: Payer: Self-pay | Admitting: Family Medicine

## 2024-03-23 ENCOUNTER — Other Ambulatory Visit: Payer: Self-pay

## 2024-03-23 ENCOUNTER — Inpatient Hospital Stay (HOSPITAL_COMMUNITY)
Admission: AD | Admit: 2024-03-23 | Discharge: 2024-03-25 | DRG: 776 | Disposition: A | Discharge status: Home / Self Care | Attending: Obstetrics & Gynecology | Admitting: Obstetrics & Gynecology

## 2024-03-23 ENCOUNTER — Encounter (HOSPITAL_COMMUNITY): Payer: Self-pay | Admitting: Obstetrics & Gynecology

## 2024-03-23 DIAGNOSIS — Z8249 Family history of ischemic heart disease and other diseases of the circulatory system: Secondary | ICD-10-CM | POA: Diagnosis not present

## 2024-03-23 DIAGNOSIS — Z98891 History of uterine scar from previous surgery: Principal | ICD-10-CM

## 2024-03-23 DIAGNOSIS — Z79899 Other long term (current) drug therapy: Secondary | ICD-10-CM | POA: Diagnosis not present

## 2024-03-23 DIAGNOSIS — Z87891 Personal history of nicotine dependence: Secondary | ICD-10-CM

## 2024-03-23 DIAGNOSIS — Z833 Family history of diabetes mellitus: Secondary | ICD-10-CM

## 2024-03-23 DIAGNOSIS — O1415 Severe pre-eclampsia, complicating the puerperium: Principal | ICD-10-CM | POA: Diagnosis present

## 2024-03-23 DIAGNOSIS — O1495 Unspecified pre-eclampsia, complicating the puerperium: Principal | ICD-10-CM | POA: Diagnosis present

## 2024-03-23 MED ORDER — ACETAMINOPHEN-CAFFEINE 500-65 MG PO TABS
2.0000 | ORAL_TABLET | Freq: Once | ORAL | Status: AC
  Administered 2024-03-23: 2 via ORAL
  Filled 2024-03-23: qty 2

## 2024-03-23 NOTE — MAU Note (Signed)
 MAU Triage Note  Linda Duffy is a 38 y.o. a postpartum patient here in MAU reporting: chest pain that feels like fluttering and SOB, she also reports dizziness and a HA since 0700. Reports she took at oxy at 1245, and it didn't relieve any pain. She hasn't taken anything else for pain. Reports she has a dx of preeclampsia. Took lasix  and procardia  around 1700.   Onset of complaint: 0700 Pain score: HA 10; chest 5/10 Vitals:   03/23/24 2219  BP: (!) 152/91  Pulse: 93  Resp: 20  Temp: 98 F (36.7 C)  SpO2: 99%      Lab orders placed from triage: EKG

## 2024-03-23 NOTE — MAU Provider Note (Signed)
 Chief Complaint:  Headache and Chest Pain   Event Date/Time   First Provider Initiated Contact with Patient 03/23/24 2255       HPI: Linda Duffy is a 38 y.o. [id number] who is 2 weeks post Cesarean who presents to maternity admissions reporting Chest pain and pressure with shortness of breath.  Also has headache that was unrelieved by Tylenol .  .or Oxy.  She dizziness, n/v, or fever/chills.    HPI RN Note: Linda Duffy is a 38 y.o. a postpartum patient here in MAU reporting: chest pain that feels like fluttering and SOB, she also reports dizziness and a HA since 0700. Reports she took at oxy at 1245, and it didn't relieve any pain. She hasn't taken anything else for pain. Reports she has a dx of preeclampsia. Took lasix  and procardia  around 1700.   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   History of postpartum hypertension    History of thrombosis of lower extremity    Ovarian cyst 2012   Pyelonephritis    Sickle cell trait (HCC)    Vaginal Pap smear, abnormal     Past obstetric history: OB History  Gravida Para Term Preterm AB Living  9 6 6  0 3 6  SAB IAB Ectopic Multiple Live Births  1 2 0 0 6    # Outcome Date GA Lbr Len/2nd Weight Sex Type Anes PTL Lv  9 Term 03/07/24 [redacted]w[redacted]d  3200 g M CS-LTranv Spinal  LIV  8 Term 11/10/19 [redacted]w[redacted]d  2775 g F CS-LTranv Spinal  LIV  7 Term 02/17/18 [redacted]w[redacted]d  2722 g F CS-Unspec Spinal  LIV     Birth Comments: repeat c/s , anemia had blood clots in legs post op  6 Term 07/04/16 [redacted]w[redacted]d  3260 g M CS-LTranv Spinal  LIV     Birth Comments: no complications except anemic  5 Term 05/24/15 [redacted]w[redacted]d  2665 g F CS-Vac Spinal  LIV     Birth Comments: Normal exam.  4 IAB 2006          3 IAB 2005          2 Term 07/04/03 [redacted]w[redacted]d  3317 g F  CS-LTranv None N LIV     Birth Comments: c/s FTP  1 SAB             Past Surgical History: Past Surgical History:  Procedure Laterality Date   CERVIX LESION DESTRUCTION     after 1st c-section and nl paps after that   CESAREAN SECTION     x1   CESAREAN SECTION N/A 05/24/2015   Procedure: REPEAT CESAREAN SECTION;  Surgeon: Aida DELENA Na, MD;  Location: [facility] ORS;  Service: Obstetrics;  Laterality: N/A;   CESAREAN SECTION N/A 07/04/2016   Procedure: CESAREAN SECTION;  Surgeon: Elveria Mungo, MD;  Location: [facility] BIRTHING SUITES;  Service: Obstetrics;  Laterality: N/A;   CESAREAN SECTION N/A 02/17/2018   Procedure: REPEAT CESAREAN SECTION;  Surgeon: Kandis Devaughn Sayres, MD;  Location: [facility] BIRTHING SUITES;  Service: Obstetrics;  Laterality: N/A;   CESAREAN SECTION N/A 11/10/2019   Procedure: CESAREAN SECTION;  Surgeon: Alger Gong, MD;  Location: [facility] LD ORS;  Service: Obstetrics;  Laterality: N/A;   CESAREAN SECTION N/A 03/07/2024  Procedure: CESAREAN DELIVERY;  Surgeon: Barbra Lang PARAS, DO;  Location: [facility] LD ORS;  Service: Obstetrics;  Laterality: N/A;   DILATION AND CURETTAGE OF UTERUS     x2 for EABs    Family History: Family History  Problem Relation Age of Onset   Diabetes Mother    Hypertension Mother    Heart disease Mother    Asthma Mother     Social History: Social History   Tobacco Use   Smoking status: Former    Current packs/day: 0.00    Types: Cigarettes    Quit date: 09/18/2014    Years since quitting: 9.5   Smokeless tobacco: Never  Vaping Use   Vaping status: Never Used  Substance Use Topics   Alcohol use: No   Drug use: Not Currently    Types: Marijuana    Comment: not since she found out she was pregnant    Allergies: No Known Allergies  Meds:  Medications Prior to Admission  Medication Sig Dispense Refill Last Dose/Taking   furosemide  (LASIX ) 20 MG tablet Take 1 tablet (20 mg total) by mouth 2 (two) times daily for 5 days. 10 tablet 0  03/22/2024 at  5:00 PM   NIFEdipine  (PROCARDIA  XL/NIFEDICAL XL) 60 MG 24 hr tablet Take 1 tablet (60 mg total) by mouth 2 (two) times daily. 60 tablet 0 03/23/2024 at  5:00 PM   oxyCODONE  (OXY IR/ROXICODONE ) 5 MG immediate release tablet Take 1 tablet (5 mg total) by mouth every 4 (four) hours as needed for breakthrough pain. 30 tablet 0 03/23/2024 at 12:45 PM   acetaminophen  (TYLENOL ) 500 MG tablet Take 2 tablets (1,000 mg total) by mouth every 6 (six) hours as needed. (Patient not taking: Reported on 03/15/2024)      Ferric Maltol  (ACCRUFER ) 30 MG CAPS Take 1 capsule (30 mg total) by mouth daily. 30 capsule 5    gabapentin  (NEURONTIN ) 100 MG capsule Take 3 capsules (300 mg total) by mouth 3 (three) times daily. 30 capsule 0    ibuprofen  (ADVIL ) 600 MG tablet Take 1 tablet (600 mg total) by mouth every 6 (six) hours as needed. 30 tablet 1    polyethylene glycol powder (GLYCOLAX /MIRALAX ) 17 GM/SCOOP powder Take 17 g by mouth daily as needed. 510 g 1    potassium chloride  SA (KLOR-CON  M) 20 MEQ tablet Take 1 tablet (20 mEq total) by mouth daily for 5 days. 5 tablet 0    Prenatal Vit-Fe Fumarate-FA (PREPLUS) 27-1 MG TABS Take 1 tablet by mouth daily. 30 tablet 13    propranolol  (INDERAL ) 10 MG tablet Take 0.5 tablets (5 mg total) by mouth daily. 15 tablet 0     I have reviewed patient's Past Medical Hx, Surgical Hx, Family Hx, Social Hx, medications and allergies.  ROS:  Review of Systems  Constitutional:  Negative for chills and fever.  HENT:  Negative for congestion, rhinorrhea, sinus pressure, sneezing and sore throat.   Respiratory:  Positive for chest tightness and shortness of breath.   Cardiovascular:  Positive for chest pain and palpitations. Negative for leg swelling.  Gastrointestinal:  Negative for abdominal pain and nausea.   Other systems negative     Physical Exam  Patient Vitals for the past 24 hrs:  BP Temp Temp src Pulse Resp SpO2  03/23/24 2230 131/87 -- -- 90 -- 100 %   03/23/24 2219 (!) 152/91 98 F (36.7 C) Oral 93 20 99 %   Vitals:   03/24/24 0030 03/24/24 0045 03/24/24 0100  03/24/24 0156  BP: 121/80 135/76 134/78 137/89   03/24/24 0218 03/24/24 0228 03/24/24 0300 03/24/24 0335  BP: 139/88 133/87 136/88 (!) 129/97   03/24/24 0422 03/24/24 0528 03/24/24 0631 03/24/24 0740  BP: (!) 141/87 135/88 119/81 133/84    Constitutional: Well-developed, well-nourished female in no acute distress.  Cardiovascular: normal rate and rhythm, no ectopy audible, S1 & S2 heard, no murmur Respiratory: normal effort, no distress. Lungs CTAB with no wheezes or crackles GI: Abd soft, non-tender.  Nondistended.  No rebound, No guarding.  Bowel Sounds audible  MS: Extremities nontender, no edema, normal ROM Neurologic: Alert and oriented x 4.   Grossly nonfocal. GU: Neg CVAT. Skin:  Warm and Dry Psych:  Affect appropriate.   Labs: Results for orders placed or performed during the hospital encounter of 03/23/24 (from the past 24 hours)  D-dimer, quantitative     Status: Abnormal   Collection Time: 03/24/24 12:07 AM  Result Value Ref Range   D-Dimer, Quant 0.73 (H) 0.00 - 0.50 ug/mL-FEU  Troponin I (High Sensitivity)     Status: None   Collection Time: 03/24/24 12:07 AM  Result Value Ref Range   Troponin I (High Sensitivity) 4 <18 ng/L  Brain natriuretic peptide     Status: None   Collection Time: 03/24/24 12:07 AM  Result Value Ref Range   B Natriuretic Peptide 35.8 0.0 - 100.0 pg/mL  CBC     Status: None   Collection Time: 03/24/24  7:41 AM  Result Value Ref Range   WBC 7.5 4.0 - 10.5 K/uL   RBC 4.17 3.87 - 5.11 MIL/uL   Hemoglobin 13.3 12.0 - 15.0 g/dL   HCT 61.0 63.9 - 53.9 %   MCV 93.3 80.0 - 100.0 fL   MCH 31.9 26.0 - 34.0 pg   MCHC 34.2 30.0 - 36.0 g/dL   RDW 85.7 88.4 - 84.4 %   Platelets 324 150 - 400 K/uL   nRBC 0.0 0.0 - 0.2 %    --/--/O POS (07/11 9041)  Imaging:  CT Head W or Wo Contrast Result Date: 03/19/2024 CLINICAL DATA:  Headache,  classic migraine EXAM: CT HEAD WITHOUT AND WITH CONTRAST TECHNIQUE: Contiguous axial images were obtained from the base of the skull through the vertex without and with intravenous contrast. RADIATION DOSE REDUCTION: This exam was performed according to the departmental dose-optimization program which includes automated exposure control, adjustment of the mA and/or kV according to patient size and/or use of iterative reconstruction technique. CONTRAST:  75mL OMNIPAQUE  IOHEXOL  350 MG/ML SOLN COMPARISON:  None Available. FINDINGS: Brain: No evidence of acute infarction, hemorrhage, hydrocephalus, extra-axial collection or mass lesion/mass effect. No abnormal enhancement. Vascular: No hyperdense vessel. Skull: No acute fracture. Sinuses/Orbits: No acute abnormality. IMPRESSION: No evidence of acute intracranial abnormality. Electronically Signed   By: Gilmore GORMAN Molt M.D.   On: 03/19/2024 02:45   EKG done, NSR  CT Head W or Wo Contrast Result Date: 03/24/2024 CLINICAL DATA:  Headache, increasing frequency or severity Headache, new or worsening (pregnant) Headache, sudden, severe pREECLAMPSIA IWTH HEADACHE UNRELIEVED BY MEDICATION EXAM: CT HEAD WITHOUT AND WITH CONTRAST TECHNIQUE: Contiguous axial images were obtained from the base of the skull through the vertex without and with intravenous contrast. RADIATION DOSE REDUCTION: This exam was performed according to the departmental dose-optimization program which includes automated exposure control, adjustment of the mA and/or kV according to patient size and/or use of iterative reconstruction technique. CONTRAST:  75mL OMNIPAQUE  IOHEXOL  350 MG/ML SOLN COMPARISON:  CT  head 03/19/2024. FINDINGS: Brain: No evidence of acute infarction, hemorrhage, hydrocephalus, extra-axial collection or mass lesion/mass effect. Vascular: No hyperdense vessel or unexpected calcification. Visible vessels are patent. Skull: No acute fracture. Sinuses/Orbits: No acute finding. Other: No  mastoid effusions. IMPRESSION: No evidence of acute intracranial abnormality. Electronically Signed   By: Gilmore GORMAN Molt M.D.   On: 03/24/2024 02:13   CT Angio Chest PE W and/or Wo Contrast Result Date: 03/24/2024 CLINICAL DATA:  Chest pain, dyspnea, severe preeclampsia, postpartum EXAM: CT ANGIOGRAPHY CHEST WITH CONTRAST TECHNIQUE: Multidetector CT imaging of the chest was performed using the standard protocol during bolus administration of intravenous contrast. Multiplanar CT image reconstructions and MIPs were obtained to evaluate the vascular anatomy. RADIATION DOSE REDUCTION: This exam was performed according to the departmental dose-optimization program which includes automated exposure control, adjustment of the mA and/or kV according to patient size and/or use of iterative reconstruction technique. CONTRAST:  75mL OMNIPAQUE  IOHEXOL  350 MG/ML SOLN COMPARISON:  03/01/2018 FINDINGS: Cardiovascular: Adequate opacification of the pulmonary arterial tree. No intraluminal filling defect identified to suggest acute pulmonary embolism. Central pulmonary arteries are of normal caliber. No significant coronary artery calcification. Cardiac size is mildly enlarged, stable since prior examination. No pericardial effusion. Mild atherosclerotic calcification within the thoracic aorta. No aortic aneurysm. Mediastinum/Nodes: No enlarged mediastinal, hilar, or axillary lymph nodes. Thyroid  gland, trachea, and esophagus demonstrate no significant findings. Lungs/Pleura: Lungs are clear. No pleural effusion or pneumothorax. Upper Abdomen: No acute abnormality. Musculoskeletal: No chest wall abnormality. No acute or significant osseous findings. Review of the MIP images confirms the above findings. IMPRESSION: 1. No acute pulmonary embolism. No acute intrathoracic pathology identified 2. Stable mild cardiomegaly. Aortic Atherosclerosis (ICD10-I70.0). Electronically Signed   By: Dorethia Molt M.D.   On: 03/24/2024 01:57    CT Head W or Wo Contrast Result Date: 03/19/2024 CLINICAL DATA:  Headache, classic migraine EXAM: CT HEAD WITHOUT AND WITH CONTRAST TECHNIQUE: Contiguous axial images were obtained from the base of the skull through the vertex without and with intravenous contrast. RADIATION DOSE REDUCTION: This exam was performed according to the departmental dose-optimization program which includes automated exposure control, adjustment of the mA and/or kV according to patient size and/or use of iterative reconstruction technique. CONTRAST:  75mL OMNIPAQUE  IOHEXOL  350 MG/ML SOLN COMPARISON:  None Available. FINDINGS: Brain: No evidence of acute infarction, hemorrhage, hydrocephalus, extra-axial collection or mass lesion/mass effect. No abnormal enhancement. Vascular: No hyperdense vessel. Skull: No acute fracture. Sinuses/Orbits: No acute abnormality. IMPRESSION: No evidence of acute intracranial abnormality. Electronically Signed   By: Gilmore GORMAN Molt M.D.   On: 03/19/2024 02:45     MAU Course/MDM: I have reviewed the triage vital signs and the nursing notes.   Pertinent labs & imaging results that were available during my care of the patient were reviewed by me and considered in my medical decision making (see chart for details).      I have reviewed her medical records including past results, notes and treatments.   I have ordered labs as follows: Imaging ordered: CT of head and CT ANgiogram. Results reviewed. D=Dimer elevated.   Consult DR Eveline who recommends Mag Sulfate. .   Treatments in MAU included Magnesium  Sulfate ordered by Dr Macario dor severe features. .   Pt stable at time of discharge.  Assessment: Postpartum preeclampsia with severe feature. (H/A)  Plan: Transferred to floor after CTs completed for Magnesium  sulfate infusion.   Earnie Pouch CNM, MSN Certified Nurse-Midwife 03/23/2024 10:55 PM

## 2024-03-24 ENCOUNTER — Inpatient Hospital Stay (HOSPITAL_COMMUNITY)

## 2024-03-24 DIAGNOSIS — I517 Cardiomegaly: Secondary | ICD-10-CM | POA: Diagnosis not present

## 2024-03-24 DIAGNOSIS — R519 Headache, unspecified: Secondary | ICD-10-CM | POA: Diagnosis not present

## 2024-03-24 DIAGNOSIS — R079 Chest pain, unspecified: Secondary | ICD-10-CM | POA: Diagnosis not present

## 2024-03-24 DIAGNOSIS — O1495 Unspecified pre-eclampsia, complicating the puerperium: Secondary | ICD-10-CM | POA: Diagnosis not present

## 2024-03-24 DIAGNOSIS — Z833 Family history of diabetes mellitus: Secondary | ICD-10-CM | POA: Diagnosis not present

## 2024-03-24 DIAGNOSIS — Z8249 Family history of ischemic heart disease and other diseases of the circulatory system: Secondary | ICD-10-CM | POA: Diagnosis not present

## 2024-03-24 DIAGNOSIS — I700 Atherosclerosis of aorta: Secondary | ICD-10-CM | POA: Diagnosis not present

## 2024-03-24 DIAGNOSIS — Z87891 Personal history of nicotine dependence: Secondary | ICD-10-CM | POA: Diagnosis not present

## 2024-03-24 DIAGNOSIS — Z79899 Other long term (current) drug therapy: Secondary | ICD-10-CM | POA: Diagnosis not present

## 2024-03-24 DIAGNOSIS — O1415 Severe pre-eclampsia, complicating the puerperium: Secondary | ICD-10-CM | POA: Diagnosis not present

## 2024-03-24 LAB — CBC
HCT: 38.9 % (ref 36.0–46.0)
Hemoglobin: 13.3 g/dL (ref 12.0–15.0)
MCH: 31.9 pg (ref 26.0–34.0)
MCHC: 34.2 g/dL (ref 30.0–36.0)
MCV: 93.3 fL (ref 80.0–100.0)
Platelets: 324 K/uL (ref 150–400)
RBC: 4.17 MIL/uL (ref 3.87–5.11)
RDW: 14.2 % (ref 11.5–15.5)
WBC: 7.5 K/uL (ref 4.0–10.5)
nRBC: 0 % (ref 0.0–0.2)

## 2024-03-24 LAB — COMPREHENSIVE METABOLIC PANEL WITH GFR
ALT: 13 U/L (ref 0–44)
AST: 15 U/L (ref 15–41)
Albumin: 4 g/dL (ref 3.5–5.0)
Alkaline Phosphatase: 111 U/L (ref 38–126)
Anion gap: 13 (ref 5–15)
BUN: 13 mg/dL (ref 6–20)
CO2: 21 mmol/L — ABNORMAL LOW (ref 22–32)
Calcium: 9 mg/dL (ref 8.9–10.3)
Chloride: 101 mmol/L (ref 98–111)
Creatinine, Ser: 0.87 mg/dL (ref 0.44–1.00)
GFR, Estimated: >60 mL/min (ref >60)
Glucose, Bld: 120 mg/dL — ABNORMAL HIGH (ref 70–99)
Potassium: 4.6 mmol/L (ref 3.5–5.1)
Sodium: 135 mmol/L (ref 135–145)
Total Bilirubin: 0.9 mg/dL (ref 0.0–1.2)
Total Protein: 7.6 g/dL (ref 6.5–8.1)

## 2024-03-24 LAB — D-DIMER, QUANTITATIVE: D-Dimer, Quant: 0.73 ug{FEU}/mL — ABNORMAL HIGH (ref 0.00–0.50)

## 2024-03-24 LAB — BRAIN NATRIURETIC PEPTIDE: B Natriuretic Peptide: 35.8 pg/mL (ref 0.0–100.0)

## 2024-03-24 LAB — TROPONIN I (HIGH SENSITIVITY): Troponin I (High Sensitivity): 4 ng/L (ref <18)

## 2024-03-24 MED ORDER — LABETALOL HCL 5 MG/ML IV SOLN: 80.0000 mg | INTRAVENOUS | Status: DC | PRN

## 2024-03-24 MED ORDER — MAGNESIUM SULFATE 40 GM/1000ML IV SOLN
2.0000 g/h | INTRAVENOUS | Status: AC
  Administered 2024-03-24 (×2): 2 g/h via INTRAVENOUS
  Filled 2024-03-24 (×2): qty 1000

## 2024-03-24 MED ORDER — MAGNESIUM SULFATE BOLUS VIA INFUSION
4.0000 g | Freq: Once | INTRAVENOUS | Status: AC
  Administered 2024-03-24: 4 g via INTRAVENOUS
  Filled 2024-03-24: qty 1000

## 2024-03-24 MED ORDER — PRENATAL MULTIVITAMIN CH
1.0000 | ORAL_TABLET | Freq: Every day | ORAL | Status: DC
  Administered 2024-03-24: 1 via ORAL
  Filled 2024-03-24: qty 1

## 2024-03-24 MED ORDER — ACETAMINOPHEN 325 MG PO TABS: 650.0000 mg | ORAL_TABLET | Freq: Once | ORAL | Status: DC

## 2024-03-24 MED ORDER — IBUPROFEN 600 MG PO TABS
600.0000 mg | ORAL_TABLET | Freq: Four times a day (QID) | ORAL | Status: DC | PRN
  Administered 2024-03-24 – 2024-03-25 (×2): 600 mg via ORAL
  Filled 2024-03-24 (×2): qty 1

## 2024-03-24 MED ORDER — ZOLPIDEM TARTRATE 5 MG PO TABS
5.0000 mg | ORAL_TABLET | Freq: Every evening | ORAL | Status: DC | PRN
Start: 2024-03-24 — End: 2024-03-25

## 2024-03-24 MED ORDER — BUTALBITAL-APAP-CAFFEINE 50-325-40 MG PO TABS
2.0000 | ORAL_TABLET | Freq: Once | ORAL | Status: AC
  Administered 2024-03-24: 2 via ORAL
  Filled 2024-03-24: qty 2

## 2024-03-24 MED ORDER — LACTATED RINGERS IV SOLN: INTRAVENOUS | Status: AC

## 2024-03-24 MED ORDER — LABETALOL HCL 5 MG/ML IV SOLN: 20.0000 mg | INTRAVENOUS | Status: DC | PRN

## 2024-03-24 MED ORDER — ACETAMINOPHEN 325 MG PO TABS: 650.0000 mg | ORAL_TABLET | Freq: Four times a day (QID) | ORAL | Status: DC | PRN

## 2024-03-24 MED ORDER — LABETALOL HCL 5 MG/ML IV SOLN: 40.0000 mg | INTRAVENOUS | Status: DC | PRN

## 2024-03-24 MED ORDER — IOHEXOL 350 MG/ML SOLN
75.0000 mL | Freq: Once | INTRAVENOUS | Status: AC | PRN
  Administered 2024-03-24: 75 mL via INTRAVENOUS

## 2024-03-24 MED ORDER — HYDRALAZINE HCL 20 MG/ML IJ SOLN: 10.0000 mg | INTRAMUSCULAR | Status: DC | PRN

## 2024-03-24 NOTE — H&P (Signed)
 Linda Duffy is an 38 y.o. female. [id number] She is 17 days postoperative from a repeat cesarean section and she presents with elevated BP, headache, intermittent CP and SOB. She took her oxycodone  with no relief. She was offered admission for headache and preeclampsia 7/25 but she declined then. She accepts having complete evaluation and treatment including magnesium .      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   History of postpartum hypertension    History of thrombosis of lower extremity    Ovarian cyst 2012   Pyelonephritis    Sickle cell trait (HCC)    Vaginal Pap smear, abnormal     Past Surgical History:  Procedure Laterality Date   CERVIX LESION DESTRUCTION     after 1st c-section and nl paps after that   CESAREAN SECTION     x1   CESAREAN SECTION N/A 05/24/2015   Procedure: REPEAT CESAREAN SECTION;  Surgeon: Aida DELENA Na, MD;  Location: [facility] ORS;  Service: Obstetrics;  Laterality: N/A;   CESAREAN SECTION N/A 07/04/2016   Procedure: CESAREAN SECTION;  Surgeon: Elveria Mungo, MD;  Location: [facility] BIRTHING SUITES;  Service: Obstetrics;  Laterality: N/A;   CESAREAN SECTION N/A 02/17/2018   Procedure: REPEAT CESAREAN SECTION;  Surgeon: Kandis Devaughn Sayres, MD;  Location: [facility] BIRTHING SUITES;  Service: Obstetrics;  Laterality: N/A;   CESAREAN SECTION N/A 11/10/2019   Procedure: CESAREAN SECTION;  Surgeon: Alger Gong, MD;  Location: [facility] LD ORS;  Service: Obstetrics;  Laterality: N/A;   CESAREAN SECTION N/A 03/07/2024   Procedure: CESAREAN DELIVERY;  Surgeon: Barbra Lang PARAS, DO;  Location: [facility] LD OR[facility];  Service: Obstetrics;  Laterality: N/A;   DILATION AND CURETTAGE OF UTERUS     x2 for EABs    Family History  Problem Relation Age of Onset   Diabetes Mother    Hypertension  Mother    Heart disease Mother    Asthma Mother     Social History:  reports that she quit smoking about 9 years ago. Her smoking use included cigarettes. She has never used smokeless tobacco. She reports that she does not currently use drugs after having used the following drugs: Marijuana. She reports that she does not drink alcohol.  Allergies: No Known Allergies  Medications Prior to Admission  Medication Sig Dispense Refill Last Dose/Taking   furosemide  (LASIX ) 20 MG tablet Take 1 tablet (20 mg total) by mouth 2 (two) times daily for 5 days. 10 tablet 0 03/22/2024 at  5:00 PM   NIFEdipine  (PROCARDIA  XL/NIFEDICAL XL) 60 MG 24 hr tablet Take 1 tablet (60 mg total) by mouth 2 (two) times daily. 60 tablet 0 03/23/2024 at  5:00 PM   oxyCODONE  (OXY IR/ROXICODONE ) 5 MG immediate release tablet Take 1 tablet (5 mg total) by mouth every 4 (four) hours as needed for breakthrough pain. 30 tablet 0 03/23/2024 at 12:45 PM   acetaminophen  (TYLENOL ) 500 MG tablet Take 2 tablets (1,000 mg total) by mouth every 6 (six) hours as needed. (Patient not taking: Reported on 03/15/2024)      Ferric Maltol  (ACCRUFER ) 30 MG CAPS Take 1 capsule (30 mg total) by mouth daily. 30 capsule 5    gabapentin  (NEURONTIN ) 100 MG capsule Take 3 capsules (300 mg total) by  mouth 3 (three) times daily. 30 capsule 0    ibuprofen  (ADVIL ) 600 MG tablet Take 1 tablet (600 mg total) by mouth every 6 (six) hours as needed. 30 tablet 1    polyethylene glycol powder (GLYCOLAX /MIRALAX ) 17 GM/SCOOP powder Take 17 g by mouth daily as needed. 510 g 1    potassium chloride  SA (KLOR-CON  M) 20 MEQ tablet Take 1 tablet (20 mEq total) by mouth daily for 5 days. 5 tablet 0    Prenatal Vit-Fe Fumarate-FA (PREPLUS) 27-1 MG TABS Take 1 tablet by mouth daily. 30 tablet 13    propranolol  (INDERAL ) 10 MG tablet Take 0.5 tablets (5 mg total) by mouth daily. 15 tablet 0     Review of Systems  Respiratory:  Positive for shortness of breath.    Cardiovascular:  Positive for chest pain.  Neurological:  Positive for headaches.    Blood pressure 129/86, pulse 89, temperature 98 F (36.7 C), temperature source Oral, resp. rate 20, SpO2 98%, currently breastfeeding. Physical Exam Vitals and nursing note reviewed.  Constitutional:      Appearance: She is well-developed.  HENT:     Head: Normocephalic and atraumatic.  Eyes:     Extraocular Movements: Extraocular movements intact.  Cardiovascular:     Rate and Rhythm: Normal rate.  Pulmonary:     Effort: Pulmonary effort is normal.     Breath sounds: Normal breath sounds.  Abdominal:     Palpations: Abdomen is soft.  Musculoskeletal:     Cervical back: Normal range of motion.  Skin:    General: Skin is warm and dry.  Neurological:     Mental Status: She is alert.  Psychiatric:        Mood and Affect: Mood normal.        Behavior: Behavior normal.     No results found for this or any previous visit (from the past 24 hours).  No results found.  Assessment/Plan: Needs evaluation for potential thromboembolism and CTA chest is ordered and will begin magnesium  and admit to [facility].  Lynwood Solomons 03/24/2024, 12:21 AM

## 2024-03-24 NOTE — Progress Notes (Signed)
 Post Partum Day 17 Subjective: Headache is improved  Objective: Blood pressure 119/81, pulse 94, temperature 98.3 F (36.8 C), temperature source Oral, resp. rate 20, SpO2 98%, currently breastfeeding.  Physical Exam:  General: alert, cooperative, and no distress Lochia: no bleeding  Incision: n/a DVT Evaluation: No evidence of DVT seen on physical exam. Troponin, BNP nl, D dimer .73 CT chest and head nl   Assessment/Plan:  24 hr magnesium  for PP preeclampsia prophylaxis    LOS: 0 days   Lynwood Solomons, MD 03/24/2024, 7:00 AM

## 2024-03-25 ENCOUNTER — Other Ambulatory Visit (HOSPITAL_COMMUNITY): Payer: Self-pay

## 2024-03-25 MED ORDER — BUTALBITAL-APAP-CAFFEINE 50-325-40 MG PO TABS
1.0000 | ORAL_TABLET | Freq: Four times a day (QID) | ORAL | 0 refills | Status: AC | PRN
  Filled 2024-03-25: qty 20, 3d supply, fill #0

## 2024-03-25 NOTE — Discharge Summary (Signed)
 Physician Discharge Summary  Patient ID: Linda Duffy MRN: [id number] DOB/AGE: January 26, 1986 37 y.o.  Admit date: 03/23/2024 Discharge date: 03/25/2024  Admission Diagnoses:  Discharge Diagnoses:  Principal Problem:   Preeclampsia in postpartum period   Discharged Condition: stable  Hospital Course: admitted with HA/SOB ^BP  CT scans of head chest negative Received Mg SO4 x 24 hours BP stable and her symptoms resolved  Consults:   Significant Diagnostic Studies: as above  Treatments: MgSO4  Discharge Exam: Blood pressure 126/88, pulse 88, temperature 98.2 F (36.8 C), temperature source Oral, resp. rate 16, SpO2 96%, currently breastfeeding. Head: Normocephalic, without obvious abnormality, atraumatic GI: soft, non-tender; bowel sounds normal; no masses,  no organomegaly  Disposition: Discharge disposition: 01-Home or Self Care       Discharge Instructions     Activity as tolerated   Complete by: As directed    Diet - low sodium heart healthy   Complete by: As directed    Sexual activity   Complete by: As directed    No sex 6 weeks postpartum      Allergies as of 03/25/2024   No Known Allergies      Medication List     TAKE these medications    ACCRUFeR  30 MG Caps Generic drug: Ferric Maltol  Take 1 capsule (30 mg total) by mouth daily.   acetaminophen  500 MG tablet Commonly known as: TYLENOL  Take 2 tablets (1,000 mg total) by mouth every 6 (six) hours as needed.   butalbital -acetaminophen -caffeine  50-325-40 MG tablet Commonly known as: FIORICET  Take 1-2 tablets by mouth every 6 (six) hours as needed for headache.   furosemide  20 MG tablet Commonly known as: Lasix  Take 1 tablet (20 mg total) by mouth 2 (two) times daily for 5 days.   gabapentin  100 MG capsule Commonly known as: NEURONTIN  Take 3 capsules (300 mg total) by mouth 3 (three) times daily.   ibuprofen  600 MG tablet Commonly known as: ADVIL  Take 1 tablet (600 mg total) by mouth every  6 (six) hours as needed.   NIFEdipine  60 MG 24 hr tablet Commonly known as: PROCARDIA  XL/NIFEDICAL XL Take 1 tablet (60 mg total) by mouth 2 (two) times daily.   oxyCODONE  5 MG immediate release tablet Commonly known as: Oxy IR/ROXICODONE  Take 1 tablet (5 mg total) by mouth every 4 (four) hours as needed for breakthrough pain.   polyethylene glycol powder 17 GM/SCOOP powder Commonly known as: GLYCOLAX /MIRALAX  Take 17 g by mouth daily as needed.   potassium chloride  SA 20 MEQ tablet Commonly known as: KLOR-CON  M Take 1 tablet (20 mEq total) by mouth daily for 5 days.   PrePLUS 27-1 MG Tabs Take 1 tablet by mouth daily.   propranolol  10 MG tablet Commonly known as: INDERAL  Take 0.5 tablets (5 mg total) by mouth daily.        Follow-up Information     [facility] Follow up in 1 week(s).   Specialty: Obstetrics and Gynecology Why: BP check and keep scheduled postpartum appointment Contact information: [address] [place]  [address] [phone]                Signed: Vonn VEAR Inch 03/25/2024, 7:47 AM

## 2024-04-19 ENCOUNTER — Encounter: Payer: Self-pay | Admitting: Advanced Practice Midwife

## 2024-04-19 ENCOUNTER — Ambulatory Visit: Admitting: Advanced Practice Midwife

## 2024-04-19 DIAGNOSIS — Z98891 History of uterine scar from previous surgery: Secondary | ICD-10-CM

## 2024-04-19 DIAGNOSIS — O1495 Unspecified pre-eclampsia, complicating the puerperium: Secondary | ICD-10-CM

## 2024-04-19 DIAGNOSIS — Z3042 Encounter for surveillance of injectable contraceptive: Secondary | ICD-10-CM

## 2024-04-19 DIAGNOSIS — Z3202 Encounter for pregnancy test, result negative: Secondary | ICD-10-CM | POA: Diagnosis not present

## 2024-04-19 DIAGNOSIS — Z30013 Encounter for initial prescription of injectable contraceptive: Secondary | ICD-10-CM

## 2024-04-19 MED ORDER — NIFEDIPINE ER OSMOTIC RELEASE 60 MG PO TB24: 60.0000 mg | ORAL_TABLET | Freq: Two times a day (BID) | ORAL | 1 refills | Status: AC

## 2024-04-19 MED ORDER — MEDROXYPROGESTERONE ACETATE 150 MG/ML IM SUSY
150.0000 mg | PREFILLED_SYRINGE | Freq: Once | INTRAMUSCULAR | Status: AC
  Administered 2024-04-19: 150 mg via INTRAMUSCULAR

## 2024-04-19 MED ORDER — MEDROXYPROGESTERONE ACETATE 150 MG/ML IM SUSP: 150.0000 mg | INTRAMUSCULAR | 0 refills | Status: DC

## 2024-04-19 NOTE — Patient Instructions (Signed)
 [facility] to find primary care provider. Care also find this on MyChart App.

## 2024-04-19 NOTE — Progress Notes (Signed)
 Post Partum Visit Note  Linda Duffy is a 38 y.o. [phone] female who presents for a postpartum visit. She is 6 weeks postpartum following a repeat cesarean section.  I have fully reviewed the prenatal and intrapartum course. The delivery was at 39 gestational weeks.  Anesthesia: spinal. Postpartum course has been well. Baby is doing well. Baby is feeding by breast. Bleeding thin lochia. Bowel function is normal. Bladder function is normal. Patient is not sexually active. Contraception method is none. Postpartum depression screening: negative.   The pregnancy intention screening data noted above was reviewed. Potential methods of contraception were discussed. The patient elected to proceed with No data recorded.   Edinburgh Postnatal Depression Scale - 04/19/24 1022       Edinburgh Postnatal Depression Scale:  In the Past 7 Days   I have been able to laugh and see the funny side of things. 0    I have looked forward with enjoyment to things. 0    I have blamed myself unnecessarily when things went wrong. 0    I have been anxious or worried for no good reason. 0    I have felt scared or panicky for no good reason. 0    Things have been getting on top of me. 0    I have been so unhappy that I have had difficulty sleeping. 0    I have felt sad or miserable. 0    I have been so unhappy that I have been crying. 0    The thought of harming myself has occurred to me. 0    Edinburgh Postnatal Depression Scale Total 0          Health Maintenance Due  Topic Date Due   Hepatitis B Vaccines 19-59 Average Risk (1 of 3 - 19+ 3-dose series) Never done   HPV VACCINES (1 - 3-dose SCDM series) Never done   COVID-19 Vaccine (1 - 2024-25 season) Never done   INFLUENZA VACCINE  03/25/2024    The following portions of the patient's history were reviewed and updated as appropriate: allergies, current medications, past family history, past medical history, past social history, past surgical history,  and problem list.  Review of Systems Pertinent items are noted in HPI.  Objective:  BP 123/80   Pulse 75   Ht 5' 7 (1.702 m)   Wt 173 lb 6.4 oz (78.7 kg)   LMP 09/14/2023   Breastfeeding Yes   BMI 27.16 kg/m    General:  Alert, cooperative, NAD   Breasts:  not indicated  Lungs: Normal rate and effort  Heart:  Normal rate  Abdomen: Soft, NT, Fundus non-palpable  Wound NA  GU exam:  Not indicated    Assessment:    1. Postpartum care and examination - Doing well  2. Status post repeat low transverse cesarean section - Heeling well.  3. Preeclampsia in postpartum period - BP well-controlled on Procardia  60 BID. Not at a point where she should D/C. Referred to PCP for ongoing BP management. Discussed that BP usually goes back to Nml after Pre-E but that there is increased risk of developing CHTN. Discussed [facility].  - Refill Procardia  until pt can get in with PCP.    Plan:   Essential components of care per ACOG recommendations:  1.  Mood and well being: Patient with negative depression screening today. Reviewed local resources for support.  - Patient tobacco use? No.   - hx of drug use?  No.    2. Infant care and feeding:  -Patient currently breastmilk feeding? Yes. Discussed returning to work and pumping. Reviewed importance of draining breast regularly to support lactation.  -Social determinants of health (SDOH) reviewed in EPIC. No concern.  3. Sexuality, contraception and birth spacing - Patient does not want a pregnancy in the next year.  Desired family size is 6 children.  - Reviewed reproductive life planning. Reviewed contraceptive methods based on pt preferences and effectiveness.  Patient desired Hormonal Injection today.   - Discussed birth spacing of 18 months  4. Sleep and fatigue -Encouraged family/partner/community support of 4 hrs of uninterrupted sleep to help with mood and fatigue  5. Physical Recovery  - Discussed patients delivery  and complications. She describes her labor as good. - Patient had a C-section repeat; no problems after deliver. Patient had a NA laceration. Perineal healing reviewed. Patient expressed understanding. Developed PP Pre-E. Readmitted for Magnesium  Sulfate.  - Patient has urinary incontinence? No. - Patient is safe to resume physical and sexual activity  6.  Health Maintenance - HM due items addressed No - NA - Last pap smear  Diagnosis  Date Value Ref Range Status  12/15/2019   Final   - Negative for intraepithelial lesion or malignancy (NILM)   Pap smear not done at today's visit. Due in 1 year. -Breast Cancer screening indicated? No.   7. Chronic Disease/Pregnancy Condition follow up: Hypertension  - PCP follow up  Louella Medaglia  [place], [facility] for [employer], [facility]

## 2024-04-19 NOTE — Progress Notes (Signed)
 Post Partum Visit Note  Linda Duffy is a 38 y.o. [phone] female who presents for a postpartum visit. She is 6 weeks postpartum following a repeat cesarean section.  I have fully reviewed the prenatal and intrapartum course. The delivery was at 39 gestational weeks.  Anesthesia: spinal. Postpartum course has been well. Baby is doing well. Baby is feeding by breast. Bleeding thin lochia. Bowel function is normal. Bladder function is normal. Patient is not sexually active. Contraception method is none. Postpartum depression screening: negative.   The pregnancy intention screening data noted above was reviewed. Potential methods of contraception were discussed. The patient elected to proceed with No data recorded.   Edinburgh Postnatal Depression Scale - 04/19/24 1022       Edinburgh Postnatal Depression Scale:  In the Past 7 Days   I have been able to laugh and see the funny side of things. 0    I have looked forward with enjoyment to things. 0    I have blamed myself unnecessarily when things went wrong. 0    I have been anxious or worried for no good reason. 0    I have felt scared or panicky for no good reason. 0    Things have been getting on top of me. 0    I have been so unhappy that I have had difficulty sleeping. 0    I have felt sad or miserable. 0    I have been so unhappy that I have been crying. 0    The thought of harming myself has occurred to me. 0    Edinburgh Postnatal Depression Scale Total 0          Health Maintenance Due  Topic Date Due   Hepatitis B Vaccines 19-59 Average Risk (1 of 3 - 19+ 3-dose series) Never done   HPV VACCINES (1 - 3-dose SCDM series) Never done   COVID-19 Vaccine (1 - 2024-25 season) Never done   INFLUENZA VACCINE  03/25/2024    The following portions of the patient's history were reviewed and updated as appropriate: allergies, current medications, past family history, past medical history, past social history, past surgical history,  and problem list.  Review of Systems Pertinent items noted in HPI and remainder of comprehensive ROS otherwise negative.  Objective:  BP 123/80   Pulse 75   Ht 5' 7 (1.702 m)   Wt 173 lb 6.4 oz (78.7 kg)   LMP 09/14/2023   Breastfeeding Yes   BMI 27.16 kg/m    General:  alert and cooperative   Breasts:  not indicated  Lungs: clear to auscultation bilaterally  Heart:  regular rate and rhythm, S1, S2 normal, no murmur, click, rub or gallop  Abdomen: Not indicated   Wound well approximated incision  GU exam:  not indicated       Assessment:    1. Postpartum care and examination Doing well physically and emotionally. Has good support. No concerns with breastfeeding.  2. Status post repeat low transverse cesarean section Incision well approximated. Healing appropriately.  3. Preeclampsia in postpartum period Stable on Procardia  60mg   2 times per day and Inderal  5 mg daily  4. Encounter for initial prescription of injectable contraceptive Depo Provera  given today   Normal postpartum exam.   Plan:   Essential components of care per ACOG recommendations:  1.  Mood and well being: Patient with negative depression screening today. Reviewed local resources for support.  - Patient tobacco use? No.   -  hx of drug use? No    2. Infant care and feeding:  -Patient currently breastmilk feeding? Yes. Reviewed importance of draining breast regularly to support lactation.  -Social determinants of health (SDOH) reviewed in EPIC. No concerns. The following needs were identified none  3. Sexuality, contraception and birth spacing - Patient does not want a pregnancy in the next year.  Desired family size is 6 children. Does not plan to have any more children at this time - Reviewed reproductive life planning. Reviewed contraceptive methods based on pt preferences and effectiveness.  Patient desired Hormonal Injection today.   - Discussed birth spacing of 18 months  4. Sleep and  fatigue -Encouraged family/partner/community support of 4 hrs of uninterrupted sleep to help with mood and fatigue  5. Physical Recovery  - Discussed patients delivery and complications. She describes her labor as good. - Patient had a C-section, no problems at delivery. Perineal healing reviewed. Patient expressed understanding - Patient has urinary incontinence? No. - Patient is safe to resume physical and sexual activity  6.  Health Maintenance - HM due items addressed Yes - Last pap smear  Diagnosis  Date Value Ref Range Status  12/15/2019   Final   - Negative for intraepithelial lesion or malignancy (NILM)   Pap smear not done at today's visit. Patient to schedule appt for pap in 1 year. -Breast Cancer screening indicated? No.   7. Chronic Disease/Pregnancy Condition follow up: Hypertension  - PCP follow up  Derrek JINNY Freund, NP [facility] [employer], [facility]

## 2024-04-20 LAB — POCT PREGNANCY, URINE: Preg Test, Ur: NEGATIVE

## 2024-07-05 ENCOUNTER — Ambulatory Visit
